# Patient Record
Sex: Male | Born: 1940 | Race: White | Hispanic: No | Marital: Married | Smoking: Former smoker
Health system: Southern US, Community
[De-identification: ages and names within clinical notes are randomized; demographics above are authoritative.]

## PROBLEM LIST (undated history)

## (undated) DIAGNOSIS — I2510 Atherosclerotic heart disease of native coronary artery without angina pectoris: Secondary | ICD-10-CM

## (undated) DIAGNOSIS — I714 Abdominal aortic aneurysm, without rupture, unspecified: Secondary | ICD-10-CM

## (undated) DIAGNOSIS — I10 Essential (primary) hypertension: Secondary | ICD-10-CM

## (undated) DIAGNOSIS — E119 Type 2 diabetes mellitus without complications: Secondary | ICD-10-CM

## (undated) DIAGNOSIS — K567 Ileus, unspecified: Secondary | ICD-10-CM

## (undated) DIAGNOSIS — I4892 Unspecified atrial flutter: Secondary | ICD-10-CM

## (undated) DIAGNOSIS — N200 Calculus of kidney: Secondary | ICD-10-CM

## (undated) DIAGNOSIS — E785 Hyperlipidemia, unspecified: Secondary | ICD-10-CM

## (undated) DIAGNOSIS — I739 Peripheral vascular disease, unspecified: Secondary | ICD-10-CM

## (undated) DIAGNOSIS — G709 Myoneural disorder, unspecified: Secondary | ICD-10-CM

## (undated) DIAGNOSIS — I480 Paroxysmal atrial fibrillation: Secondary | ICD-10-CM

## (undated) DIAGNOSIS — M1990 Unspecified osteoarthritis, unspecified site: Secondary | ICD-10-CM

## (undated) DIAGNOSIS — G4733 Obstructive sleep apnea (adult) (pediatric): Secondary | ICD-10-CM

## (undated) DIAGNOSIS — I5042 Chronic combined systolic (congestive) and diastolic (congestive) heart failure: Secondary | ICD-10-CM

## (undated) DIAGNOSIS — I779 Disorder of arteries and arterioles, unspecified: Secondary | ICD-10-CM

## (undated) DIAGNOSIS — Z9989 Dependence on other enabling machines and devices: Secondary | ICD-10-CM

## (undated) HISTORY — DX: Essential (primary) hypertension: I10

## (undated) HISTORY — DX: Paroxysmal atrial fibrillation: I48.0

## (undated) HISTORY — DX: Myoneural disorder, unspecified: G70.9

## (undated) HISTORY — DX: Ileus, unspecified: K56.7

## (undated) HISTORY — DX: Calculus of kidney: N20.0

## (undated) HISTORY — DX: Atherosclerotic heart disease of native coronary artery without angina pectoris: I25.10

## (undated) HISTORY — DX: Hyperlipidemia, unspecified: E78.5

## (undated) HISTORY — DX: Abdominal aortic aneurysm, without rupture, unspecified: I71.40

## (undated) HISTORY — PX: SHOULDER SURGERY: SHX246

## (undated) HISTORY — DX: Unspecified atrial flutter: I48.92

## (undated) HISTORY — DX: Abdominal aortic aneurysm, without rupture: I71.4

## (undated) HISTORY — DX: Chronic combined systolic (congestive) and diastolic (congestive) heart failure: I50.42

## (undated) HISTORY — PX: TONSILLECTOMY: SUR1361

---

## 1998-02-19 ENCOUNTER — Ambulatory Visit (HOSPITAL_COMMUNITY): Admission: RE | Admit: 1998-02-19 | Discharge: 1998-02-19 | Payer: Self-pay | Admitting: Gastroenterology

## 1999-06-22 ENCOUNTER — Emergency Department (HOSPITAL_COMMUNITY): Admission: EM | Admit: 1999-06-22 | Discharge: 1999-06-22 | Payer: Self-pay | Admitting: Emergency Medicine

## 2000-04-14 ENCOUNTER — Ambulatory Visit (HOSPITAL_COMMUNITY): Admission: EM | Admit: 2000-04-14 | Discharge: 2000-04-14 | Payer: Self-pay | Admitting: *Deleted

## 2000-10-01 ENCOUNTER — Emergency Department (HOSPITAL_COMMUNITY): Admission: EM | Admit: 2000-10-01 | Discharge: 2000-10-01 | Payer: Self-pay | Admitting: Emergency Medicine

## 2000-10-01 ENCOUNTER — Encounter: Payer: Self-pay | Admitting: Emergency Medicine

## 2000-10-02 ENCOUNTER — Inpatient Hospital Stay (HOSPITAL_COMMUNITY): Admission: EM | Admit: 2000-10-02 | Discharge: 2000-10-03 | Payer: Self-pay | Admitting: Emergency Medicine

## 2000-10-02 ENCOUNTER — Encounter: Payer: Self-pay | Admitting: Urology

## 2000-10-06 ENCOUNTER — Emergency Department (HOSPITAL_COMMUNITY): Admission: EM | Admit: 2000-10-06 | Discharge: 2000-10-06 | Payer: Self-pay | Admitting: Emergency Medicine

## 2000-10-14 ENCOUNTER — Ambulatory Visit (HOSPITAL_COMMUNITY): Admission: RE | Admit: 2000-10-14 | Discharge: 2000-10-14 | Payer: Self-pay | Admitting: Urology

## 2000-10-14 ENCOUNTER — Encounter: Payer: Self-pay | Admitting: Urology

## 2000-10-17 ENCOUNTER — Ambulatory Visit (HOSPITAL_COMMUNITY): Admission: RE | Admit: 2000-10-17 | Discharge: 2000-10-17 | Payer: Self-pay | Admitting: Urology

## 2000-10-17 ENCOUNTER — Encounter: Payer: Self-pay | Admitting: Urology

## 2001-05-10 ENCOUNTER — Ambulatory Visit (HOSPITAL_COMMUNITY): Admission: RE | Admit: 2001-05-10 | Discharge: 2001-05-10 | Payer: Self-pay | Admitting: Gastroenterology

## 2001-05-10 ENCOUNTER — Encounter (INDEPENDENT_AMBULATORY_CARE_PROVIDER_SITE_OTHER): Payer: Self-pay

## 2002-06-08 ENCOUNTER — Encounter: Payer: Self-pay | Admitting: Urology

## 2002-06-08 ENCOUNTER — Encounter: Admission: RE | Admit: 2002-06-08 | Discharge: 2002-06-08 | Payer: Self-pay | Admitting: Urology

## 2002-06-13 ENCOUNTER — Encounter: Admission: RE | Admit: 2002-06-13 | Discharge: 2002-06-13 | Payer: Self-pay | Admitting: Urology

## 2002-06-13 ENCOUNTER — Encounter: Payer: Self-pay | Admitting: Urology

## 2002-06-19 ENCOUNTER — Emergency Department (HOSPITAL_COMMUNITY): Admission: EM | Admit: 2002-06-19 | Discharge: 2002-06-20 | Payer: Self-pay | Admitting: Emergency Medicine

## 2002-06-22 ENCOUNTER — Ambulatory Visit (HOSPITAL_BASED_OUTPATIENT_CLINIC_OR_DEPARTMENT_OTHER): Admission: RE | Admit: 2002-06-22 | Discharge: 2002-06-22 | Payer: Self-pay | Admitting: Urology

## 2003-08-16 ENCOUNTER — Emergency Department (HOSPITAL_COMMUNITY): Admission: EM | Admit: 2003-08-16 | Discharge: 2003-08-16 | Payer: Self-pay | Admitting: Emergency Medicine

## 2004-10-11 HISTORY — PX: CARPAL TUNNEL RELEASE: SHX101

## 2006-10-31 ENCOUNTER — Ambulatory Visit: Payer: Self-pay | Admitting: Internal Medicine

## 2006-10-31 ENCOUNTER — Inpatient Hospital Stay (HOSPITAL_COMMUNITY): Admission: EM | Admit: 2006-10-31 | Discharge: 2006-11-04 | Payer: Self-pay | Admitting: Emergency Medicine

## 2006-11-01 ENCOUNTER — Encounter (INDEPENDENT_AMBULATORY_CARE_PROVIDER_SITE_OTHER): Payer: Self-pay | Admitting: Interventional Cardiology

## 2006-11-03 HISTORY — PX: ATRIAL FLUTTER ABLATION: SHX5733

## 2006-11-23 ENCOUNTER — Encounter: Admission: RE | Admit: 2006-11-23 | Discharge: 2006-11-23 | Payer: Self-pay | Admitting: Interventional Cardiology

## 2006-11-24 ENCOUNTER — Encounter: Admission: RE | Admit: 2006-11-24 | Discharge: 2006-11-24 | Payer: Self-pay | Admitting: Family Medicine

## 2006-12-07 ENCOUNTER — Ambulatory Visit: Payer: Self-pay | Admitting: Internal Medicine

## 2007-05-30 ENCOUNTER — Inpatient Hospital Stay (HOSPITAL_COMMUNITY): Admission: RE | Admit: 2007-05-30 | Discharge: 2007-05-31 | Payer: Self-pay | Admitting: Interventional Cardiology

## 2007-05-30 HISTORY — PX: CORONARY ANGIOPLASTY WITH STENT PLACEMENT: SHX49

## 2007-10-06 ENCOUNTER — Ambulatory Visit (HOSPITAL_COMMUNITY): Admission: RE | Admit: 2007-10-06 | Discharge: 2007-10-07 | Payer: Self-pay | Admitting: Interventional Cardiology

## 2007-10-06 HISTORY — PX: CORONARY ANGIOPLASTY WITH STENT PLACEMENT: SHX49

## 2007-10-12 HISTORY — PX: JOINT REPLACEMENT: SHX530

## 2007-11-12 HISTORY — PX: HIP FRACTURE SURGERY: SHX118

## 2008-02-27 ENCOUNTER — Encounter: Admission: RE | Admit: 2008-02-27 | Discharge: 2008-02-27 | Payer: Self-pay | Admitting: Sports Medicine

## 2008-09-27 ENCOUNTER — Encounter: Admission: RE | Admit: 2008-09-27 | Discharge: 2008-09-27 | Payer: Self-pay | Admitting: Sports Medicine

## 2008-11-13 ENCOUNTER — Inpatient Hospital Stay (HOSPITAL_COMMUNITY): Admission: RE | Admit: 2008-11-13 | Discharge: 2008-11-15 | Payer: Self-pay | Admitting: Orthopedic Surgery

## 2009-07-30 ENCOUNTER — Encounter: Admission: RE | Admit: 2009-07-30 | Discharge: 2009-07-30 | Payer: Self-pay | Admitting: Orthopedic Surgery

## 2009-08-28 ENCOUNTER — Encounter: Admission: RE | Admit: 2009-08-28 | Discharge: 2009-08-28 | Payer: Self-pay | Admitting: Orthopedic Surgery

## 2009-09-14 ENCOUNTER — Inpatient Hospital Stay (HOSPITAL_COMMUNITY): Admission: EM | Admit: 2009-09-14 | Discharge: 2009-09-18 | Payer: Self-pay | Admitting: Emergency Medicine

## 2009-10-07 ENCOUNTER — Encounter: Admission: RE | Admit: 2009-10-07 | Discharge: 2009-10-07 | Payer: Self-pay | Admitting: Orthopedic Surgery

## 2010-04-12 ENCOUNTER — Inpatient Hospital Stay (HOSPITAL_COMMUNITY): Admission: EM | Admit: 2010-04-12 | Discharge: 2010-04-16 | Payer: Self-pay | Admitting: Emergency Medicine

## 2010-04-12 ENCOUNTER — Encounter (INDEPENDENT_AMBULATORY_CARE_PROVIDER_SITE_OTHER): Payer: Self-pay

## 2010-04-23 ENCOUNTER — Encounter: Admission: RE | Admit: 2010-04-23 | Discharge: 2010-04-23 | Payer: Self-pay | Admitting: Orthopedic Surgery

## 2010-05-07 ENCOUNTER — Ambulatory Visit (HOSPITAL_COMMUNITY): Admission: RE | Admit: 2010-05-07 | Discharge: 2010-05-07 | Payer: Self-pay | Admitting: Orthopedic Surgery

## 2010-05-11 ENCOUNTER — Inpatient Hospital Stay (HOSPITAL_COMMUNITY)
Admission: EM | Admit: 2010-05-11 | Discharge: 2010-05-19 | Payer: Self-pay | Source: Home / Self Care | Admitting: Emergency Medicine

## 2010-05-15 ENCOUNTER — Encounter (INDEPENDENT_AMBULATORY_CARE_PROVIDER_SITE_OTHER): Payer: Self-pay | Admitting: Internal Medicine

## 2010-10-07 ENCOUNTER — Inpatient Hospital Stay (HOSPITAL_COMMUNITY)
Admission: AD | Admit: 2010-10-07 | Discharge: 2010-10-10 | Payer: Self-pay | Source: Home / Self Care | Attending: Internal Medicine | Admitting: Internal Medicine

## 2010-11-04 ENCOUNTER — Other Ambulatory Visit (HOSPITAL_COMMUNITY): Payer: Self-pay | Admitting: Orthopedic Surgery

## 2010-11-04 DIAGNOSIS — M25569 Pain in unspecified knee: Secondary | ICD-10-CM

## 2010-11-04 DIAGNOSIS — M25559 Pain in unspecified hip: Secondary | ICD-10-CM

## 2010-11-16 ENCOUNTER — Ambulatory Visit (HOSPITAL_COMMUNITY)
Admission: RE | Admit: 2010-11-16 | Discharge: 2010-11-16 | Disposition: A | Discharge status: Home / Self Care | Payer: Medicare Other | Source: Ambulatory Visit | Attending: Orthopedic Surgery | Admitting: Orthopedic Surgery

## 2010-11-16 ENCOUNTER — Other Ambulatory Visit (HOSPITAL_COMMUNITY): Payer: Self-pay

## 2010-11-16 ENCOUNTER — Encounter (HOSPITAL_COMMUNITY): Payer: Self-pay

## 2010-11-16 DIAGNOSIS — M25559 Pain in unspecified hip: Secondary | ICD-10-CM

## 2010-11-16 MED ORDER — TECHNETIUM TC 99M MEDRONATE IV KIT
25.0000 | PACK | Freq: Once | INTRAVENOUS | Status: AC | PRN
  Administered 2010-11-16: 25 via INTRAVENOUS

## 2010-12-07 ENCOUNTER — Other Ambulatory Visit: Payer: Self-pay | Admitting: Orthopedic Surgery

## 2010-12-07 DIAGNOSIS — N2889 Other specified disorders of kidney and ureter: Secondary | ICD-10-CM

## 2010-12-08 ENCOUNTER — Ambulatory Visit
Admission: RE | Admit: 2010-12-08 | Discharge: 2010-12-08 | Disposition: A | Discharge status: Home / Self Care | Payer: BC Managed Care – PPO | Source: Ambulatory Visit | Attending: Orthopedic Surgery | Admitting: Orthopedic Surgery

## 2010-12-08 DIAGNOSIS — N2889 Other specified disorders of kidney and ureter: Secondary | ICD-10-CM

## 2010-12-21 LAB — COMPREHENSIVE METABOLIC PANEL
ALT: 57 U/L — ABNORMAL HIGH (ref 0–53)
AST: 26 U/L (ref 0–37)
Albumin: 4.2 g/dL (ref 3.5–5.2)
Albumin: 4.6 g/dL (ref 3.5–5.2)
Alkaline Phosphatase: 47 U/L (ref 39–117)
Alkaline Phosphatase: 56 U/L (ref 39–117)
BUN: 35 mg/dL — ABNORMAL HIGH (ref 6–23)
CO2: 19 mEq/L (ref 19–32)
Chloride: 105 mEq/L (ref 96–112)
GFR calc Af Amer: 60 mL/min — ABNORMAL LOW (ref >60)
GFR calc non Af Amer: 47 mL/min — ABNORMAL LOW (ref >60)
Potassium: 3.5 mEq/L (ref 3.5–5.1)
Potassium: 4 mEq/L (ref 3.5–5.1)
Sodium: 137 mEq/L (ref 135–145)
Total Bilirubin: 1.5 mg/dL — ABNORMAL HIGH (ref 0.3–1.2)
Total Protein: 8.3 g/dL (ref 6.0–8.3)

## 2010-12-21 LAB — AFB CULTURE WITH SMEAR (NOT AT ARMC): Acid Fast Smear: NONE SEEN

## 2010-12-21 LAB — CBC
Hemoglobin: 15.3 g/dL (ref 13.0–17.0)
MCH: 32.4 pg (ref 26.0–34.0)
MCV: 92.4 fL (ref 78.0–100.0)
Platelets: 162 10*3/uL (ref 150–400)
Platelets: 171 10*3/uL (ref 150–400)
RBC: 4.72 MIL/uL (ref 4.22–5.81)
RDW: 12.2 % (ref 11.5–15.5)
WBC: 8.8 10*3/uL (ref 4.0–10.5)
WBC: 9.4 10*3/uL (ref 4.0–10.5)

## 2010-12-21 LAB — CORTISOL: Cortisol, Plasma: 12 ug/dL

## 2010-12-21 LAB — GLUCOSE, CAPILLARY
Glucose-Capillary: 117 mg/dL — ABNORMAL HIGH (ref 70–99)
Glucose-Capillary: 121 mg/dL — ABNORMAL HIGH (ref 70–99)
Glucose-Capillary: 144 mg/dL — ABNORMAL HIGH (ref 70–99)
Glucose-Capillary: 155 mg/dL — ABNORMAL HIGH (ref 70–99)
Glucose-Capillary: 161 mg/dL — ABNORMAL HIGH (ref 70–99)
Glucose-Capillary: 175 mg/dL — ABNORMAL HIGH (ref 70–99)
Glucose-Capillary: 182 mg/dL — ABNORMAL HIGH (ref 70–99)
Glucose-Capillary: 215 mg/dL — ABNORMAL HIGH (ref 70–99)
Glucose-Capillary: 259 mg/dL — ABNORMAL HIGH (ref 70–99)

## 2010-12-21 LAB — URINALYSIS, MICROSCOPIC ONLY
Ketones, ur: 15 mg/dL — AB
Leukocytes, UA: NEGATIVE
Nitrite: POSITIVE — AB
Specific Gravity, Urine: 1.033 — ABNORMAL HIGH (ref 1.005–1.030)
Urobilinogen, UA: 0.2 mg/dL (ref 0.0–1.0)
pH: 5 (ref 5.0–8.0)

## 2010-12-21 LAB — URINE CULTURE: Colony Count: 10000

## 2010-12-21 LAB — STOOL CULTURE

## 2010-12-21 LAB — BASIC METABOLIC PANEL
BUN: 11 mg/dL (ref 6–23)
CO2: 23 mEq/L (ref 19–32)
CO2: 26 mEq/L (ref 19–32)
Chloride: 111 mEq/L (ref 96–112)
Chloride: 111 mEq/L (ref 96–112)
GFR calc Af Amer: >60 mL/min (ref >60)
Glucose, Bld: 129 mg/dL — ABNORMAL HIGH (ref 70–99)
Potassium: 4.1 mEq/L (ref 3.5–5.1)
Sodium: 138 mEq/L (ref 135–145)

## 2010-12-21 LAB — CHROMOGRANIN A: Chromogranin A: 10 ng/mL (ref 1.9–15.0)

## 2010-12-21 LAB — VASOACTIVE INTESTINAL PEPTIDE (VIP)

## 2010-12-25 LAB — COMPREHENSIVE METABOLIC PANEL
ALT: 31 U/L (ref 0–53)
AST: 19 U/L (ref 0–37)
AST: 23 U/L (ref 0–37)
Albumin: 3.9 g/dL (ref 3.5–5.2)
Albumin: 4.5 g/dL (ref 3.5–5.2)
Alkaline Phosphatase: 71 U/L (ref 39–117)
Calcium: 8.8 mg/dL (ref 8.4–10.5)
Calcium: 9.5 mg/dL (ref 8.4–10.5)
Creatinine, Ser: 3.41 mg/dL — ABNORMAL HIGH (ref 0.4–1.5)
GFR calc Af Amer: 15 mL/min — ABNORMAL LOW (ref >60)
GFR calc Af Amer: 22 mL/min — ABNORMAL LOW (ref >60)
Glucose, Bld: 427 mg/dL — ABNORMAL HIGH (ref 70–99)
Potassium: 4.4 mEq/L (ref 3.5–5.1)
Sodium: 131 mEq/L — ABNORMAL LOW (ref 135–145)
Total Protein: 7.2 g/dL (ref 6.0–8.3)
Total Protein: 8.5 g/dL — ABNORMAL HIGH (ref 6.0–8.3)

## 2010-12-25 LAB — GLUCOSE, CAPILLARY
Glucose-Capillary: 105 mg/dL — ABNORMAL HIGH (ref 70–99)
Glucose-Capillary: 129 mg/dL — ABNORMAL HIGH (ref 70–99)
Glucose-Capillary: 140 mg/dL — ABNORMAL HIGH (ref 70–99)
Glucose-Capillary: 145 mg/dL — ABNORMAL HIGH (ref 70–99)
Glucose-Capillary: 152 mg/dL — ABNORMAL HIGH (ref 70–99)
Glucose-Capillary: 154 mg/dL — ABNORMAL HIGH (ref 70–99)
Glucose-Capillary: 160 mg/dL — ABNORMAL HIGH (ref 70–99)
Glucose-Capillary: 165 mg/dL — ABNORMAL HIGH (ref 70–99)
Glucose-Capillary: 165 mg/dL — ABNORMAL HIGH (ref 70–99)
Glucose-Capillary: 182 mg/dL — ABNORMAL HIGH (ref 70–99)
Glucose-Capillary: 182 mg/dL — ABNORMAL HIGH (ref 70–99)
Glucose-Capillary: 183 mg/dL — ABNORMAL HIGH (ref 70–99)
Glucose-Capillary: 183 mg/dL — ABNORMAL HIGH (ref 70–99)
Glucose-Capillary: 184 mg/dL — ABNORMAL HIGH (ref 70–99)
Glucose-Capillary: 199 mg/dL — ABNORMAL HIGH (ref 70–99)
Glucose-Capillary: 209 mg/dL — ABNORMAL HIGH (ref 70–99)
Glucose-Capillary: 213 mg/dL — ABNORMAL HIGH (ref 70–99)
Glucose-Capillary: 226 mg/dL — ABNORMAL HIGH (ref 70–99)
Glucose-Capillary: 287 mg/dL — ABNORMAL HIGH (ref 70–99)
Glucose-Capillary: 312 mg/dL — ABNORMAL HIGH (ref 70–99)
Glucose-Capillary: 331 mg/dL — ABNORMAL HIGH (ref 70–99)
Glucose-Capillary: 446 mg/dL — ABNORMAL HIGH (ref 70–99)

## 2010-12-25 LAB — BASIC METABOLIC PANEL WITH GFR
BUN: 2 mg/dL — ABNORMAL LOW (ref 6–23)
BUN: 5 mg/dL — ABNORMAL LOW (ref 6–23)
BUN: 9 mg/dL (ref 6–23)
BUN: 9 mg/dL (ref 6–23)
CO2: 26 meq/L (ref 19–32)
CO2: 26 meq/L (ref 19–32)
CO2: 27 meq/L (ref 19–32)
CO2: 29 meq/L (ref 19–32)
Calcium: 8.4 mg/dL (ref 8.4–10.5)
Calcium: 8.4 mg/dL (ref 8.4–10.5)
Calcium: 9 mg/dL (ref 8.4–10.5)
Calcium: 9.3 mg/dL (ref 8.4–10.5)
Chloride: 101 meq/L (ref 96–112)
Chloride: 103 meq/L (ref 96–112)
Chloride: 105 meq/L (ref 96–112)
Chloride: 112 meq/L (ref 96–112)
Creatinine, Ser: 0.77 mg/dL (ref 0.4–1.5)
Creatinine, Ser: 0.83 mg/dL (ref 0.4–1.5)
Creatinine, Ser: 0.86 mg/dL (ref 0.4–1.5)
Creatinine, Ser: 0.98 mg/dL (ref 0.4–1.5)
GFR calc non Af Amer: >60 mL/min
GFR calc non Af Amer: >60 mL/min
GFR calc non Af Amer: >60 mL/min
GFR calc non Af Amer: >60 mL/min
Glucose, Bld: 100 mg/dL — ABNORMAL HIGH (ref 70–99)
Glucose, Bld: 141 mg/dL — ABNORMAL HIGH (ref 70–99)
Glucose, Bld: 158 mg/dL — ABNORMAL HIGH (ref 70–99)
Glucose, Bld: 173 mg/dL — ABNORMAL HIGH (ref 70–99)
Potassium: 2.6 meq/L — CL (ref 3.5–5.1)
Potassium: 3.3 meq/L — ABNORMAL LOW (ref 3.5–5.1)
Potassium: 3.9 meq/L (ref 3.5–5.1)
Potassium: 4.1 meq/L (ref 3.5–5.1)
Sodium: 138 meq/L (ref 135–145)
Sodium: 141 meq/L (ref 135–145)
Sodium: 142 meq/L (ref 135–145)
Sodium: 143 meq/L (ref 135–145)

## 2010-12-25 LAB — NA AND K (SODIUM & POTASSIUM), RAND UR: Sodium, Ur: <10 mEq/L

## 2010-12-25 LAB — DIFFERENTIAL
Basophils Absolute: 0 10*3/uL (ref 0.0–0.1)
Basophils Relative: 0 % (ref 0–1)
Eosinophils Relative: 3 % (ref 0–5)
Lymphocytes Relative: 16 % (ref 12–46)
Lymphocytes Relative: 25 % (ref 12–46)
Lymphs Abs: 1.7 10*3/uL (ref 0.7–4.0)
Monocytes Absolute: 1.2 10*3/uL — ABNORMAL HIGH (ref 0.1–1.0)
Monocytes Relative: 16 % — ABNORMAL HIGH (ref 3–12)
Neutro Abs: 3.9 10*3/uL (ref 1.7–7.7)
Neutro Abs: 7.6 10*3/uL (ref 1.7–7.7)
Neutrophils Relative %: 55 % (ref 43–77)

## 2010-12-25 LAB — URINE CULTURE
Colony Count: 2000
Culture  Setup Time: 201108012033

## 2010-12-25 LAB — CBC
HCT: 33.3 % — ABNORMAL LOW (ref 39.0–52.0)
HCT: 33.6 % — ABNORMAL LOW (ref 39.0–52.0)
HCT: 35.5 % — ABNORMAL LOW (ref 39.0–52.0)
HCT: 38 % — ABNORMAL LOW (ref 39.0–52.0)
HCT: 40.5 % (ref 39.0–52.0)
HCT: 43.4 % (ref 39.0–52.0)
Hemoglobin: 11.6 g/dL — ABNORMAL LOW (ref 13.0–17.0)
Hemoglobin: 12.1 g/dL — ABNORMAL LOW (ref 13.0–17.0)
Hemoglobin: 12.6 g/dL — ABNORMAL LOW (ref 13.0–17.0)
Hemoglobin: 13.1 g/dL (ref 13.0–17.0)
Hemoglobin: 14.7 g/dL (ref 13.0–17.0)
Hemoglobin: 15.5 g/dL (ref 13.0–17.0)
MCH: 31.4 pg (ref 26.0–34.0)
MCH: 31.7 pg (ref 26.0–34.0)
MCH: 32.1 pg (ref 26.0–34.0)
MCH: 32.3 pg (ref 26.0–34.0)
MCH: 32.4 pg (ref 26.0–34.0)
MCH: 32.6 pg (ref 26.0–34.0)
MCH: 32.8 pg (ref 26.0–34.0)
MCHC: 34.5 g/dL (ref 30.0–36.0)
MCHC: 34.8 g/dL (ref 30.0–36.0)
MCHC: 35.5 g/dL (ref 30.0–36.0)
MCHC: 35.7 g/dL (ref 30.0–36.0)
MCHC: 36 g/dL (ref 30.0–36.0)
MCHC: 36.3 g/dL — ABNORMAL HIGH (ref 30.0–36.0)
MCHC: 36.4 g/dL — ABNORMAL HIGH (ref 30.0–36.0)
MCV: 89.1 fL (ref 78.0–100.0)
MCV: 89.4 fL (ref 78.0–100.0)
MCV: 89.8 fL (ref 78.0–100.0)
MCV: 90 fL (ref 78.0–100.0)
MCV: 90.8 fL (ref 78.0–100.0)
MCV: 93.6 fL (ref 78.0–100.0)
Platelets: 110 10*3/uL — ABNORMAL LOW (ref 150–400)
Platelets: 120 10*3/uL — ABNORMAL LOW (ref 150–400)
Platelets: 125 10*3/uL — ABNORMAL LOW (ref 150–400)
Platelets: 136 10*3/uL — ABNORMAL LOW (ref 150–400)
Platelets: 150 10*3/uL (ref 150–400)
Platelets: 155 10*3/uL (ref 150–400)
Platelets: 171 10*3/uL (ref 150–400)
RBC: 3.7 MIL/uL — ABNORMAL LOW (ref 4.22–5.81)
RBC: 3.77 MIL/uL — ABNORMAL LOW (ref 4.22–5.81)
RBC: 3.97 MIL/uL — ABNORMAL LOW (ref 4.22–5.81)
RBC: 4.06 MIL/uL — ABNORMAL LOW (ref 4.22–5.81)
RBC: 4.51 MIL/uL (ref 4.22–5.81)
RBC: 4.78 MIL/uL (ref 4.22–5.81)
RBC: 5.3 MIL/uL (ref 4.22–5.81)
RDW: 12.6 % (ref 11.5–15.5)
RDW: 12.8 % (ref 11.5–15.5)
RDW: 12.8 % (ref 11.5–15.5)
RDW: 12.8 % (ref 11.5–15.5)
RDW: 12.9 % (ref 11.5–15.5)
RDW: 13.7 % (ref 11.5–15.5)
WBC: 5.1 10*3/uL (ref 4.0–10.5)
WBC: 5.4 10*3/uL (ref 4.0–10.5)
WBC: 5.5 10*3/uL (ref 4.0–10.5)
WBC: 6.4 10*3/uL (ref 4.0–10.5)
WBC: 7.1 10*3/uL (ref 4.0–10.5)
WBC: 9.7 10*3/uL (ref 4.0–10.5)

## 2010-12-25 LAB — STOOL CULTURE

## 2010-12-25 LAB — BASIC METABOLIC PANEL
BUN: 13 mg/dL (ref 6–23)
BUN: 2 mg/dL — ABNORMAL LOW (ref 6–23)
CO2: 28 mEq/L (ref 19–32)
Calcium: 8.9 mg/dL (ref 8.4–10.5)
Chloride: 103 mEq/L (ref 96–112)
Chloride: 109 mEq/L (ref 96–112)
GFR calc Af Amer: >60 mL/min (ref >60)
GFR calc Af Amer: >60 mL/min (ref >60)
GFR calc non Af Amer: >60 mL/min (ref >60)
Glucose, Bld: 125 mg/dL — ABNORMAL HIGH (ref 70–99)
Potassium: 2.8 mEq/L — ABNORMAL LOW (ref 3.5–5.1)
Potassium: 2.9 mEq/L — ABNORMAL LOW (ref 3.5–5.1)
Sodium: 138 mEq/L (ref 135–145)
Sodium: 139 mEq/L (ref 135–145)

## 2010-12-25 LAB — CLOSTRIDIUM DIFFICILE EIA
C difficile Toxins A+B, EIA: NEGATIVE
C difficile Toxins A+B, EIA: NEGATIVE
C difficile Toxins A+B, EIA: NEGATIVE

## 2010-12-25 LAB — POCT I-STAT, CHEM 8
BUN: 48 mg/dL — ABNORMAL HIGH (ref 6–23)
Calcium, Ion: 1.11 mmol/L — ABNORMAL LOW (ref 1.12–1.32)
Chloride: 102 mEq/L (ref 96–112)
Glucose, Bld: 419 mg/dL — ABNORMAL HIGH (ref 70–99)
Potassium: 4.3 mEq/L (ref 3.5–5.1)

## 2010-12-25 LAB — COMPREHENSIVE METABOLIC PANEL WITH GFR
ALT: 26 U/L (ref 0–53)
AST: 18 U/L (ref 0–37)
Albumin: 3.6 g/dL (ref 3.5–5.2)
Alkaline Phosphatase: 71 U/L (ref 39–117)
BUN: 37 mg/dL — ABNORMAL HIGH (ref 6–23)
CO2: 19 meq/L (ref 19–32)
Calcium: 8.3 mg/dL — ABNORMAL LOW (ref 8.4–10.5)
Chloride: 105 meq/L (ref 96–112)
Creatinine, Ser: 1.84 mg/dL — ABNORMAL HIGH (ref 0.4–1.5)
GFR calc non Af Amer: 37 mL/min — ABNORMAL LOW
Glucose, Bld: 201 mg/dL — ABNORMAL HIGH (ref 70–99)
Potassium: 3.1 meq/L — ABNORMAL LOW (ref 3.5–5.1)
Sodium: 134 meq/L — ABNORMAL LOW (ref 135–145)
Total Bilirubin: 1 mg/dL (ref 0.3–1.2)
Total Protein: 6.9 g/dL (ref 6.0–8.3)

## 2010-12-25 LAB — MAGNESIUM
Magnesium: 1.4 mg/dL — ABNORMAL LOW (ref 1.5–2.5)
Magnesium: 1.7 mg/dL (ref 1.5–2.5)
Magnesium: 1.8 mg/dL (ref 1.5–2.5)

## 2010-12-25 LAB — FECAL FAT, QUALITATIVE
Free fatty acids: NORMAL
Neutral Fat: NORMAL

## 2010-12-25 LAB — URINALYSIS, ROUTINE W REFLEX MICROSCOPIC
Glucose, UA: >1000 mg/dL — AB
Protein, ur: 100 mg/dL — AB

## 2010-12-25 LAB — URINE MICROSCOPIC-ADD ON

## 2010-12-25 LAB — HEMOCCULT GUIAC POC 1CARD (OFFICE): Fecal Occult Bld: NEGATIVE

## 2010-12-25 LAB — LIPASE, BLOOD: Lipase: 26 U/L (ref 11–59)

## 2010-12-27 LAB — LIPASE, BLOOD: Lipase: 21 U/L (ref 11–59)

## 2010-12-27 LAB — CBC
HCT: 38.5 % — ABNORMAL LOW (ref 39.0–52.0)
HCT: 47 % (ref 39.0–52.0)
Hemoglobin: 16.3 g/dL (ref 13.0–17.0)
MCH: 33.5 pg (ref 26.0–34.0)
MCHC: 34.8 g/dL (ref 30.0–36.0)
MCHC: 35.2 g/dL (ref 30.0–36.0)
MCV: 95.7 fL (ref 78.0–100.0)
MCV: 95.9 fL (ref 78.0–100.0)
Platelets: 106 10*3/uL — ABNORMAL LOW (ref 150–400)
RBC: 3.91 MIL/uL — ABNORMAL LOW (ref 4.22–5.81)
RDW: 12.9 % (ref 11.5–15.5)
RDW: 13 % (ref 11.5–15.5)
WBC: 8 10*3/uL (ref 4.0–10.5)

## 2010-12-27 LAB — COMPREHENSIVE METABOLIC PANEL
ALT: 36 U/L (ref 0–53)
ALT: 50 U/L (ref 0–53)
AST: 25 U/L (ref 0–37)
Albumin: 3.2 g/dL — ABNORMAL LOW (ref 3.5–5.2)
Alkaline Phosphatase: 64 U/L (ref 39–117)
BUN: 34 mg/dL — ABNORMAL HIGH (ref 6–23)
CO2: 22 mEq/L (ref 19–32)
Calcium: 9.5 mg/dL (ref 8.4–10.5)
Chloride: 109 mEq/L (ref 96–112)
Creatinine, Ser: 1.1 mg/dL (ref 0.4–1.5)
GFR calc Af Amer: >60 mL/min (ref >60)
GFR calc non Af Amer: 40 mL/min — ABNORMAL LOW (ref >60)
Glucose, Bld: 301 mg/dL — ABNORMAL HIGH (ref 70–99)
Potassium: 4 mEq/L (ref 3.5–5.1)
Potassium: 4.1 mEq/L (ref 3.5–5.1)
Sodium: 136 mEq/L (ref 135–145)
Sodium: 139 mEq/L (ref 135–145)
Total Bilirubin: 0.7 mg/dL (ref 0.3–1.2)
Total Protein: 7.8 g/dL (ref 6.0–8.3)

## 2010-12-27 LAB — GLUCOSE, CAPILLARY
Glucose-Capillary: 155 mg/dL — ABNORMAL HIGH (ref 70–99)
Glucose-Capillary: 178 mg/dL — ABNORMAL HIGH (ref 70–99)
Glucose-Capillary: 188 mg/dL — ABNORMAL HIGH (ref 70–99)
Glucose-Capillary: 191 mg/dL — ABNORMAL HIGH (ref 70–99)
Glucose-Capillary: 192 mg/dL — ABNORMAL HIGH (ref 70–99)
Glucose-Capillary: 206 mg/dL — ABNORMAL HIGH (ref 70–99)
Glucose-Capillary: 227 mg/dL — ABNORMAL HIGH (ref 70–99)
Glucose-Capillary: 238 mg/dL — ABNORMAL HIGH (ref 70–99)
Glucose-Capillary: 241 mg/dL — ABNORMAL HIGH (ref 70–99)
Glucose-Capillary: 256 mg/dL — ABNORMAL HIGH (ref 70–99)
Glucose-Capillary: 266 mg/dL — ABNORMAL HIGH (ref 70–99)

## 2010-12-27 LAB — CLOSTRIDIUM DIFFICILE EIA: C difficile Toxins A+B, EIA: NEGATIVE

## 2010-12-27 LAB — SODIUM, URINE, RANDOM: Sodium, Ur: 31 mEq/L

## 2010-12-27 LAB — HEMOGLOBIN A1C
Hgb A1c MFr Bld: 8.5 % — ABNORMAL HIGH (ref <5.7)
Mean Plasma Glucose: 197 mg/dL — ABNORMAL HIGH (ref <117)

## 2010-12-27 LAB — DIFFERENTIAL
Basophils Relative: 0 % (ref 0–1)
Eosinophils Absolute: 0.1 10*3/uL (ref 0.0–0.7)
Monocytes Relative: 6 % (ref 3–12)
Neutro Abs: 9.7 10*3/uL — ABNORMAL HIGH (ref 1.7–7.7)
Neutrophils Relative %: 82 % — ABNORMAL HIGH (ref 43–77)

## 2010-12-27 LAB — CULTURE, BLOOD (ROUTINE X 2): Culture: NO GROWTH

## 2010-12-27 LAB — STOOL CULTURE

## 2010-12-27 LAB — LACTIC ACID, PLASMA: Lactic Acid, Venous: 2.4 mmol/L — ABNORMAL HIGH (ref 0.5–2.2)

## 2010-12-27 LAB — URINALYSIS, ROUTINE W REFLEX MICROSCOPIC
Bilirubin Urine: NEGATIVE
Glucose, UA: 100 mg/dL — AB
Hgb urine dipstick: NEGATIVE
Ketones, ur: NEGATIVE mg/dL
Protein, ur: 30 mg/dL — AB
pH: 5 (ref 5.0–8.0)

## 2010-12-27 LAB — BASIC METABOLIC PANEL
BUN: 7 mg/dL (ref 6–23)
CO2: 28 mEq/L (ref 19–32)
Chloride: 105 mEq/L (ref 96–112)
Glucose, Bld: 259 mg/dL — ABNORMAL HIGH (ref 70–99)
Potassium: 3.6 mEq/L (ref 3.5–5.1)

## 2010-12-27 LAB — URINE MICROSCOPIC-ADD ON

## 2011-01-12 LAB — BASIC METABOLIC PANEL
BUN: 8 mg/dL (ref 6–23)
CO2: 16 mEq/L — ABNORMAL LOW (ref 19–32)
CO2: 26 mEq/L (ref 19–32)
Calcium: 8.3 mg/dL — ABNORMAL LOW (ref 8.4–10.5)
Calcium: 8.5 mg/dL (ref 8.4–10.5)
Chloride: 109 mEq/L (ref 96–112)
Chloride: 111 mEq/L (ref 96–112)
Chloride: 114 mEq/L — ABNORMAL HIGH (ref 96–112)
Creatinine, Ser: 0.94 mg/dL (ref 0.4–1.5)
Creatinine, Ser: 1.15 mg/dL (ref 0.4–1.5)
GFR calc Af Amer: >60 mL/min (ref >60)
GFR calc Af Amer: >60 mL/min (ref >60)
GFR calc non Af Amer: >60 mL/min (ref >60)
Glucose, Bld: 106 mg/dL — ABNORMAL HIGH (ref 70–99)
Potassium: 3.2 mEq/L — ABNORMAL LOW (ref 3.5–5.1)
Potassium: 3.2 mEq/L — ABNORMAL LOW (ref 3.5–5.1)
Sodium: 137 mEq/L (ref 135–145)
Sodium: 143 mEq/L (ref 135–145)

## 2011-01-12 LAB — GLUCOSE, CAPILLARY
Glucose-Capillary: 108 mg/dL — ABNORMAL HIGH (ref 70–99)
Glucose-Capillary: 115 mg/dL — ABNORMAL HIGH (ref 70–99)
Glucose-Capillary: 124 mg/dL — ABNORMAL HIGH (ref 70–99)
Glucose-Capillary: 124 mg/dL — ABNORMAL HIGH (ref 70–99)
Glucose-Capillary: 136 mg/dL — ABNORMAL HIGH (ref 70–99)
Glucose-Capillary: 146 mg/dL — ABNORMAL HIGH (ref 70–99)
Glucose-Capillary: 161 mg/dL — ABNORMAL HIGH (ref 70–99)
Glucose-Capillary: 180 mg/dL — ABNORMAL HIGH (ref 70–99)

## 2011-01-12 LAB — CLOSTRIDIUM DIFFICILE EIA: C difficile Toxins A+B, EIA: NEGATIVE

## 2011-01-12 LAB — CBC
HCT: 32.6 % — ABNORMAL LOW (ref 39.0–52.0)
HCT: 45.8 % (ref 39.0–52.0)
Hemoglobin: 11.3 g/dL — ABNORMAL LOW (ref 13.0–17.0)
Hemoglobin: 12.8 g/dL — ABNORMAL LOW (ref 13.0–17.0)
Hemoglobin: 12.9 g/dL — ABNORMAL LOW (ref 13.0–17.0)
Hemoglobin: 15.5 g/dL (ref 13.0–17.0)
MCHC: 33.8 g/dL (ref 30.0–36.0)
MCHC: 34.5 g/dL (ref 30.0–36.0)
MCHC: 34.8 g/dL (ref 30.0–36.0)
MCV: 94.3 fL (ref 78.0–100.0)
MCV: 95.1 fL (ref 78.0–100.0)
RBC: 3.42 MIL/uL — ABNORMAL LOW (ref 4.22–5.81)
RBC: 3.94 MIL/uL — ABNORMAL LOW (ref 4.22–5.81)
RDW: 12.5 % (ref 11.5–15.5)
RDW: 12.6 % (ref 11.5–15.5)
WBC: 4.8 10*3/uL (ref 4.0–10.5)

## 2011-01-12 LAB — FECAL LACTOFERRIN, QUANT: Fecal Lactoferrin: POSITIVE

## 2011-01-12 LAB — COMPREHENSIVE METABOLIC PANEL
Alkaline Phosphatase: 69 U/L (ref 39–117)
BUN: 72 mg/dL — ABNORMAL HIGH (ref 6–23)
GFR calc non Af Amer: 28 mL/min — ABNORMAL LOW (ref >60)
Glucose, Bld: 281 mg/dL — ABNORMAL HIGH (ref 70–99)
Potassium: 3.6 mEq/L (ref 3.5–5.1)
Total Protein: 8.8 g/dL — ABNORMAL HIGH (ref 6.0–8.3)

## 2011-01-12 LAB — URINE CULTURE: Colony Count: 6000

## 2011-01-12 LAB — KETONES, QUALITATIVE: Acetone, Bld: NEGATIVE

## 2011-01-12 LAB — DIFFERENTIAL
Basophils Absolute: 0 10*3/uL (ref 0.0–0.1)
Basophils Relative: 0 % (ref 0–1)
Monocytes Relative: 8 % (ref 3–12)
Neutro Abs: 7.6 10*3/uL (ref 1.7–7.7)
Neutrophils Relative %: 81 % — ABNORMAL HIGH (ref 43–77)

## 2011-01-12 LAB — OVA AND PARASITE EXAMINATION

## 2011-01-12 LAB — STOOL CULTURE

## 2011-01-22 ENCOUNTER — Encounter: Payer: Medicare Other | Attending: Physical Medicine & Rehabilitation | Admitting: Physical Medicine & Rehabilitation

## 2011-01-22 DIAGNOSIS — Z96649 Presence of unspecified artificial hip joint: Secondary | ICD-10-CM | POA: Insufficient documentation

## 2011-01-22 DIAGNOSIS — Y831 Surgical operation with implant of artificial internal device as the cause of abnormal reaction of the patient, or of later complication, without mention of misadventure at the time of the procedure: Secondary | ICD-10-CM | POA: Insufficient documentation

## 2011-01-22 DIAGNOSIS — M25559 Pain in unspecified hip: Secondary | ICD-10-CM | POA: Insufficient documentation

## 2011-01-22 DIAGNOSIS — G5710 Meralgia paresthetica, unspecified lower limb: Secondary | ICD-10-CM | POA: Insufficient documentation

## 2011-01-22 DIAGNOSIS — Z79899 Other long term (current) drug therapy: Secondary | ICD-10-CM | POA: Insufficient documentation

## 2011-01-22 DIAGNOSIS — M79609 Pain in unspecified limb: Secondary | ICD-10-CM | POA: Insufficient documentation

## 2011-01-22 DIAGNOSIS — E119 Type 2 diabetes mellitus without complications: Secondary | ICD-10-CM | POA: Insufficient documentation

## 2011-01-22 DIAGNOSIS — M2170 Unequal limb length (acquired), unspecified site: Secondary | ICD-10-CM | POA: Insufficient documentation

## 2011-01-22 DIAGNOSIS — Z794 Long term (current) use of insulin: Secondary | ICD-10-CM | POA: Insufficient documentation

## 2011-01-22 DIAGNOSIS — T8489XA Other specified complication of internal orthopedic prosthetic devices, implants and grafts, initial encounter: Secondary | ICD-10-CM | POA: Insufficient documentation

## 2011-01-22 DIAGNOSIS — T84039A Mechanical loosening of unspecified internal prosthetic joint, initial encounter: Secondary | ICD-10-CM

## 2011-01-22 DIAGNOSIS — I10 Essential (primary) hypertension: Secondary | ICD-10-CM | POA: Insufficient documentation

## 2011-01-25 LAB — URINALYSIS, ROUTINE W REFLEX MICROSCOPIC
Bilirubin Urine: NEGATIVE
Hgb urine dipstick: NEGATIVE
Ketones, ur: NEGATIVE mg/dL
Nitrite: NEGATIVE
Urobilinogen, UA: 0.2 mg/dL (ref 0.0–1.0)
pH: 5 (ref 5.0–8.0)

## 2011-01-25 LAB — CBC
HCT: 41.7 % (ref 39.0–52.0)
Hemoglobin: 14 g/dL (ref 13.0–17.0)
MCV: 96 fL (ref 78.0–100.0)
RBC: 4.34 MIL/uL (ref 4.22–5.81)
WBC: 5.6 10*3/uL (ref 4.0–10.5)

## 2011-01-25 LAB — COMPREHENSIVE METABOLIC PANEL
Alkaline Phosphatase: 44 U/L (ref 39–117)
BUN: 20 mg/dL (ref 6–23)
CO2: 28 mEq/L (ref 19–32)
Chloride: 105 mEq/L (ref 96–112)
Creatinine, Ser: 0.88 mg/dL (ref 0.4–1.5)
GFR calc non Af Amer: >60 mL/min (ref >60)
Total Bilirubin: 0.7 mg/dL (ref 0.3–1.2)

## 2011-01-25 LAB — TYPE AND SCREEN

## 2011-01-25 LAB — APTT: aPTT: 30 seconds (ref 24–37)

## 2011-01-25 LAB — PROTIME-INR: INR: 1 (ref 0.00–1.49)

## 2011-01-26 LAB — PROTIME-INR
INR: 1.1 (ref 0.00–1.49)
INR: 1.5 (ref 0.00–1.49)
Prothrombin Time: 14.9 seconds (ref 11.6–15.2)
Prothrombin Time: 18.8 seconds — ABNORMAL HIGH (ref 11.6–15.2)

## 2011-01-26 LAB — GLUCOSE, CAPILLARY
Glucose-Capillary: 102 mg/dL — ABNORMAL HIGH (ref 70–99)
Glucose-Capillary: 139 mg/dL — ABNORMAL HIGH (ref 70–99)
Glucose-Capillary: 157 mg/dL — ABNORMAL HIGH (ref 70–99)

## 2011-01-26 LAB — BASIC METABOLIC PANEL
BUN: 22 mg/dL (ref 6–23)
BUN: 24 mg/dL — ABNORMAL HIGH (ref 6–23)
CO2: 24 mEq/L (ref 19–32)
CO2: 25 mEq/L (ref 19–32)
CO2: 27 mEq/L (ref 19–32)
Calcium: 8 mg/dL — ABNORMAL LOW (ref 8.4–10.5)
Chloride: 103 mEq/L (ref 96–112)
Chloride: 99 mEq/L (ref 96–112)
Creatinine, Ser: 0.91 mg/dL (ref 0.4–1.5)
Creatinine, Ser: 0.92 mg/dL (ref 0.4–1.5)
GFR calc Af Amer: >60 mL/min (ref >60)
GFR calc Af Amer: >60 mL/min (ref >60)
Glucose, Bld: 127 mg/dL — ABNORMAL HIGH (ref 70–99)
Glucose, Bld: 134 mg/dL — ABNORMAL HIGH (ref 70–99)
Potassium: 3.9 mEq/L (ref 3.5–5.1)
Sodium: 132 mEq/L — ABNORMAL LOW (ref 135–145)
Sodium: 135 mEq/L (ref 135–145)

## 2011-01-26 LAB — COMPREHENSIVE METABOLIC PANEL
ALT: 35 U/L (ref 0–53)
Calcium: 9.5 mg/dL (ref 8.4–10.5)
Creatinine, Ser: 0.99 mg/dL (ref 0.4–1.5)
GFR calc Af Amer: >60 mL/min (ref >60)
Glucose, Bld: 107 mg/dL — ABNORMAL HIGH (ref 70–99)
Sodium: 141 mEq/L (ref 135–145)
Total Protein: 7.7 g/dL (ref 6.0–8.3)

## 2011-01-26 LAB — CBC
Hemoglobin: 14.2 g/dL (ref 13.0–17.0)
Hemoglobin: 9.2 g/dL — ABNORMAL LOW (ref 13.0–17.0)
MCHC: 33.9 g/dL (ref 30.0–36.0)
MCHC: 35.9 g/dL (ref 30.0–36.0)
MCHC: 36 g/dL (ref 30.0–36.0)
MCV: 93.6 fL (ref 78.0–100.0)
MCV: 93.8 fL (ref 78.0–100.0)
Platelets: 112 10*3/uL — ABNORMAL LOW (ref 150–400)
RBC: 2.74 MIL/uL — ABNORMAL LOW (ref 4.22–5.81)
RBC: 2.98 MIL/uL — ABNORMAL LOW (ref 4.22–5.81)
RDW: 12.8 % (ref 11.5–15.5)
RDW: 13.2 % (ref 11.5–15.5)
WBC: 6.1 10*3/uL (ref 4.0–10.5)

## 2011-02-19 ENCOUNTER — Encounter: Payer: Medicare Other | Attending: Physical Medicine & Rehabilitation | Admitting: Physical Medicine & Rehabilitation

## 2011-02-19 DIAGNOSIS — G5710 Meralgia paresthetica, unspecified lower limb: Secondary | ICD-10-CM

## 2011-02-19 DIAGNOSIS — M1710 Unilateral primary osteoarthritis, unspecified knee: Secondary | ICD-10-CM

## 2011-02-19 DIAGNOSIS — M2170 Unequal limb length (acquired), unspecified site: Secondary | ICD-10-CM

## 2011-02-19 DIAGNOSIS — M25559 Pain in unspecified hip: Secondary | ICD-10-CM | POA: Insufficient documentation

## 2011-02-19 DIAGNOSIS — M79609 Pain in unspecified limb: Secondary | ICD-10-CM | POA: Insufficient documentation

## 2011-02-19 DIAGNOSIS — G8929 Other chronic pain: Secondary | ICD-10-CM | POA: Insufficient documentation

## 2011-02-20 NOTE — Assessment & Plan Note (Signed)
Thomas [name] is back regarding his chronic right leg pain and hip pain.  He started the fentanyl patch at last visit and he did notice some major benefit.  He had no problems other than some irritation initially.  With this, he moved the patch to his shoulders, he had no problems.  He is interested in pursuing orthotic for his right shoe.  He started his bone stimulator last month as well.  His pain is about 6/10, which is in line with last month.  Sleep is fair.  He does report use of a cane still for alleviating weight on the right leg.  REVIEW OF SYSTEMS:  Notable for numbness, trouble walking.  Full 12- point review is in the written health and history section of the chart.  SOCIAL HISTORY:  Unchanged.  He is married, seems to be doing well with his wife.  PHYSICAL EXAMINATION:  VITAL SIGNS:  Blood pressure is 148/58, pulse 72, respiratory rate 18.  He is satting 99% on room air. GENERAL:  The patient is pleasant, alert and oriented x3. MUSCULOSKELETAL:  He walks with antalgia on the right side.  He has noticeable leg difference once again approximately of 1/2 inch.  He is generally stable with his cane.  His strength is generally 5/5 except for some pain inhibition at the right hip. HEART:  Regular. CHEST:  Clear. ABDOMEN:  Soft, nontender.  ASSESSMENT: 1. 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 2. Meralgia paresthetica, left lower extremity. 3. 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  PLAN: 1. We will send the patient to orthotist for left shoe buildup. 2. Continue Neurontin 300 mg t.i.d. 3. We will increase fentanyl patch 25 mcg q.72 hours and change the     breakthrough medication to oxycodone 5 mg 1 q.8 hours p.r.n. #45. 4. I will see the patient back in about a month.     Ranelle Oyster, M.D. Electronically Signed    ZTS/MedQ D:  02/19/2011 10:16:48  T:  02/19/2011 22:37:09  Job #:  [id number]  cc:   Madlyn Frankel Charlann Boxer, M.D. Fax:  [phone]  L. Lupe Carney, M.D. Fax: [phone]

## 2011-02-23 NOTE — Cardiovascular Report (Signed)
Thomas [name] NO.:  [id number]   MEDICAL RECORD NO.:  [id number]          PATIENT TYPE:  OIB   LOCATION:  2807                         FACILITY:  [facility]   PHYSICIAN:  Corky Crafts, MDDATE OF BIRTH:  [birth year]   DATE OF PROCEDURE:  05/30/2007  DATE OF DISCHARGE:                            CARDIAC CATHETERIZATION   REFERRING PHYSICIAN:  L. Lupe Carney, M.D.   PROCEDURES PERFORMED:  Ascending aortogram and coronary angiogram.   OPERATOR:  Corky Crafts, M.D.   INDICATIONS:  Unstable angina.   PROCEDURE:  The risks, benefits of cardiac catheterization were  explained to the patient and informed consent was obtained. The patient  was brought to the cath lab. He was prepped and draped in usual sterile  fashion.  His right groin was infiltrated with 1% lidocaine.  A 6-French  arterial sheath was placed into his right femoral artery using modified  Seldinger technique.  An AR-2 guiding catheter with side holes was  placed and a diagnostic catheterization had been done previously in  [place] showing a 90% ostial PDA lesion, 80% distal right coronary  artery lesion, and 80% mid right coronary artery lesion. These findings  were confirmed with a guide shot. Angiomax was used for anticoagulation.  Prowater wire was placed across the lesion in the mid-right coronary  artery and distal right coronary artery and into the posterolateral  branch.  The BMW wire was then placed across the mid-right, distal right  lesions and then across the ostial PDA lesion. The ostial PDA lesion was  angioplastied with a 2.0 x 9 mm balloon inflated to 8 atmospheres for 14  seconds.  This balloon was then used to angioplasty the distal right  lesion. A 2.0 x 12 mini Vision stent was then placed in the ostium of  the PDA and inflated to 8 atmospheres for 25 seconds. The stent was then  post dilated with a 2.25 x 10 mm DuraStar balloon inflated to 18  atmospheres for 32  seconds. The distal right lesion which had been  angioplastied with the 2.0 x 9 balloon subsequently had 2.5 x 12 mm mini  Vision stent placed across and that was deployed at 14 atmospheres for  34 seconds.  The Prowater wire was then removed from the PL branch and  the posterolateral branch was rewired through the new distal right  coronary artery stent. The distal right coronary artery stent was then  post dilated with 2.75 x 8 mm PowerSail balloon inflated to 16  atmospheres for 32 seconds. There is an excellent angiographic result of  the bifurcation area.  The mid-right coronary artery was then direct  stented with a 3.0 x 15-mm Vision stent deployed at 16 atmospheres for  46 seconds.  The stent was then post dilated with 3.5 x 8-mm Quantum  balloon inflated at 16 atmospheres for 27 seconds and 16 atmospheres for  21 seconds. There is an excellent angiographic result with TIMI III flow  and no residual stenosis in any of three stents. At this point the  patient developed severe chest pain.  The blood  pressure also increased.  He described the pain as left shoulder radiating to his left jaw. At  this point in the ascending aortogram was performed.  A pigtail catheter  was advanced to the ascending aorta under fluoroscopic guidance.  Power  injection of contrast was performed in the LAO projection to visualize  the aortic arch.  There was no evidence of any aortic dissection.  The  patient's pain continued. The diagnostic JR-4 catheter was used to  engage the ostium of the right coronary artery and a digital angiography  was performed in multiple projections.  This revealed widely patent RCA  stent.   IMPRESSION:  1. Successful stent placement to the PDA with 2.0 x 12 mini Vision,      the distal right coronary artery with 2.5 x 12 mini Vision, and the      mid-right coronary artery with a 3.0 x 15 Vision stent. Bare metal      stents were used because the patient has had problems  with      significant hematuria in the recent past. He has required frequent      procedures to treat kidney stones.  2. No aortic dissection.  3. Starclose was deployed in his right groin for hemostasis because of      patient discomfort with manual compression that he has had in the      past.   RECOMMENDATIONS:  The patient will continue with aspirin and Plavix  along with other secondary preventions including beta blockade and lipid  lowering therapy.  We will continue IV nitroglycerin for now and we will  observe him in the PACU because of his chest pain. If there are no  complications he will be discharged tomorrow morning.      Corky Crafts, MD  Electronically Signed     JSV/MEDQ  D:  05/30/2007  T:  05/31/2007  Job:  [id number]

## 2011-02-23 NOTE — Op Note (Signed)
NAME:  LORAIN, KEAST NO.:  [id number]   MEDICAL RECORD NO.:  [id number]          PATIENT TYPE:  INP   LOCATION:  5022                         FACILITY:  [facility]   PHYSICIAN:  Loreta Ave, M.D. DATE OF BIRTH:  Jun 27, 1941   DATE OF PROCEDURE:  11/13/2008  DATE OF DISCHARGE:                               OPERATIVE REPORT   PREOPERATIVE DIAGNOSIS:  Femoral neck fracture, right hip screw fixation  x3, and nonunion.   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   PROCEDURE:  Conversion of right hip to a total hip replacement utilizing  a Stryker rejuvenate prosthesis.  Removal of the previously placed  screws x3.  A Press-Fit #8 femoral component.  A 30-mm neck with 8  degrees of anteversion.  A 40-mm Biolox head.  Acetabular fixation with  a 54-mm cup screw fixation x2 and a 40-mm internal diameter X3  polyethylene liner.   SURGEON:  Loreta Ave, MD   ASSISTANT:  Genene Churn. Barry Dienes, [place], present throughout the entire case  necessary for timely completion of procedure.   ANESTHESIA:  General.   BLOOD LOSS:  150 mL.   BLOOD GIVEN:  None.   SPECIMENS:  None.   CULTURES:  None.   COMPLICATIONS:  None.   DRESSING:  Soft compression with abduction pillow.   PROCEDURE:  The patient was brought to the operating room, placed on  operating table in supine position.  After adequate anesthesia had been  obtained, turned to a lateral position right side up.  Appropriate  padding and support.  Prepped and draped in usual sterile fashion.  Incision along the shaft of the femur to the trochanter extending  posterosuperior.  Skin and subcutaneous tissue divided.  Iliotibial band  exposed, incised, and a Charnley retractor put in place.  Fair amount of  scarring under the iliotibial band cleared up.  The lateral border of  the femur was exposed.   I split the vastus lateralis fascia site to  expose the lateral shaft.  Two of the screws were relatively easy to  find and the screwdriver inserted and these were both backed out without  too much difficulty.  The third screw was not evident at all and bone  completely overgrown this.  Utilizing the placement of the other screws,  I could identify the site.  Osteotome was used to clear up bone.  The  head of the screw was then found.  I backed this up part way, but not  completely.  I then took down the external rotator capsule off the back  of the intertrochanteric groove, tagged with FiberWire.  Hip dislocated  posteriorly.  Once that was achieved, the remaining screw was removed.  Micromotion at the fracture site.  The femoral neck was cut just below  the fracture site in line with definitive component, one fingerbreadth  above the lesser trochanter.  Acetabulum exposed.  More than moderate  degenerative changes were already evident.  There were changes on the  head as well as in the acetabulum.  Felt total hip replacement was the  best approach to take.  Appropriate retractors put in place.  The  acetabulum reamed up to bleeding bone, sufficient medial and inferior  placement.  Sized for a 54-mm cup.  The cup was hammered in place with  45 degrees abduction, 20 degrees of anteversion.  Good capturing and  fixation.  This was augmented with two screws through the cup pre-  drilled and countersunk into the cup.  The 40-mm internal diameter X3  polyethylene liner inserted in the cup.  Attention turned to the femur.  Sequential reaming with the distal and proximal reamers and broaches  filling the canal nicely with #8 component.  After appropriate trials,  the femur was fitted with a #8 component and hammered down in place.  I  utilized a 30-mm neck with 8 degrees of anteversion and a 40-mm Biolox  head.  Once this was constructed and assembled, the hip was reduced.  Excellent stability  in flexion/extension.  Equal leg lengths.  Very good  motion.  I had to release to allow the capsule because of the scarring,  but at completion, I was pleased with alignment, stability, and motion  as well as leg lengths.  Wound irrigated.  External rotator and capsular  repair of the back of the intertrochanteric groove through drill holes  and a FiberWire tied over bony bridge.  Charnley retractor removed.  Neurovascular structures had been identified, protected throughout.  Iliotibial band closed with #1 Vicryl, skin and subcutaneous tissue with  Vicryl and staples.  Margins were injected with Marcaine.  Sterile  compressive dressing applied.  Turned to supine position.  Abduction  pillow placed.  Anesthesia reversed.  Brought to the recovery room.  Tolerated surgery well.  No complications.      Loreta Ave, M.D.  Electronically Signed     DFM/MEDQ  D:  11/13/2008  T:  11/14/2008  Job:  [id number]

## 2011-02-23 NOTE — Cardiovascular Report (Signed)
NAME:  Thomas [name], Thomas [name] NO.:  [id number]   MEDICAL RECORD NO.:  [id number]          PATIENT TYPE:  OIB   LOCATION:  3715                         FACILITY:  [facility]   PHYSICIAN:  Corky Crafts, MDDATE OF BIRTH:  [birth year]   DATE OF PROCEDURE:  10/06/2007  DATE OF DISCHARGE:                            CARDIAC CATHETERIZATION   REFERRING:  Dr. Lupe Carney   PROCEDURE PERFORMED:  Left heart catheterization, left ventriculogram,  coronary angiogram, PCI of the distal right coronary artery and  posterolateral branch.   INDICATIONS:  1. Stable angina.  2. Diabetes.  3. Known coronary artery disease.   FINDINGS:  The left main had a 25% distal stenosis.  The left circumflex  had mild atherosclerosis and was a medium-sized vessel.  There is a  small OM-1 which was patent but diffusely diseased.  The OM-2 appeared  occluded.  The distal vessel was filled by left-to-left collaterals.  There were left-to-right collaterals coming from the distal circumflex  to the distal RCA system.  The left anterior descending showed mild to  moderate atherosclerosis throughout.  There is a 25% lesion in the mid  vessel after the first and second diagonal.  The first diagonal was a  medium-sized vessel with a 50% proximal lesion.  The second diagonal was  a small vessel with a 50% ostial lesion.  The right coronary artery was  a large, dominant vessel.  The RCA stent in the mid right was patent and  there was mild in-stent stenosis noted.  There was significant in-stent  restenosis noted in the distal RCA stent and a subtotal occlusion of the  posterolateral branch.  The PDA stent was patent with approximately a  25% in-stent restenosis of the proximal PDA stent.  The left  ventriculogram showed normal LV function with an ejection fraction of  55%.   HEMODYNAMICS:  Left ventricular pressure 97/6 with an LVEDP of 11 mmHg.  Aortic pressure of 97/55 with a mean aortic pressure of  72 mmHg.   PCI NARRATIVE:  An AR-2 guide with side holes was used.  A Prowater wire  was placed into the posterolateral branch.  A 2.5 x 15-mm Maverick  balloon was then placed across the lesion both in the distal right and  the ostium of the posterolateral branch.  It was deployed at 8  atmospheres for 22 seconds.  A BMW wire was then placed in the PDA.  A  2.5 x 15-mm Promus stent was placed across the lesion in the  posterolateral branch and back into the restenosis of the distal RCA.  It was deployed at 10 atmospheres for 39 seconds.  The mid portion of  the stent was then post dilated with a 2.5 x 12-mm Quantum balloon  inflated to 16 atmospheres for 24 seconds.  There is an excellent  angiographic result.  TIMI 3 flow was maintained throughout.  There was  no residual stenosis in the distal RCA and PL branch.  There is a  residual 50% stenosis in the proximal PDA.   IMPRESSION:  1. Distal right coronary artery/ostial posterolateral restenosis  of 70-      99% across the lesion.  2. Moderate atherosclerosis in the left system.  3. Normal left ventricular function.  4. Normal hemodynamics.  5. Patent mid right coronary artery stent with mild in-stent      restenosis.  6. Successful stent placement into the distal right and extending into      the proximal posterolateral branch with a 2.5 x 15-mm drug-eluting      Promus stent.   RECOMMENDATIONS:  Continue aspirin and Plavix for at least a year.  Continue aggressive secondary prevention.  Watch the patient overnight.  Assuming there are no complications, he can go home tomorrow.      Corky Crafts, MD  Electronically Signed     JSV/MEDQ  D:  10/06/2007  T:  10/07/2007  Job:  [id number]

## 2011-02-26 NOTE — H&P (Signed)
[name], [name] NO.:  [id number]   MEDICAL RECORD NO.:  [id number]          PATIENT TYPE:  INP   LOCATION:  1829                         FACILITY:  [facility]   PHYSICIAN:  Corky Crafts, MDDATE OF BIRTH:  Jun 24, 1941   DATE OF ADMISSION:  10/31/2006  DATE OF DISCHARGE:                              HISTORY & PHYSICAL   PRIMARY CARE PHYSICIAN:  L. Lupe Carney, M.D.   CARDIOLOGIST:  Corky Crafts, MD.   CHIEF COMPLAINT:  Palpitations and dyspnea.   HISTORY OF PRESENT ILLNESS:  Mr.  Thomas [name] is a 70 year old Caucasian  male with without a known history of coronary artery disease.  He has a  history of hypertension and borderline dyslipidemia.  The patient  complained of a sudden onset of palpitations on Friday of last week  accompanied by shortness of breath, fatigue, diaphoresis, sleep  disturbances, and nausea.  He denies dizziness (lightheadedness),  orthopnea, lower extremity edema, cough, short-term weight gain, or  chest pain, however, admits to PND.  He presented to his primary care  physician's office today for further evaluation of his symptoms, and a  12-lead EKG revealed tachycardia with a heart rate   DICTATION [address] [place], [place]      Corky Crafts, MD     RDM/MEDQ  D:  10/31/2006  T:  10/31/2006  Job:  [id number]   cc:   L. Lupe Carney, M.D.

## 2011-02-26 NOTE — Op Note (Signed)
NAMELEUL, NARRAMORE NO.:  [id number]   MEDICAL RECORD NO.:  [id number]          PATIENT TYPE:  INP   LOCATION:  2927                         FACILITY:  [facility]   PHYSICIAN:  Doylene Canning. Ladona Ridgel, MD    DATE OF BIRTH:  07/15/1941   DATE OF PROCEDURE:  11/02/2006  DATE OF DISCHARGE:                               OPERATIVE REPORT   INDICATION FOR CONSULTATION:  Evaluation of atrial flutter.   HISTORY OF PRESENT ILLNESS:  The patient is a 70 year old man with a  history of no significant prior cardiac history.  He has mild  hypertension which is well controlled.  The patient was in his usual  state of health until Friday when he noted tachy palpitations which  developed suddenly and lasted for several days before he sought medical  attention.  He states that he felt like his heart was going at about 100  miles per hour.  He noted some mild dyspnea and fatigue. No clear cut  chest pain.  He saw Dr. Lupe Carney in the office on Monday morning  and an EKG demonstrated atrial flutter with rapid ventricular response  and he was transferred to the hospital.  The patient was treated with  intravenous Cardizem and spontaneously converted to sinus rhythm.  He  has been on heparin.  The patient's dyspnea has improved.  His chest x-  ray initially demonstrated bilateral effusions and evidence of  congestive heart failure.  However, there was evidence of some  adenopathy and a CT scan demonstrated what was described as fairly  extensive mediastinal adenopathy.  The patient has done well and his  dyspnea has resolved once he was back in sinus rhythm.   His additional past medical history is really unremarkable.   FAMILY HISTORY:  Notable for mother dying of emphysema and his father of  aneurysm.   SOCIAL HISTORY:  The patient lives in [place] with his girlfriend.  He has three adult children.  He has a 40-pack-year smoking history.  He  works as an Architectural technologist.   REVIEW OF SYSTEMS:  Notable for night sweats which he has had for  several months and notes that he soaks his pillow.  He has no hearing or  vision problems.  He denies skin rashes or lesions.  He has mild chest  pressure with his heart racing but not otherwise.  He has palpitations  as noted.  He denies cough or claudication.  He has rare wheezes. He  does note nocturia x2.  He denies weakness, numbness, depression,  nausea, vomiting, diarrhea, constipation, polyuria, polydipsia or cold  intolerance.  He denies any arthritic complaints.  He denies any  neurologic problems.   PHYSICAL EXAMINATION:  GENERAL:  He is a pleasant well appearing middle-aged man no acute  distress.  VITAL SIGNS:  Blood pressure 142/81, pulse is 93 and regular,  respirations were 20, temperature is 98.7.  HEENT:  Normocephalic and atraumatic.  Pupils are round.  His oropharynx  is moist. Sclerae anicteric.  NECK:  No demonstrated 6 cm of jugular venous distension.  There is no  thyromegaly.  The trachea is midline.  Carotids are 2+ and symmetric.  CARDIOVASCULAR:  Exam reveals a regular rate and rhythm with normal S1  and S2. There are no obvious murmurs, rubs or gallops. The PMI was not  enlarged.  LUNGS:  Notable for some scattered rales and wheezes in the bases but no  increased work of breathing.  ABDOMEN:  Soft, nontender, nondistended.  No organomegaly.  There is no  rebound or guarding.  There are no pulsatile masses.  EXTREMITIES:  Demonstrate no cyanosis, clubbing or edema.  Pulses were  2+ and symmetric.  NEUROLOGIC:  Alert and oriented x3.  Cranial nerves intact.  Strength  5/5 and symmetric.   The EKG demonstrates reversed typical atrial flutter with negative  flutter waves in lead V1 and positive flutter waves in the inferior  leads.  Labs demonstrated normal hemoglobin and no significant elevation  of cardiac enzymes, D-dimer, and his BNP was only minimally elevated.    IMPRESSION:  1. Symptomatic atrial flutter, now resolved.  2. Lymphadenopathy on chest x-ray and CT scan.  3. Hypertension.  4. Borderline dyslipidemia.   DISCUSSION:  The patient has returned to sinus rhythm.  He is out of  congestive heart failure.  I have discussed the treatment options to him  with regards to his atrial flutter.  One would be medical therapy alone  in conjunction with Coumadin which would be indefinite.  The second  would be catheter ablation.  In addition to this, he will require workup  of his adenopathy which was described as either reactive or consistent  with a neoplastic process.  This will be deferred to the pulmonary  critical care specialist.      Doylene Canning. Ladona Ridgel, MD  Electronically Signed     GWT/MEDQ  D:  11/02/2006  T:  11/02/2006  Job:  [id number]   cc:   L. Lupe Carney, M.D.

## 2011-02-26 NOTE — H&P (Signed)
NAME:  Thomas [name], Thomas [name] NO.:  [id number]   MEDICAL RECORD NO.:  [id number]          PATIENT TYPE:  INP   LOCATION:  1829                         FACILITY:  [facility]   PHYSICIAN:  Corky Crafts, MDDATE OF BIRTH:  [birth year]   DATE OF ADMISSION:  10/31/2006  DATE OF DISCHARGE:                              HISTORY & PHYSICAL   PRIMARY CARE PHYSICIAN:  Lupe Carney, M.D.   CARDIOLOGIST:  Corky Crafts, MD   CHIEF COMPLAINT:  Palpitations and dyspnea.   HPI:  [name] is a 70 year old Caucasian male without a known  history of coronary artery disease.  He has a history of hypertension  and borderline dyslipidemia, the latter of which is not being treated  with medications.  The patient complains of a sudden onset of  palpitations and shortness of breath from Friday of last week that was  accompanied by fatigue, diaphoresis, sleep disturbances, and nausea.  He  denies dizziness (lightheadedness, orthopnea, lower extremity edema,  chest pain, cough, and short-term weight gain, however admits to PND).  He presented to his primary care physician's office on today for further  evaluation of his symptoms.  A 12-lead EKG revealed sinus tachycardia  with a ventricular rate of 145 beats per minute.  He was transferred to  the [facility] emergency department via [facility] vehicle.  Upon  arrival, a 12-lead EKG revealed atrial tachycardia at 144 beats per  minute without evidence of ischemia.  A repeat EKG revealed sinus  tachycardia at 143 beats per minute.  Adenosine 12 mg was given, and the  patient's underlying rhythm on telemetry was atrial flutter.  He was  started on a IV Cardizem 20-mg bolus plus 5 mg per hour drip and  remained in the 140s on telemetry.  His Cardizem drip was titrated up to  10 mg per hour.  He complained of chest tightness and a headache that  were relieved with IV morphine 2 mg x2.  He is now admits to being chest  pain free.  Point  of care enzymes were negative x1.  Serial cardiac  enzymes were negative x1 with a peak troponin of 0.03.  Chest x-ray  revealed no interstitial opacities that is suspicious for acute edema.  His heart size was at the upper limits of normal.  D-dimer was elevated  at 0.89, and the patient was sent for STAT chest CT angiography with  contrast to rule out PE.   PAST MEDICAL HISTORY:  1. Hypertension.  2. Borderline dyslipidemia.  3. Insomnia.  4. Tobacco abuse.  5. Status post incision and drainage of a peri-rectal abscess.  6. Bilateral ureteral calculi status post distal urethroscopy.   ALLERGIES:  NO KNOWN DRUG ALLERGIES.   HOME MEDICATIONS:  1. Cardia 240 mg daily.  2. Tylenol PM at bedtime as needed.  3. Multivitamin 1 tablet daily.   FAMILY HISTORY:  Insignificant for early coronary artery disease.  Father deceased, brain aneurysm.  Mother deceased, emphysema.   SOCIAL HISTORY:  Separated with three adult children.  His youngest son  has diabetes mellitus.  He  is self-employed as an Higher education careers adviser.  He admits to tobacco use with a 35-40 pack year history.  He denies alcohol, or illicit drug use.   REVIEW OF SYSTEMS:  All other systems reviewed are negative, other than  what is stated in the HPI.   PHYSICAL EXAM:  GENERAL:  A 70 year old male, appearing short of breath,  pleasant and cooperative, capsule NAD.  VITALS:  Temperature 98.1, blood pressure 156/97, pulse 143 beats,  respirations 22, O2 saturation 98% over 3 liters.  HEENT: Unremarkable.  NECK:  Supple without JVD or carotid bruits, bilaterally.  PULMONARY:  Breath sounds revealed bilateral crackles at the bases with  expiratory wheezing.  CV:  Tachycardic.  Normal S1 and S2 without murmurs, gallops, clicks or  rubs.  ABDOMEN:  Soft, nontender, nondistended with active bowel sounds.  EXTREMITIES:  No peripheral edema.  Capsule DP and capsule PT pulses  2+/2, bilaterally.  NEURO:  No focal  motor or sensory deficits.  PSYCH:  Normal mood and affect.  SKIN:  Warm and dry without rashes or lesions.  BACK:  No kyphosis or scoliosis.   LABORATORY DATA:  White blood count and 10.1, hemoglobin 15.3,  hematocrit 44.5, platelets 163,000, sodium 137, potassium 3.7, chloride  105, CO2 23, BUN 13, creatinine 0.87, glucose 157, BNP 388, D-dimer  0.89, magnesium 2.1.  TSH is pending.   Chest x-ray and 12-lead EKGs as stated in the HPI.   ASSESSMENT:  1. First number palpitations secondary to atrial flutter, new onset.  2. Dyspnea, questionable etiology, atrial flutter versus CHF, versus      rule out PE.  3. Hypertension.  4. Mild volume overload with an elevated BNP.  5. Headache.   PLAN:  1. Admit to ICU under the service of Dr. Everette Rank with a diagnosis      of atrial flutter.  2. Titrate IV Cardizem drip 5-10 mg per hour, keeping systolic blood      pressure greater than 100 and heart rate less than 100.  3. A 2-D echocardiogram.  Hold for heart rate greater than 100.  4. Daily BMET and CBC.  5. IV Lasix 40 mg x1 now.  6. Stat chest CT angiography with contrast to rule out PE.  7. Fasting lipid panel and BNP in a.m.  8. Start IV Lopressor 5 mg x1 now.  9. Subcu Lovenox q.12 h per pharmacy protocol.  First dose to be given      in the ED.  10.PT, INR, and PTT.  11.Serial cardiac enzymes including troponin-I q.8 h x2, to rule out      MI.  12.Smoking cessation consult secondary to tobacco abuse.  13.The patient was seen, interviewed, and examined by Dr. Everette Rank      who participated in the medical decision making and plan of care.  14.Strict Is and Os and daily weights using the same scale.   1. Bedrest with a bedside commode.      [address] [place], [place]      Corky Crafts, MD  Electronically Signed    RDM/MEDQ  D:  10/31/2006  T:  10/31/2006  Job:  [id number]   cc:   L. Lupe Carney, M.D. Corky Crafts, MD

## 2011-02-26 NOTE — Discharge Summary (Signed)
NAMEDIONE, MCCOMBIE NO.:  [id number]   MEDICAL RECORD NO.:  [id number]          PATIENT TYPE:  INP   LOCATION:  2019                         FACILITY:  [facility]   PHYSICIAN:  Corky Crafts, MDDATE OF BIRTH:  1941-05-04   DATE OF ADMISSION:  10/31/2006  DATE OF DISCHARGE:  11/04/2006                               DISCHARGE SUMMARY   ADMISSION DIAGNOSES:  1. Palpitations secondary to new onset atrial flutter.  2. Dyspnea.  3. Chest tightness.   DISCHARGE DIAGNOSES:  1. Palpitations secondary to atypical atrial flutter, status post      atrial ablation, now maintaining normal sinus rhythm.  2. Dyspnea of questionable etiology, chronic obstructive pulmonary      disease, versus mild volume overload, versus chest lymphadenopathy  3. Chest tightness, resolved.  4. Myocardial infarction ruled out with negative cardiac enzymes.  5. Hypertension, controlled.  6. Hypokalemia, replenished.  7. Headache, resolved.  8. Mild volume overload, status post diuresis with intravenous Lasix,      compensated.  9. Elevated D-dimer, status post chest CT angio with contrast medium,      negative for pulmonary embolus.  10.Bilateral pleural effusions with a left greater than the right,      with associated atelectasis via chest CT angiography.  11.Extensive mediastinal adenopathy via chest CT angiography with      contrast medium.  This could be reactive in nature, however,      neoplasm could not be excluded.  Recommended followup CT chest      angio after treatment of acute symptoms.  12.Chronic obstructive pulmonary disease, currently discharged on      prednisone taper.  13.Mild respiratory alkalosis via ABG, compensated.  14.Tobacco abuse with recommended cessation.  15.Mild leukocytosis, questionable etiology.  16.Dyslipidemia.   PROCEDURES:  1. EP study and radio frequency catheter ablation of atrial flutter,      November 03, 2006, with resulting termination of  atrial flutter and      restoration of sinus rhythm.  2. A 2-D echocardiogram, November 01, 2006.  3. CT chest angiography, October 31, 2006, negative for pulmonary      embolism.   HOSPITAL COURSE:  Mr. Lanigan is a 70 year old Caucasian male without a  known history of coronary artery disease.  He was admitted to the [facility] on October 31, 2006, with palpitations secondary to new  onset atrial flutter.  He also complained of severe dyspnea with a  questionable etiology.  The patient was started on capsule IV Cardizem  20 mg bolus and maintained on a 5 mg/hr drip with a heart rate remaining  in the 140s on telemetry.  He later converted to a normal sinus rhythm  with confirmation on EKG.  He was continued on IV Cardizem and was later  switched to oral Diltiazem.  An EP consult was called and revealed  atypical atrial flutter.  The patient was scheduled for an atrial  ablation with successful termination of atrial flutter and maintained a  normal sinus rhythm and was at 78 beats per minute upon discharge.  Also  upon admission, he complained of chest tightness that was relieved with  IV morphine 2 mg x2.  He was chest pain free following that for the  remainder of the admission.  Point-of-care enzymes were negative x1.  Serial cardiac enzymes revealed a peak troponin of 0.06, believed to be  secondary to atrial flutter.  Chest x-ray revealed new interstitial  opacities, suspicious for acute edema.  Heart size was at the upper  limits of normal.  D-dimer was elevated at 0.89.  A STAT chest CT  angiography with contrast medium was ordered and was negative for  pulmonary embolism.  However, it revealed bilateral pleural effusions  with the left greater than the right, with associated atelectasis.  There was some degree of intralobular septal thickening suggestive of  edema.  Also identified on chest CT was extensive mediastinal  adenopathy.  While this could be reactive in  nature, neoplasm could not  be excluded.  A followup CT chest angio is recommended that treatment of  acute symptoms.  During the admission, the patient complained of severe  shortness of breath with coarse breath sounds, revealing rhonchi as well  as wheezing.  A STAT ABG was obtained and revealed mild respiratory  alkalosis with a decreased pAO2.  This was compensated prior to  discharge.  A repeat PA and lateral chest x-ray was obtained and  revealed small bilateral pleural effusions with worsening pulmonary  edema.  The patient was on IV Lasix 40 mg twice daily.  This was  continued during the admission and he was later switched to oral Lasix.  The patient has a 35 to 40-year smoking history and his shortness of  breath is believed to be likely due to some degree of COPD.  He was  started on prednisone 50 mg daily and was discharged on a prednisone  taper.  He was also given breathing treatments with Xopenex inhaler  every 6 hours with improvement of his breathing.  The patient has a  history of hypertension and revealed an elevated blood pressure  intermittently during the admission.  He was started on IV nitroglycerin  which was titrated from 10 mcg/min to 20 mcg/min with better blood  pressure control and improvement of his shortness of breath.  The  following day, the patient was weaned from nitroglycerin with blood  pressure checks that were in acceptable ranges.  A 2-D echocardiogram  was obtained during this admission and revealed a LV systolic function  that was at the lower limits of normal with an EF of 50-55%.  There was  no diagnostic regional wall motion abnormalities.  However, it could not  be excluded on the basis of this study.  LV wall thickness was mildly  increased.  There was mild mitral valvular regurgitation.  The left  atrium was mildly dilated and an estimated peak pulmonary arterial systolic pressure was mildly increased.  On November 04, 2006, the  patient  was discharged to home in stable condition with a large  improvement in his breathing in comparison to the admission.  He  admitted to mild shortness of breath.  A breathing treatment was given  prior to admission with resolution of his shortness of breath.  The  patient was started on Coumadin during this admission.  The patient was  discharged to home on a prednisone taper as well as a prescription for  albuterol inhalers.  He was also discharged on Zithromax and scheduled  for a 2-week followup with Dr. Lupe Carney,  after which time a  followup CT will be scheduled.   LABORATORY DATA:  Sodium 138, potassium 3.8, chloride 103, CO2 26,  glucose 113, BUN 11, creatinine 0.66, calcium uncorrected 9.1, magnesium  2.1.  BNP 318, 277, and 379, respectively.  PT 14.6, INR 1.1.  White  blood count 11.4, hemoglobin of 14, hematocrit 40.7, platelets 172,000.  TSH 1.197.  Point-of-care cardiac markers myoglobin 60.6, CK-MB 2.2,  troponin I less than 0.05.  Serial cardiac enzymes:  CK total 82, 93,  and 98, respectively.  CK-MB 2.5, 2.5, and 2.8, respectively.  Troponin  I 0.03, 0.04, and 0.06, respectively.  Fasting lipid panel:  Total  cholesterol 193, triglycerides 169, HDL 28, LDL 131.   X-RAYS:  As stated in the hospital course .     EKGs:  A 12-lead EKG prior to discharge, November 02, 2006,  revealed a  normal sinus rhythm with a ventricular rate of 82 beats per minute, LVH  with QRS widening, repolarization abnormality.  This was consistent with  the EKG upon admission, after conversion to normal sinus rhythm.   CONDITION ON DISCHARGE:  Mr. Howatt was discharged to home in stable  condition without complaints of shortness of breath, angina,  palpitations, dizziness, or fatigue.  The patient was seen, interviewed,  and examined by Dr. Everette Rank prior to discharge and Dr. Eldridge Dace is  in agreement with discharging the patient.   DISCHARGE MEDICATIONS:  1. Prednisone 50 mg daily  for 2 days, then 40 mg daily for 2 days,      then 20 mg daily for 2 days, then discontinue prednisone.  This      represented a new prescription, a prescription was given with      refills.  2. Albuterol MDI q.6 h.  This is a new prescription, a prescription      was given with refills.  3. Zithromax as directed.  This represented a new prescription, a      prescription was given with refills.  4. Lasix 20 mg daily.  This represented a new prescription, a      prescription was given with refills  5. Chantix 1 mg orally twice daily for 11 weeks.  Prescription was      given with refills.  6. Cartia XT 240 mg daily.  7. Coumadin 5 mg daily.  This represented a new prescription, a      prescription was given with refills.  8. Darvocet-N 100, one to two tablets every 4-6 hours as needed for      headache.  This represented a new prescription, a prescription was      given with refills.  DISCHARGE INSTRUCTIONS:  1. Continue a low-sodium, low-fat diet.  2. Avoid straining.  3. Cease activity that causes chest pain, shortness of breath, or      dizziness.   FOLLOWUP ARRANGEMENTS:  1. An appointment has been scheduled at the [facility] on January28th at 4 p.m.  The patient is scheduled to      call [phone], if he is unable to make the scheduled appointment.  2. A followup appointment has been scheduled with Dr. Lupe Carney at      [place] at [facility] on February11,2008 at 10:30 a.m.      a.     Pending the patient health status following the antibiotic       course, a followup CT scan with angio will be scheduled by Dr.  Lupe Carney.  3. A followup appointment has been scheduled with Dr. Lewayne Bunting at      [facility] on February27,2008 at 9:15 a.m.  The      patient is scheduled to call [phone], if he is unable to make      scheduled appointment.   Addendum:  I spoke with Dr. Clovis Riley regarding the patient.  The CT scan will be  done  after his steroid taper and course of antibiotics.      [address] [place], [place]      Corky Crafts, MD  Electronically Signed    RDM/MEDQ  D:  11/04/2006  T:  11/04/2006  Job:  [id number]   cc:   L. Lupe Carney, M.D.  Doylene Canning. Ladona Ridgel, MD

## 2011-02-26 NOTE — Assessment & Plan Note (Signed)
[facility]                         ELECTROPHYSIOLOGY OFFICE NOTE   DAMARIS, GEERS                      MRN:          [id number]  DATE:12/07/2006                            DOB:          May 28, 1941    Mr. Cerezo returns today for followup.  He is a very pleasant 70-year-  old male with a history of atrial flutter and shortness of breath who  was admitted to the hospital several weeks ago with rapid atrial  flutter.  He underwent electrophysiologic study and catheter ablation of  his atrial flutter and did well.  He was discharged to home.  He returns  today for followup.  The patient has been stable.  He notes that a  repeat CT scan demonstrated resolution of his lymphadenopathy in his  chest (reactive).  His only complaint is that of occasional headache.   EXAMINATION:  GENERAL:  He is a pleasant, well-appearing man in no acute  distress.  VITAL SIGNS:  The blood pressure today was 170/90, the pulse 79 and  regular, respirations were 18, the weight was 201 pounds.  NECK:  Revealed no jugular venous distention.  LUNGS:  Were clear bilaterally to auscultation.  There were no wheezes,  rales, or rhonchi.  CARDIOVASCULAR:  Revealed a regular rate and rhythm with normal S1, S2.  EXTREMITIES:  Demonstrated no cyanosis, clubbing or edema.   The EKG demonstrates sinus rhythm with LVH.   IMPRESSION:  1. Atrial flutter status post catheter ablation.  2. Hypertension, well controlled.   DISCUSSION:  Overall, Mr. Strycharz is stable.  I have asked that he  discontinue his Coumadin therapy and take his Lasix only on a p.r.n.  basis.  We will see him back only on a p.r.n. basis as well.  He will  follow up with Dr. Lupe Carney and Dr. Everette Rank.     Doylene Canning. Ladona Ridgel, MD  Electronically Signed    GWT/MedQ  DD: 12/07/2006  DT: 12/07/2006  Job #: [id number]   cc:   L. Lupe Carney, M.D.  Corky Crafts, MD

## 2011-02-26 NOTE — Op Note (Signed)
TNAMELAURANCE, HEIDE                        ACCOUNT NO.:  [id number]   MEDICAL RECORD NO.:  [id number]                   PATIENT TYPE:  EMS   LOCATION:  ED                                   FACILITY:  [facility]   PHYSICIAN:  Sigmund I. Patsi Sears, M.D.         DATE OF BIRTH:  1940/11/01   DATE OF PROCEDURE:  DATE OF DISCHARGE:  06/20/2002                                 OPERATIVE REPORT   ATTENDING SURGEON:  Sigmund I. Patsi Sears, M.D.   PREOPERATIVE DIAGNOSES:  Right ureteral calculus.   POSTOPERATIVE DIAGNOSES:  Right ureteral calculi.   PROCEDURE:  1. Cystoscopy.  2. Right retrograde pyelography.  3. Right ureteroscopy with laser lithotripsy and stone extraction.  4. Right ureteral stent placement.   SURGEON:  Sigmund I. Patsi Sears, M.D.   ASSISTANT:  Crecencio Mc, MD   ANESTHESIA:  General.   COMPLICATIONS:  None.   INDICATIONS FOR PROCEDURE:  Mr. Soderlund is a 70 year old white male who  recently presented with right flank pain. He was subsequently found to have  a right ureteral calculus. After a trial of conservative management, the  patient elected to proceed with ureteroscopic stone removal. The potential  risks and benefits of this procedure were explained to the patient and he  consented.   DESCRIPTION OF PROCEDURE:  The patient was taken to the operating room and a  general anesthetic was administered. The patient was administered  preoperative antibiotics, placed in the dorsal lithotomy position, and  prepped and draped in the usual sterile fashion. Next cystoscopy was  performed with the 12 degree lens. This revealed a normal anterior and  posterior urethra. Examination of the bladder revealed the ureteral orifices  to be in the normal anatomic position bilaterally with efflux of clear  urine. There was no evidence of any tumors or other mucosal pathology, or  any stones. Attention was then turned to the right ureteral orifice and an  open ended catheter was  used to perform retrograde pyelography. This did  demonstrate a filling defect in the area at approximately the level of the  iliac vessels. There were no other obvious filling defects present. Due to  the small caliber of the ureters, a 0.038 guidewire was placed up in the  renal pelvis under direct vision. This passed without difficulty. The  ureteral orifice was then balloon dilated. The cystoscope was then removed  and the short 7.5 French semirigid ureteroscope was placed near the bladder.  The right ureteral orifice was cannulated and a stone was seen right at the  ureterovesical junction. A nitinol basket was used to grab the stone and it  was removed without difficulty. The ureter was then reexamined and there was  noted to be a large impacted stone in the area the correlated with the  previously described filling defect. An attempt to manipulate this stone  revealed it to be imbedded in the medial ureteral wall. Therefore the  holmium laser  was used with the 365 micron fiber to perform laser  lithotripsy of the stone. It was fragmented into small pieces except for one  piece which was able to be removed with the nitinol basket. The ureteroscope  was then removed and the wire was back loaded over the cystoscope. A 6 x 26  double J ureteral stent was then placed over the wire, the wire removed and  a good curl noted in the renal pelvis as well as in the bladder. The  patient's bladder was emptied and the procedure was ended. There were no  complications and the patient appeared to tolerate the procedure well. He  was able to be awakened and transferred to the recovery unit in satisfactory  condition. Please noted that Dr. Jethro Bolus was the operating  surgeon and was present and participated in this entire procedure.     Crecencio Mc, MD                            Sigmund I. Patsi Sears, M.D.    LB/MEDQ  D:  06/22/2002  T:  06/22/2002  Job:  [id number]   cc:   Lynelle Smoke I.  Patsi Sears, M.D.

## 2011-02-26 NOTE — Op Note (Signed)
[facility]  Patient:    Thomas [name], Thomas [name]                      MRN: [id number] Proc. Date: 10/17/00 Adm. Date:  [id number] Attending:  Laqueta Jean                           Operative Report  PREOPERATIVE DIAGNOSIS:  Left low ureteral calculus.  POSTOPERATIVE DIAGNOSIS:  Passed left low ureteral calculus.  OPERATION: 1. Cystourethroscopy. 2. Left retrograde pyelogram, 3. Left ureteroscopy. 4. Insertion of local anesthetic in the left ureter.  SURGEON:  Sigmund I. Patsi Sears, M.D.  ANESTHESIA:  General (LMA)  PREPARATION:  After appropriate pre-anesthesia, the patient was brought to the operating room and placed on the operating table in the dorsal supine position where general LMA anesthesia was introduced.  He as then replaced in the dorsal lithotomy position where the pubis was prepped with Betadine solution and draped in the usual fashion.  REVIEW OF HISTORY:  This 70 year old male was status post basket extraction of left lower ureteral calculus two weeks ago with recurring, severe left ureteral spasm and pain.  Repeat IVP on Friday, January 4,  shows 2 x 5 mm left lower ureteral calculus with mild hydronephrosis.  The patient is now here for basket extraction.  His pain was relieved over weekend, but he has not noted passage of stone.  DESCRIPTION OF PROCEDURE:  Cystourethroscopy was accomplished which revealed a normal-appearing bladder, and left retrograde pyelogram revealed a normal-appearing ureter with dilated left ureteral orifice.  The retrograde pyelogram was performed which showed normal-appearing ureter.  Ureteroscopy was performed which showed irregular area of the left ureterovesical junction, but no stone was identified.  The entire ureter was "scoped," and also basket was placed up into the kidney, but no stone could be retrieved or identified. The remaining ureter appeared completely normal.  It was felt that the  patient had passed the stone or that it had broken up and passed.  He as awakened after Xylocaine was placed in the ureter and B & O suppository was placed.  He was given IV Toradol. DD:  10/17/00 TD:  10/17/00 Job: [id number]

## 2011-02-26 NOTE — Op Note (Signed)
[place]. [facility]  Patient:    Thomas [name], Thomas [name]                      MRN: [id number] Proc. Date: 10/02/00 Adm. Date:  [id number] Attending:  Lindaann Slough                           Operative Report  PREOPERATIVE DIAGNOSIS:  Left ureteral stone.  POSTOPERATIVE DIAGNOSIS:  Left ureteral stone.  OPERATION PERFORMED:  Cystoscopy, ureteroscopy with stone extraction, left retrograde pyelogram and insertion of double-J catheter.  SURGEON:  Lindaann Slough, M.D.  ANESTHESIA:  General.  INDICATIONS FOR PROCEDURE:  The patient is a 70 year old male who was seen in the emergency department on October 01, 2000 with sudden onset of severe left flank pain.  A CT scan showed a 4 mm partially obstructing stone at the midureter.  The patient was sent home with oral analgesics. He returned to the emergency department the same day with the same symptoms.  He was then admitted for observation.  The patient continued to complain of pain and a KUB showed the stone at the level of the ____________ .  The patient is scheduled now for cystoscopy and stone manipulation.  DESCRIPTION OF PROCEDURE:  Under general anesthesia, the patient was prepped and draped and placed in the dorsal lithotomy position.  A #25 cystoscope could then be passed through the meatus.  A meatotomy was then done.  The cystoscope was then passed in the bladder.  The ureter is normal.  The bladder is normal.  There is no stone or tumor in the bladder.  The ureteral orifices are in normal position and shape.  A cone-tipped catheter was then passed through the cystoscope into the left ureteral orifice.  Contrast was then injected through the cone-tipped catheter.  The distal ureter was normal. There is a filling defect at level of the ____________.  The cone-tipped catheter was then removed.  A guide wire was then passed through the cystoscope into the ureter.  The intramural ureter was then dilated  with a 4 cm ureteral balloon catheter.  The ureteral balloon catheter was removed. The cystoscope was removed.  A #6 rigid ureteroscope was then passed in the bladder into the ureter and advanced.  A stone was visualized at the level of the midureter.  The stone was then extracted with a Segura basket.  The ureteroscope was then reinserted in the bladder and into the ureter and contrast was then injected towards the ureteroscope.  There was no evidence of extravasation of contrast from the ureter and the proximal ureter and collecting system were dilated.  The ureteroscope was then removed.  The guide wire was then backloaded into the cystoscope and a 6 French 26 double-J catheter was passed over the guide wire.  The proximal end of the catheter was in the collecting system.  The distal end was in the bladder.  The bladder was then emptied.  The cystoscope was guide wire were removed.  The patient tolerated the procedure well and left the operating room and left the operating room in satisfactory condition to the post anesthesia care unit. D:  10/02/00 TD:  10/03/00 Job: [id number]

## 2011-02-26 NOTE — Op Note (Signed)
[place]. [facility]  Patient:    Thomas [name], Thomas [name]                     MRN: [id number] Proc. Date: 04/14/00 Attending:  Angelia Mould. Derrell Lolling, M.D. CC:         Desma Maxim, M.D.                           Operative Report  PREOPERATIVE DIAGNOSIS: Perirectal abscess.  POSTOPERATIVE DIAGNOSIS: Perirectal abscess.  OPERATION/PROCEDURE:  Rigid proctoscopy, incision and drainage of perirectal abscess (right posterior).  ANESTHESIA:  SURGEON: Angelia Mould. Derrell Lolling, M.D.  INDICATIONS:  This is a 70 year old white male who presents with a three-day history of progressive pain and swelling in his right buttock. He has had two prior episodes in the past four months which spontaneously drained and then resolved. He has had mildly severe pain today.  On examination, he has a 6-7 mm abscess and cellulitis in the right gluteal area in the right posterior position. This abscess is approximately 3 cm away from the anal verge, but it is off the midline and it appears atypical for a pilonidal abscess. There was no midline dimpling or fistula to suggest pilonidal abscess. There was no obvious anal canal disease. The patient was brought to the operating room emergently for drainage of his abscess.  OPERATIVE TECHNIQUE:  Following induction of general endotracheal anesthesia, the patient was placed in the dorsal lithotomy position. The perianal area and gluteal area were prepped and draped in the usual sterile fashion. Digital rectal examination revealed a normal sphincter tone, no palpable mass, no blood, normal appearing stool. The abscess in the right posterior position did not appear to involve the levator muscles at all, nor did it appear to involve the sphincter mechanism. Anoscopy was performed and the patient was found to have some small internal hemorrhoids and small external hemorrhoids. There was no fissure. No fistula and no purulence or proctitis. Rigid  proctoscopy was carried out and I was able to get well into the sigmoid colon about 20 cm or so and there was no gross abnormality. The mucosa looked good. Preparation was poor. I could have missed a small polyp, but otherwise no gross abnormalities.  In the right posterior position, overlying the abscess, I performed a radially oriented elliptical excision of a button of skin and drained the abscess. I debrided some chronically fibrotic material and digitally explored this area and felt that I had drained everything. Hemostasis was good, achieved with electrocautery. The wound was irrigated with saline. I packed the abscess with iodoform gauze, observed the wound for several minutes to make sure there was no bleeding and then placed an external gauze bandage. The patient tolerated the procedure well and was taken to the recovery room in stable condition. Estimated blood loss was about 25 cc. No complications. Sponge, needle, and instrument counts were correct. DD:  04/14/00 TD:  04/14/00 Job: [id number]

## 2011-02-26 NOTE — Op Note (Signed)
NAMEWILBERT, HAYASHI NO.:  [id number]   MEDICAL RECORD NO.:  [id number]          PATIENT TYPE:  INP   LOCATION:  2019                         FACILITY:  [facility]   PHYSICIAN:  Doylene Canning. Ladona Ridgel, MD    DATE OF BIRTH:  [birth year]   DATE OF PROCEDURE:  11/03/2006  DATE OF DISCHARGE:                               OPERATIVE REPORT   PROCEDURE PERFORMED:  Electrophysiologic study and radio frequency  catheter ablation of atrial flutter.   INTRODUCTION:  The patient is a 70 year old man who presented to the  hospital with chest pain and shortness of breath and was found to be in  reversed typical atrial flutter.  His flutter subsequent spontaneously  terminated.  He is now referred for catheter ablation.   PROCEDURE:  After informed consent was obtained, the patient is taken to  the diagnostic EP lab in fasting state.  After usual preparation and  draping, intravenous fentanyl and Midazolam was given for sedation.  A 6-  [place] hexapolar catheter was inserted percutaneously in the right  jugular vein advanced to the coronary sinus.  A 7-French 20 pole halo  catheter was inserted percutaneously in the right femoral vein and  advanced to the right atrium.  A 5-French quadripolar catheter was  inserted percutaneously in the right femoral vein and advanced to the  His bundle region.  After measurement of basic intervals, programmed  atrial stimulation was carried out from the coronary sinus and high  right atrium, base drive cycle length of 191 milliseconds.  The S1-S2  interval was stepwise decreased from 440 milliseconds down to 260  milliseconds where the AV node ERP was observed.  During probing  stimulation there were no AH jumps and echo beats noted.  Next rapid  atrial pacing was carried out the coronary sinus at paced cycle length  of 500 milliseconds and stepwise decreased down to 350 milliseconds  where AV Wenckebach was observed.  During rapid atrial pacing, the  PR  interval was less than the RR interval and there was no inducible SVT.  Additional rapid atrial pacing was then carried out from the coronary  sinus down to 200 milliseconds.  This resulted in the initiation of  atrial flutter.  This was typical atrial flutter with a cycle length of  240 milliseconds.  There was counter clockwise activation around  tricuspid valve annulus and the activation in the left atrium was  earliest from proximal to distal in the coronary sinus.  With these  findings, a diagnosis of typical atrial flutter was made and the 7-  [place] quadripolar ablation catheter was inserted percutaneously into  the right femoral vein and advanced to the right atrium.  The mapping  was again carried out and this demonstrated a fairly large atrial  flutter isthmus.  A total of 12 RF energy applications were subsequently  delivered to the atrial flutter isthmus resulting in termination of  atrial flutter and creation of bidirectional block in the atrial flutter  isthmus.  The patient was observed for 30 minutes.  During this time,  rapid ventricular pacing was carried  out from the RV apex demonstrating  VA Wenckebach cycle length of 340 milliseconds.  During rapid  ventricular pacing, the atrial activation sequence was midline and  decremental.  Next, programmed ventricular stimulation was carried out  from the RV apex at base drive cycle length of 045 milliseconds.  The S1-  S2 interval stepwise decreased down to 240 milliseconds where the  retrograde AV node ERP was observed.  During programmed ventricular  stimulation the atrial activation was midline and decremental.  The  patient was subsequently observed for 30 minutes and had no recurrent  atrial flutter isthmus conduction.  The catheters were removed.  Hemostasis was assured and the patient was returned to his room in  satisfactory condition.   COMPLICATIONS:  There were no immediate procedure complications.   RESULTS:   A.  Baseline ECG.  Baseline ECG demonstrates sinus rhythm with  an interventricular conduction delay with a QRS duration of 130  milliseconds, however.  B.  Baseline intervals.  The sinus node cycle length was 724  milliseconds.  The AH interval was 93 milliseconds.  The HV interval was  34 milliseconds.  The QRS duration 145 milliseconds.  C.  Rapid ventricular pacing.  Rapid ventricular pacing was carried out  from the RV apex at a pacing cycle length of 600 milliseconds and  stepwise decreased down to 340 milliseconds where VA Wenckebach was  observed.  During rapid ventricular pacing the atrial activation was  midline and decremental.  D.  Programmed ventricular stimulation.  Programmed ventricular  stimulation was carried out from the RV apex at base drive cycle length  of 409 milliseconds.  The S1-S2 interval was stepwise decreased down to  240 milliseconds where ventricular refractoriness was observed.  During  programmed ventricular stimulation the atrial activation was midline and  decremental.  E.  Rapid atrial pacing.  Rapid atrial pacing was carried out from the  coronary sinus and high right atrium at pacing cycle length of 600  milliseconds and stepwise decreased down to 350 milliseconds where AV  Wenckebach was observed.  During rapid atrial pacing the PR interval was  less than the RR interval and there is no inducible SVT initially;  however, at additional decrements down to 210 milliseconds, there was  inducible atrial flutter.  F.  Programmed atrial stimulation.  Programmed atrial stimulation was  carried out in the coronary sinus and high right atrium at base drive  cycle length of 811 milliseconds.  The S1-S2 interval was stepwise  decreased down to 260 milliseconds where AV node ERP was observed.  During programmed atrial stimulation there no AH jumps, no echo beats  noted.  There is no inducible SVT.  G.  Arrhythmias observed. 1. Atrial flutter.  Initiation was  with rapid atrial pacing, duration      was sustained, cycle length was 246 milliseconds, method of      termination was catheter ablation.      a.     Mapping.  Mapping of atrial flutter isthmus demonstrated a       very unusually large atrial flutter isthmus.      b.     Radio frequency energy application.  There was total of 12       RF energy applications that were delivered to the atrial flutter       isthmus resulting in termination of atrial flutter, restoration of       sinus rhythm, creation of bidirectional block and atrial flutter  isthmus.   CONCLUSION:  This study demonstrates successful was electrophysiologic  study with RF catheter ablation of typical atrial flutter with a total  of 12 RF energy applications delivered the usual atrial flutter isthmus  resulting in termination of flutter, restoration of sinus rhythm,  creation of bidirectional block in atrial flutter isthmus.      Doylene Canning. Ladona Ridgel, MD  Electronically Signed     GWT/MEDQ  D:  11/03/2006  T:  11/03/2006  Job:  [id number]   cc:   Corky Crafts, MD  L. Lupe Carney, M.D.

## 2011-02-26 NOTE — Procedures (Signed)
[place]. [facility]  Patient:    EVERTON, BERTHA                      MRN: [id number] Proc. Date: 05/10/01 Adm. Date:  [id number] Attending:  Nelda Marseille CC:         Abran Cantor. Clovis Riley, MD   Procedure Report  PROCEDURE PERFORMED:  Colonoscopy with biopsy.  ENDOSCOPIST:  Petra Kuba, M.D.  INDICATIONS FOR PROCEDURE:  Patient with history of colon polyps, due for repeat screening.  Consent was signed after risks, benefits, methods, and options were thoroughly discussed on multiple occasions in the past.  MEDICATIONS USED:  Demerol 70 mg, Versed 7 mg.  DESCRIPTION OF PROCEDURE:  Rectal inspection was pertinent for external hemorrhoids small.  Digital exam was negative.  Video colonoscope was inserted and easily advanced around the colon to the cecum.  This did require some abdominal pressure but not position changes.  The cecum was identified by the appendiceal orifice and the ICV.  No obvious abnormality was seen on insertion.  The scope was then slowly withdrawn.  The prep was adequate. There was some liquid stool that required washing and suctioning.  The cecum, ascending and transverse were normal.  The scope was withdrawn around the left side of the colon.  An occasional hyperplastic appearing polyp was seen.  Two in the descending, two in the sigmoid were both hot biopsied.  Three in the rectum were cold biopsied.  There was a rare early left-sided diverticulum. Once back in the rectum the scope was then retroflexed pertinent for some internal hemorrhoids.  The scope was straightened and readvanced a short ways up the sigmoid.  Air was suctioned, scope removed.  The patient tolerated the procedure well.  There was no obvious immediate complication.  ENDOSCOPIC DIAGNOSIS: 1. Internal and external hemorrhoids. 2. Questionable hyperplastic appearing left-sided tiny to small polyps with    four hot biopsies and three cold biopsies. 3.  Left-sided early diverticula. 4. Otherwise within normal limits to the cecum.  PLAN:  Yearly rectals and guaiacs per Dr. Clovis Riley.  Happy to see back p.r.n. One week customary postpolypectomy instructions.  Await pathology but possibly recheck in five years. DD:  05/10/01 TD:  05/10/01 Job: [id number]

## 2011-03-19 ENCOUNTER — Encounter: Payer: Medicare Other | Attending: Physical Medicine & Rehabilitation | Admitting: Physical Medicine & Rehabilitation

## 2011-03-19 DIAGNOSIS — G5710 Meralgia paresthetica, unspecified lower limb: Secondary | ICD-10-CM | POA: Insufficient documentation

## 2011-03-19 DIAGNOSIS — M2170 Unequal limb length (acquired), unspecified site: Secondary | ICD-10-CM

## 2011-03-19 DIAGNOSIS — M1710 Unilateral primary osteoarthritis, unspecified knee: Secondary | ICD-10-CM

## 2011-03-19 DIAGNOSIS — M25559 Pain in unspecified hip: Secondary | ICD-10-CM | POA: Insufficient documentation

## 2011-03-19 DIAGNOSIS — G8929 Other chronic pain: Secondary | ICD-10-CM | POA: Insufficient documentation

## 2011-03-19 DIAGNOSIS — M79609 Pain in unspecified limb: Secondary | ICD-10-CM | POA: Insufficient documentation

## 2011-03-20 NOTE — Assessment & Plan Note (Signed)
Thomas [name] is back regarding his chronic right leg pain and hip pain.  He put his fentanyl patch on the side of his thigh and notes that it is sticking better, but he really does note that the medication is making a difference.  He did receive his orthotic insert which amounts to about 1/2 inch addition to the left side.  He is only gotten these about 4 days ago.  Pain remains is 6/10.  He feels the Neurontin is helping his meralgia paresthetica pain.  Oxycodone seems to help his right leg pain more than anything else.  He usually uses this at night.  REVIEW OF SYSTEMS:  Notable for the above.  Full 12-point review is in the written health and history section of the chart.  SOCIAL HISTORY:  Unchanged.  He is now working a bit as he does usually for month or 2 out a year.  He is living with his wife.  PHYSICAL EXAMINATION:  VITAL SIGNS:  Blood pressure is 129/74, pulse is 70, respiratory rate 18, he is satting 95% on room air. GENERAL:  The patient is pleasant.  Gait is much improved in balance. Still some antalgia on the right side. HEART:  Regular. CHEST:  Clear. ABDOMEN:  Soft, nontender.  ASSESSMENT: 1. 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 2. Meralgia paresthetica, left leg. 3. 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  PLAN: 1. Continue with orthotic inserts.  He should become accustomed these     and work on better weight shifting overall. 2. Continue Neurontin 300 mg t.i.d. 3. We will stop fentanyl, begin Opana ER 5 mg q.12 hours with     oxycodone IR 5 mg 1 q.6 hours p.r.n. #60 of each were written     today. 4. I will see him back here in about 1 month.     Ranelle Oyster, M.D. Electronically Signed    ZTS/MedQ D:  03/19/2011 13:59:31  T:  03/20/2011 00:03:16  Job #:  [id number]  cc:   L. Lupe Carney, M.D. Fax: [phone]  Madlyn Frankel. Charlann Boxer, M.D. Fax: [phone]

## 2011-04-12 ENCOUNTER — Encounter: Payer: Medicare Other | Attending: Neurosurgery | Admitting: Neurosurgery

## 2011-04-12 DIAGNOSIS — Y831 Surgical operation with implant of artificial internal device as the cause of abnormal reaction of the patient, or of later complication, without mention of misadventure at the time of the procedure: Secondary | ICD-10-CM | POA: Insufficient documentation

## 2011-04-12 DIAGNOSIS — Z96649 Presence of unspecified artificial hip joint: Secondary | ICD-10-CM | POA: Insufficient documentation

## 2011-04-12 DIAGNOSIS — T84099A Other mechanical complication of unspecified internal joint prosthesis, initial encounter: Secondary | ICD-10-CM | POA: Insufficient documentation

## 2011-04-12 DIAGNOSIS — M79609 Pain in unspecified limb: Secondary | ICD-10-CM | POA: Insufficient documentation

## 2011-04-12 DIAGNOSIS — R209 Unspecified disturbances of skin sensation: Secondary | ICD-10-CM | POA: Insufficient documentation

## 2011-04-12 DIAGNOSIS — M25559 Pain in unspecified hip: Secondary | ICD-10-CM | POA: Insufficient documentation

## 2011-04-12 DIAGNOSIS — R269 Unspecified abnormalities of gait and mobility: Secondary | ICD-10-CM | POA: Insufficient documentation

## 2011-04-12 DIAGNOSIS — M2170 Unequal limb length (acquired), unspecified site: Secondary | ICD-10-CM | POA: Insufficient documentation

## 2011-04-13 ENCOUNTER — Encounter: Payer: Medicare Other | Admitting: Neurosurgery

## 2011-04-13 DIAGNOSIS — M1610 Unilateral primary osteoarthritis, unspecified hip: Secondary | ICD-10-CM

## 2011-04-13 DIAGNOSIS — G8928 Other chronic postprocedural pain: Secondary | ICD-10-CM

## 2011-04-13 NOTE — Assessment & Plan Note (Signed)
This is the patient of Dr. Riley Kill seen for chronic right leg and hip pain.  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 Durene Romans due to the fact that he feels like he cannot get it through the surgery again and he is here to followup for pain management.  He rates his pain about 6 or 7. Activity level is 3-5.  His sleep is fair.  His pain is worse in the morning and evening.  Walking and activity tends to aggravate rest, therapy and medication tend to help.  REVIEW OF SYSTEMS:  Notable for those difficulties described above as well as some night sweats, high blood sugar fluctuations and paresthesias.  He is working 40 hours a week for the summer.  PAST MEDICAL HISTORY:  Unchanged.  SOCIAL HISTORY:  He is married, lives with wife.  FAMILY HISTORY:  Unchanged.  PHYSICAL EXAMINATION:  VITAL SIGNS:  His blood pressure is 145/74, pulse 75, respirations 18, O2 sats 97 on room air. EXTREMITIES:  He gives away in the lower extremities to resistance testing.  He is 5/5 with his motor strength.  Sensation is intact. NEUROLOGIC:  Constitutionally, he is within normal limits.  He is alert and oriented x3.  He does walk with a significant limp.  ASSESSMENT: 1. Right hip pain, status post failed arthroplasty. 2. Myalgia paresthetica, left leg. 3. Leg discrepancy on the right leg of 1.5-cm.  PLAN: 1. I refilled his oxycodone 5 mg one p.o. 6 h. p.r.n. 120 with no     refills. 2. Opana ER 5 mg 1 p.o. q.12 h. #60 with no refill. 3. We will follow him up in the clinic in 1 month.  His questions were     encouraged and answered.     Kristin Lamagna L. [name] Electronically Signed    RLW/MedQ D:  04/12/2011 15:58:19  T:  04/13/2011 04:21:52  Job #:  [id number]

## 2011-05-10 ENCOUNTER — Encounter: Payer: Medicare Other | Attending: Neurosurgery | Admitting: Neurosurgery

## 2011-05-10 DIAGNOSIS — M2170 Unequal limb length (acquired), unspecified site: Secondary | ICD-10-CM | POA: Insufficient documentation

## 2011-05-10 DIAGNOSIS — Z96649 Presence of unspecified artificial hip joint: Secondary | ICD-10-CM | POA: Insufficient documentation

## 2011-05-10 DIAGNOSIS — M25559 Pain in unspecified hip: Secondary | ICD-10-CM

## 2011-05-10 DIAGNOSIS — M79609 Pain in unspecified limb: Secondary | ICD-10-CM | POA: Insufficient documentation

## 2011-05-11 NOTE — Assessment & Plan Note (Signed)
Thomas [name] is a patient of Dr. Riley Kill, seen for chronic right leg pain.  He has got a leg discrepancy there.  After a failed hip arthroplasty he has had a second time with Dr. Durene Romans, he decided not to proceed with any further intervention.  He rates his pain about 6.  It is a stabbing, aching type pain.  Activity level is 2-6, pain is worse in the morning, during the day; walking aggravates, medication tends to help.  He walks without assistance.  He does have a limp.  He climb steps.  He can drive.  He normally walks about 5 minutes at a time.  He is employed. He works about 48 hours a week.  REVIEW OF SYSTEMS:  Notable for difficulties as described above as well as some night sweats, blood sugar fluctuations, and trouble with his ambulation.  PAST MEDICAL HISTORY:  Unchanged.  SOCIAL HISTORY:  He is married.  He lives with his wife.  FAMILY HISTORY:  Unchanged.  PHYSICAL EXAMINATION:  VITAL SIGNS:  His blood pressure is 167/67, pulse 75, respirations 18, O2 sats 97 on room air. MUSCULOSKELETAL:  His quad strength is good in both lower extremities. Sensation is intact. GENERAL:  He is constitutionally within normal limits.  He is alert and oriented x3. EXTREMITIES:  He does walk with a limp, unassisted.  ASSESSMENT: 1. Right hip pain status post failed arthroplasty. 2. Myalgia paresthetica, left leg. 3. Right lower extremity leg discrepancy.  PLAN: 1. Refill oxycodone 5 mg 1 p.o. q.6 hours p.r.n., 60 with no refill. 2. Opana ER 5 mg 1 p.o. q.12 hours, 60 with no refill. 3. He will follow up with Dr. Riley Kill in 1 month.  His questions were     encouraged.     Graylin Sperling L. [name] Electronically Signed    RLW/MedQ D:  05/10/2011 14:46:09  T:  05/11/2011 01:07:56  Job #:  [id number]

## 2011-06-08 ENCOUNTER — Encounter: Payer: Medicare Other | Attending: Physical Medicine & Rehabilitation | Admitting: Physical Medicine & Rehabilitation

## 2011-06-08 DIAGNOSIS — M2170 Unequal limb length (acquired), unspecified site: Secondary | ICD-10-CM | POA: Insufficient documentation

## 2011-06-08 DIAGNOSIS — G5710 Meralgia paresthetica, unspecified lower limb: Secondary | ICD-10-CM | POA: Insufficient documentation

## 2011-06-08 DIAGNOSIS — M1710 Unilateral primary osteoarthritis, unspecified knee: Secondary | ICD-10-CM

## 2011-06-08 DIAGNOSIS — M25559 Pain in unspecified hip: Secondary | ICD-10-CM | POA: Insufficient documentation

## 2011-06-08 DIAGNOSIS — M79609 Pain in unspecified limb: Secondary | ICD-10-CM | POA: Insufficient documentation

## 2011-06-08 NOTE — Assessment & Plan Note (Signed)
HISTORY:  Thomas [name] is back regarding his right leg pain.  He is doing much better with the left leg symptoms of meralgia paresthetica.  The left shoe buildup has helped.  He still having pain when he walks for prolonged periods of time in the right leg and thigh.  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  Some extra time working has affected his pain.  He tolerated the Opana and oxycodone, although they are not lasting long enough.  The patient rates his pain 6/10, described as sharp.  Pain interferes with general activity, relations with others, enjoyment of life on a moderate level.  He has problems sitting for any period of time due to hip pain as well.  REVIEW OF SYSTEMS:  Notable for the above.  Full 12-point review is in the written health and history section of chart.  SOCIAL HISTORY:  Unchanged.  PHYSICAL EXAMINATION:  VITAL SIGNS:  Blood pressure 158/63, pulse 73, respiratory rate 16, and satting 99% on room air. GENERAL:  The patient is pleasant, alert, oriented x3.  Sensation is intact except left anterior and lateral thigh.  He is walking more normally with the shoe lift. HEART:  Regular. CHEST:  Clear. ABDOMEN:  Soft and nontender. NEUROLOGIC:  Cognitively, he is intact.  He had pain in the right hip with palpation and he does have antalgia on the right side, still was uncomfortable while sitting with me today.  ASSESSMENT: 1. Right hip pain status post failed hip arthroplasty with tip of stem     pain syndrome. 2. Meralgia paresthetica, left leg. 3. Right leg length discrepancy.  PLAN: 1. We will increase Opana ER to 10 mg q.12 h.  We will stay with     oxycodone 5 mg q.6 h. p.r.n. #60 of each were written. 2. We will stay with Neurontin for his meralgia paresthetica. 3. Continue titrating meds as needed to achieve pain control.  I will     see him back here in about a month.     Ranelle Oyster, M.D. Electronically Signed    ZTS/MedQ D:   06/08/2011 12:06:06  T:  06/08/2011 13:57:22  Job #:  [id number]  cc:   L. Lupe Carney, M.D. Fax: [phone]

## 2011-07-08 ENCOUNTER — Ambulatory Visit (HOSPITAL_COMMUNITY)
Admission: RE | Admit: 2011-07-08 | Discharge: 2011-07-08 | Disposition: A | Discharge status: Home / Self Care | Payer: Medicare Other | Source: Ambulatory Visit | Attending: Interventional Cardiology | Admitting: Interventional Cardiology

## 2011-07-08 DIAGNOSIS — I708 Atherosclerosis of other arteries: Secondary | ICD-10-CM | POA: Insufficient documentation

## 2011-07-08 DIAGNOSIS — I739 Peripheral vascular disease, unspecified: Secondary | ICD-10-CM

## 2011-07-08 DIAGNOSIS — I70219 Atherosclerosis of native arteries of extremities with intermittent claudication, unspecified extremity: Secondary | ICD-10-CM | POA: Insufficient documentation

## 2011-07-08 HISTORY — DX: Peripheral vascular disease, unspecified: I73.9

## 2011-07-08 LAB — GLUCOSE, CAPILLARY: Glucose-Capillary: 181 mg/dL — ABNORMAL HIGH (ref 70–99)

## 2011-07-09 ENCOUNTER — Emergency Department (HOSPITAL_COMMUNITY): Payer: Medicare Other

## 2011-07-09 ENCOUNTER — Emergency Department (HOSPITAL_COMMUNITY)
Admission: EM | Admit: 2011-07-09 | Discharge: 2011-07-09 | Disposition: A | Discharge status: Home / Self Care | Payer: Medicare Other | Attending: Emergency Medicine | Admitting: Emergency Medicine

## 2011-07-09 ENCOUNTER — Ambulatory Visit: Payer: Medicare Other | Admitting: Physical Medicine & Rehabilitation

## 2011-07-09 DIAGNOSIS — I10 Essential (primary) hypertension: Secondary | ICD-10-CM | POA: Insufficient documentation

## 2011-07-09 DIAGNOSIS — E119 Type 2 diabetes mellitus without complications: Secondary | ICD-10-CM | POA: Insufficient documentation

## 2011-07-09 DIAGNOSIS — Z794 Long term (current) use of insulin: Secondary | ICD-10-CM | POA: Insufficient documentation

## 2011-07-09 DIAGNOSIS — I2510 Atherosclerotic heart disease of native coronary artery without angina pectoris: Secondary | ICD-10-CM | POA: Insufficient documentation

## 2011-07-09 DIAGNOSIS — M2420 Disorder of ligament, unspecified site: Secondary | ICD-10-CM | POA: Insufficient documentation

## 2011-07-09 DIAGNOSIS — R209 Unspecified disturbances of skin sensation: Secondary | ICD-10-CM | POA: Insufficient documentation

## 2011-07-09 DIAGNOSIS — E785 Hyperlipidemia, unspecified: Secondary | ICD-10-CM | POA: Insufficient documentation

## 2011-07-09 DIAGNOSIS — M629 Disorder of muscle, unspecified: Secondary | ICD-10-CM | POA: Insufficient documentation

## 2011-07-09 DIAGNOSIS — R42 Dizziness and giddiness: Secondary | ICD-10-CM | POA: Insufficient documentation

## 2011-07-09 LAB — COMPREHENSIVE METABOLIC PANEL
AST: 34 U/L (ref 0–37)
Alkaline Phosphatase: 59 U/L (ref 39–117)
CO2: 25 mEq/L (ref 19–32)
Chloride: 101 mEq/L (ref 96–112)
Creatinine, Ser: 0.93 mg/dL (ref 0.50–1.35)
GFR calc non Af Amer: >60 mL/min (ref >60)
Total Bilirubin: 0.4 mg/dL (ref 0.3–1.2)

## 2011-07-09 LAB — DIFFERENTIAL
Eosinophils Absolute: 0.1 10*3/uL (ref 0.0–0.7)
Lymphocytes Relative: 31 % (ref 12–46)
Lymphs Abs: 1.5 10*3/uL (ref 0.7–4.0)
Neutrophils Relative %: 59 % (ref 43–77)

## 2011-07-09 LAB — CBC
HCT: 37 % — ABNORMAL LOW (ref 39.0–52.0)
MCV: 89.4 fL (ref 78.0–100.0)
Platelets: 126 10*3/uL — ABNORMAL LOW (ref 150–400)
RBC: 4.14 MIL/uL — ABNORMAL LOW (ref 4.22–5.81)
WBC: 5 10*3/uL (ref 4.0–10.5)

## 2011-07-13 ENCOUNTER — Encounter: Payer: Medicare Other | Attending: Neurosurgery | Admitting: Neurosurgery

## 2011-07-13 DIAGNOSIS — M2170 Unequal limb length (acquired), unspecified site: Secondary | ICD-10-CM

## 2011-07-13 DIAGNOSIS — Z96649 Presence of unspecified artificial hip joint: Secondary | ICD-10-CM | POA: Insufficient documentation

## 2011-07-13 DIAGNOSIS — M24559 Contracture, unspecified hip: Secondary | ICD-10-CM

## 2011-07-13 DIAGNOSIS — Y831 Surgical operation with implant of artificial internal device as the cause of abnormal reaction of the patient, or of later complication, without mention of misadventure at the time of the procedure: Secondary | ICD-10-CM | POA: Insufficient documentation

## 2011-07-13 DIAGNOSIS — G5710 Meralgia paresthetica, unspecified lower limb: Secondary | ICD-10-CM | POA: Insufficient documentation

## 2011-07-13 DIAGNOSIS — M549 Dorsalgia, unspecified: Secondary | ICD-10-CM | POA: Insufficient documentation

## 2011-07-13 DIAGNOSIS — T84099A Other mechanical complication of unspecified internal joint prosthesis, initial encounter: Secondary | ICD-10-CM | POA: Insufficient documentation

## 2011-07-13 DIAGNOSIS — M79609 Pain in unspecified limb: Secondary | ICD-10-CM | POA: Insufficient documentation

## 2011-07-13 DIAGNOSIS — I82890 Acute embolism and thrombosis of other specified veins: Secondary | ICD-10-CM | POA: Insufficient documentation

## 2011-07-13 NOTE — Assessment & Plan Note (Signed)
ACCOUNT:  [id number].  This is a patient of Dr. Riley Kill seen for back and leg pain.  He states he did have lower extremity vascular studies done last week and states he does have a complete blockage and the doctor does not want to do surgery, he did not tell me who the doctor was.  He does have some collateral circulation that helps with his claudication symptoms, but he states that he does have to follow up with him to discuss surgery.  His pain level right now is 5-6.  It is intermittent and stabbing-type pain. General activity level is 5-7.  Pain is worse in the morning.  Mobility walks without assistance.  He climb steps and drive.  Pain medicine revived some relief.  He is unemployed.  REVIEW OF SYSTEMS:  Notable for difficulties described above as well as some night sweats, hives and low blood sugars, otherwise no changes.  PAST MEDICAL HISTORY, SOCIAL HISTORY AND FAMILY HISTORY:  Unchanged.  PHYSICAL EXAMINATION:  Blood pressure is 128/69, pulse 77, respirations 16, O2 sats 96% on room air.  His motor strength and sensation are intact.  Constitution is within normal limits.  He is alert and oriented x3.  His gait is slightly altered due to pain.  ASSESSMENT: 1. 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 2. Meralgia paresthetica, left leg. 3. Right leg discrepancy. 4. Vascular claudication with pending correction with surgery.  PLAN: 1. Refill Opana ER 10 mg one p.o. q.12 h., #60 with no refill. 2. Oxycodone 5 mg one p.o. q.6 h. p.r.n., #60 with no refill.     Continue his other medicines as prescribed. 3. His questions were encouraged and answered.  We will follow up with     him in 1 month.     Lonya Johannesen L. [name] Electronically Signed    RLW/MedQ D:  07/13/2011 11:36:16  T:  07/13/2011 23:38:08  Job #:  [id number]

## 2011-07-16 LAB — BASIC METABOLIC PANEL
BUN: 16
Calcium: 8.3 — ABNORMAL LOW
Creatinine, Ser: 0.83
GFR calc non Af Amer: >60
Glucose, Bld: 155 — ABNORMAL HIGH
Sodium: 137

## 2011-07-16 LAB — CBC
Hemoglobin: 13.5
Platelets: 127 — ABNORMAL LOW
RDW: 11.8

## 2011-07-23 LAB — BASIC METABOLIC PANEL
Calcium: 8.6
GFR calc Af Amer: >60
GFR calc non Af Amer: >60
Potassium: 3.7
Sodium: 137

## 2011-07-23 LAB — CBC
HCT: 39.5
Hemoglobin: 14
RBC: 4.26
WBC: 8.9

## 2011-07-23 LAB — CK TOTAL AND CKMB (NOT AT ARMC)
CK, MB: 11.2 — ABNORMAL HIGH
Relative Index: 7.8 — ABNORMAL HIGH

## 2011-07-23 LAB — TROPONIN I: Troponin I: 0.55

## 2011-08-09 NOTE — Procedures (Signed)
NAME:  Thomas [name], SINE NO.:  [id number]  MEDICAL RECORD NO.:  [id number]  LOCATION:  [facility]                         FACILITY:  [facility]  PHYSICIAN:  Corky Crafts, MDDATE OF BIRTH:  10-May-1941  DATE OF PROCEDURE:  07/08/2011 DATE OF DISCHARGE:  07/08/2011                   PERIPHERAL VASCULAR INVASIVE PROCEDURE   PRIMARY CARE PHYSICIAN:  L. Lupe Carney, MD.  PROCEDURE PERFORMED:  Abdominal aortogram, pelvic angiogram, right lower extremity runoff, selective left lower extremity runoff.  OPERATOR:  Corky Crafts, MD.  INDICATIONS:  Left leg claudication.  PROCEDURE NARRATIVE:  The patient was brought to the [facility] Lab and prepped and draped in the usual sterile fashion after informed consent was obtained.  Initially, his left groin was infiltrated with 1% lidocaine. The needle punctured the common femoral artery and there was good bleedback, however, a wire could not be advanced up into the iliac system, therefore, we stopped and moved our attention to the right groin, a 5-French sheath was placed into the right common femoral artery using modified Seldinger technique without any trouble.  A pigtail catheter was advanced to the aorta at the level of renal arteries and power injection of contrast was performed in the AP projection.  The catheter was pulled back to the level of the aortoiliac bifurcation and power injection of contrast was performed there.  Subsequently, a crossover catheter was used to obtain access to the left iliac system, a 4-French end-hole catheter was placed into the left common iliac.  A selective left lower extremity angiogram was performed.  Oblique views of the left iliac system were also performed.  Subsequently, his selective right lower extremity runoff was performed through the sheath. The sheath was removed using manual compression for hemostasis.  FINDINGS:  The bilateral single renal arteries were widely patent. 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  There is mild diffuse atherosclerosis in the aorta.  Right leg, the right common iliac artery was patent.  The right internal iliac was patent.  The right external iliac artery had moderate diffuse atherosclerosis, but no significant lesions.  The right common femoral and right superficial femoral artery had mild atherosclerosis.  The right popliteal artery was widely patent.  The right anterior tibial artery was occluded.  The right posterior tibial artery was widely patent.  The right peroneal artery was small but patent.  Left leg:  The left common iliac artery was widely patent.  The left external iliac artery was occluded in the proximal portion of the vessel.  The left internal iliac artery was patent and there were large collateral vessels which reconstituted at the level of the left common femoral artery.  The left superficial femoral artery was patent.  There was mild atherosclerosis.  The left popliteal had a moderate focal calcific lesion.  Left anterior tibial was occluded.  Left peroneal artery was patent.  The left posterior tibial artery was patent.  IMPRESSION: 1. 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 2. Moderate left popliteal disease which is quite calcified. 3. Mild  to moderate right external iliac disease. 4. Two-vessel runoff bilaterally.  RECOMMENDATIONS:  I do not think he is a good candidate for primary stenting since his disease involves the left common femoral artery.  We will consider medical therapy versus surgical intervention.  I will discuss the case with Dr. Myra Gianotti.  We will try to see if there would be a hybrid procedure that would be possible, perhaps stenting the external iliac followed by an endarterectomy of the common femoral and very distal  external iliac artery.  Continue aggressive medical therapy.     Corky Crafts, MD  cc: Dr. Myra Gianotti     JSV/MEDQ  D:  07/15/2011  T:  07/15/2011  Job:  [id number]  Electronically Signed by Lance Muss MD on 08/09/2011 01:17:26 PM

## 2011-08-10 ENCOUNTER — Encounter: Payer: Medicare Other | Attending: Neurosurgery | Admitting: Neurosurgery

## 2011-08-10 DIAGNOSIS — G5710 Meralgia paresthetica, unspecified lower limb: Secondary | ICD-10-CM | POA: Insufficient documentation

## 2011-08-10 DIAGNOSIS — M2170 Unequal limb length (acquired), unspecified site: Secondary | ICD-10-CM | POA: Insufficient documentation

## 2011-08-10 DIAGNOSIS — T84099A Other mechanical complication of unspecified internal joint prosthesis, initial encounter: Secondary | ICD-10-CM | POA: Insufficient documentation

## 2011-08-10 DIAGNOSIS — M549 Dorsalgia, unspecified: Secondary | ICD-10-CM | POA: Insufficient documentation

## 2011-08-10 DIAGNOSIS — M25559 Pain in unspecified hip: Secondary | ICD-10-CM

## 2011-08-10 DIAGNOSIS — M79609 Pain in unspecified limb: Secondary | ICD-10-CM | POA: Insufficient documentation

## 2011-08-10 DIAGNOSIS — Y831 Surgical operation with implant of artificial internal device as the cause of abnormal reaction of the patient, or of later complication, without mention of misadventure at the time of the procedure: Secondary | ICD-10-CM | POA: Insufficient documentation

## 2011-08-11 NOTE — Assessment & Plan Note (Signed)
This is a patient of Dr. Riley Kill seen for back and leg pain.  He reports no change in his pain today.  He did take a fall while he was moving from one house to another couple of weeks ago and thinks he may have hurt his ankle or foot but overall doing okay.  He rates his pain at a 4- 6.  He has an aching type pain.  General activity level is 3-6.  Pain is worse with walking.  Medication tends to help.  He walks without assistance.  He can walk 15 minutes at a time.  Functionally he is retired.  REVIEW OF SYSTEMS:  Notable for difficulties described above as well as some high blood sugar, otherwise within normal limits.  No apparent aberrant behaviors.  UDS to be collected at next appointment.  PAST MEDICAL HISTORY/SOCIAL HISTORY/FAMILY HISTORY:  Unchanged.  PHYSICAL EXAMINATION:  His blood pressure 138/78, pulse 82, respirations 18, O2 sats 97 on room air.  His motor strength, sensation intact. Constitutionally he is within normal limits.  He is alert and oriented x3.  His gait is slightly altered due to his foot and ankle injury.  ASSESSMENT: 1. 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 2. Meralgia paresthetica, left leg 3. Right leg discrepancy.  PLAN: 1. Refill Opana ER 10 mg 1 p.o. q.12 hours, #60 with no refill. 2. Oxycodone IR 5 mg, 1 p.o. q.6 hours p.r.n., #120 with no refill.  His questions were encouraged and answered.  He will see Dr. Riley Kill as scheduled next month.     Thomas [name] L. [name] Electronically Signed    RLW/MedQ D:  08/10/2011 12:20:31  T:  08/10/2011 19:34:22  Job #:  [id number]

## 2011-08-16 ENCOUNTER — Encounter: Payer: Self-pay | Admitting: Surgery

## 2011-08-20 ENCOUNTER — Encounter: Payer: Self-pay | Admitting: Surgery

## 2011-08-23 ENCOUNTER — Encounter: Payer: Self-pay | Admitting: Surgery

## 2011-08-23 ENCOUNTER — Ambulatory Visit (INDEPENDENT_AMBULATORY_CARE_PROVIDER_SITE_OTHER): Payer: Medicare Other | Admitting: Surgery

## 2011-08-23 VITALS — BP 117/61 | HR 71 | Resp 16 | Ht 71.0 in | Wt 199.0 lb

## 2011-08-23 DIAGNOSIS — I7092 Chronic total occlusion of artery of the extremities: Secondary | ICD-10-CM

## 2011-08-23 DIAGNOSIS — G5790 Unspecified mononeuropathy of unspecified lower limb: Secondary | ICD-10-CM

## 2011-08-23 DIAGNOSIS — I745 Embolism and thrombosis of iliac artery: Secondary | ICD-10-CM

## 2011-08-23 DIAGNOSIS — I70219 Atherosclerosis of native arteries of extremities with intermittent claudication, unspecified extremity: Secondary | ICD-10-CM

## 2011-08-23 MED ORDER — CILOSTAZOL 100 MG PO TABS: 100.0000 mg | ORAL_TABLET | Freq: Two times a day (BID) | ORAL | Status: DC

## 2011-08-23 NOTE — Progress Notes (Signed)
[facility] [place]   Patient name: Thomas [name] MRN: [id number] DOB: 12/30/1940 Sex: male   Referred by: Dr. Eldridge Dace  Reason for referral:  Chief Complaint  Patient presents with  . New Evaluation    Left external iliac occlusion,  REF->> Dr. Eldridge Dace    HISTORY OF PRESENT ILLNESS: This is a very pleasant gentleman that I am seeing for evaluation of left leg claudication. The patient states that he has had symptoms over the past 7-8 years, and they have been getting progressively worse. He describes feeling as if his calf is getting ready to explode after walking approximately 5 minutes which is the equivalent of one half of a block. The rest alleviate his symptoms and walking particularly up steps or on an incline exacerbates them.He denies having ulcers or rest pain.  The patient is a diabetic his most recent hemoglobin A1c was close to 8. He states that he has increased his insulin and his blood sugars have been improving. He has a history of hypertension which is well-controlled medically as is his hypercholesterolemia. He is a former smoker but has not smoked in quite some time.   Past Medical History  Diagnosis Date  . Diabetes mellitus   . CAD (coronary artery disease)   . Hypertension   . Leg pain   . Carotid artery occlusion   . Hyperlipidemia   . AAA (abdominal aortic aneurysm)   . 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 Shoulder surgery     right  . Carpal tunnel release 2006    right   . Hip fracture surgery 11/2007    right  . Joint replacement 2009    right total hip replacement  . Coronary angioplasty with stent placement     History   Social History  . Marital Status: Married    Spouse Name: N/A    Number of Children: N/A  . Years of Education: N/A   Occupational History  . Not on file.   Social History Main Topics  . Smoking status: Former Smoker   Types: Cigarettes    Quit date: 10/11/2005  . Smokeless tobacco: Not on file  . Alcohol Use: Yes     occasionally  . Drug Use: No  . Sexually Active:    Other Topics Concern  . Not on file   Social History Narrative  . No narrative on file    Family History  Problem Relation Age of Onset  . Emphysema Mother   . Cancer Father     Brain  . Diabetes Son     Allergies as of 08/23/2011 - Review Complete 08/23/2011  Allergen Reaction Noted  . Metformin and related Diarrhea 08/16/2011  . Nsaids  08/16/2011    Current Outpatient Prescriptions on File Prior to Visit  Medication Sig Dispense Refill  . aspirin EC 81 MG tablet Take 81 mg by mouth daily.        . fenofibrate micronized (LOFIBRA) 134 MG capsule Take 134 mg by mouth daily before breakfast.        . fish oil-omega-3 fatty acids 1000 MG capsule Take 2 g by mouth daily.        Marland Kitchen gabapentin (NEURONTIN) 300 MG capsule Take 300 mg by mouth 3 (three) times daily.        Marland Kitchen glipiZIDE (GLUCOTROL) 10 MG tablet Take 10 mg by mouth daily.        Marland Kitchen  insulin glargine (LANTUS) 100 UNIT/ML injection Inject 60 Units into the skin at bedtime.        . isosorbide dinitrate (ISORDIL) 30 MG tablet Take 30 mg by mouth daily.        Marland Kitchen lisinopril (PRINIVIL,ZESTRIL) 40 MG tablet Take 40 mg by mouth daily.        . metFORMIN (GLUCOPHAGE) 500 MG tablet Take 1,000 mg by mouth daily.        . metoprolol tartrate (LOPRESSOR) 25 MG tablet Take 25 mg by mouth daily.        . metroNIDAZOLE (FLAGYL) 250 MG tablet Take 250 mg by mouth 4 (four) times daily as needed.        . nitroGLYCERIN (NITROSTAT) 0.4 MG SL tablet Place 0.4 mg under the tongue every 5 (five) minutes as needed.        . OxyCODONE HCl, Abuse Deter, 5 MG TABS Take 1 tablet by mouth as needed.       Marland Kitchen oxymorphone (OPANA ER) 5 MG 12 hr tablet Take 5 mg by mouth as needed.       . rosuvastatin (CRESTOR) 10 MG tablet Take 10 mg by mouth daily.        . sitaGLIPtin (JANUVIA) 100 MG tablet Take  100 mg by mouth daily.        Marland Kitchen zolpidem (AMBIEN) 10 MG tablet Take 10 mg by mouth at bedtime as needed.           REVIEW OF SYSTEMS: Cardiovascular: No chest pain, chest pressure, palpitations, orthopnea, or dyspnea on exertion. No history of DVT or phlebitis. Positive for left leg claudication Pulmonary: No productive cough, asthma or wheezing. Neurologic: No weakness, paresthesias, aphasia, or amaurosis. No dizziness. Hematologic: No bleeding problems or clotting disorders. Musculoskeletal: No joint pain or joint swelling. Gastrointestinal: No blood in stool or hematemesis Genitourinary: No dysuria or hematuria. Psychiatric:: No history of major depression. Integumentary: No rashes or ulcers. Constitutional: No fever or chills.  PHYSICAL EXAMINATION: General: The patient appears their stated age.  Vital signs are BP 117/61  Pulse 71  Resp 16  Ht 5\' 11"  (1.803 m)  Wt 199 lb (90.266 kg)  BMI 27.75 kg/m2  SpO2 92% Pulmonary: Respirations are non-labored Abdomen: Soft and non-tender with normal pitch bowel sounds. Musculoskeletal: There are no major deformities.   Neurologic: No focal weakness or paresthesias are detected, Skin: There are no ulcer or rashes noted. Psychiatric: The patient has normal affect. Cardiovascular: There is a regular rate and rhythm without significant murmur appreciated. Bilateral carotid bruits palpable right pedal pulse nonpalpable left  Diagnostic Studies: Outside carotid ultrasound shows approximately 50% bilateral carotid stenosis  Outside Studies/Documentation Historical records were reviewed.  They showed an angiogram which I have reviewed revealing an occluded left external iliac artery  Medication Changes: Cilostazol was added  Assessment:  Left leg claudication Plan: I discussed all surgical nonsurgical and hyperglossal is with the patient. We discussed aortobifemoral bypass graft, right to left femoral-femoral bypass graft left common  iliac to left common femoral bypass graft and a hybrid femoral endarterectomy with subintimal recanalization of the external iliac artery and stent placement. We also discussed the role of medical therapy. After our discussion we have agreed to begin with medical management. I placed him on 100 mg twice daily of cilostazol. The patient will followup with me in 3 months to assess how he is doing. We will make future recommendations based on how he responds to medical treatment  Eldridge Abrahams, M.D. [facility] [place] Office: [phone] Pager:  [phone]

## 2011-08-24 ENCOUNTER — Encounter: Payer: Self-pay | Admitting: Interventional Cardiology

## 2011-09-07 ENCOUNTER — Encounter: Payer: Medicare Other | Attending: Physical Medicine & Rehabilitation | Admitting: Physical Medicine & Rehabilitation

## 2011-09-07 DIAGNOSIS — M2170 Unequal limb length (acquired), unspecified site: Secondary | ICD-10-CM

## 2011-09-07 DIAGNOSIS — M961 Postlaminectomy syndrome, not elsewhere classified: Secondary | ICD-10-CM

## 2011-09-07 DIAGNOSIS — Z96649 Presence of unspecified artificial hip joint: Secondary | ICD-10-CM | POA: Insufficient documentation

## 2011-09-07 DIAGNOSIS — M1710 Unilateral primary osteoarthritis, unspecified knee: Secondary | ICD-10-CM

## 2011-09-07 DIAGNOSIS — M549 Dorsalgia, unspecified: Secondary | ICD-10-CM | POA: Insufficient documentation

## 2011-09-07 DIAGNOSIS — G5710 Meralgia paresthetica, unspecified lower limb: Secondary | ICD-10-CM

## 2011-09-07 DIAGNOSIS — M25559 Pain in unspecified hip: Secondary | ICD-10-CM | POA: Insufficient documentation

## 2011-09-07 DIAGNOSIS — M79609 Pain in unspecified limb: Secondary | ICD-10-CM | POA: Insufficient documentation

## 2011-09-07 NOTE — Assessment & Plan Note (Signed)
He is back regarding his back and leg pain.  He has been doing fairly well for the most part.  The Opana as effective.  He does note the oxycodone is as affective at this point anymore.  Pain is around 6/10. He recently moved to [place] and "tested his exercise tolerance" and states that it was quite stressful, but he is back to baseline.  He tries to walk daily, but can only get up to about 15 minutes at a time. He is in [place] now.  [place] on going to and taking advantage of their pool.  REVIEW OF SYSTEMS:  Notable for the above.  He does report some vascular deficiency in the left leg, which he may need intervention for at some point.  SOCIAL HISTORY:  Unchanged.  He is married.  PHYSICAL EXAMINATION:  VITAL SIGNS:  Blood pressure is 145/77, pulse 93, respirations 16, satting 96% on room air. NEUROLOGIC:  The patient is pleasant, alert.  He has some pain and antalgia still with right-sided weightbearing.  He is especially unchanged, appears fairly tolerant of basic gait.  Left leg still has decreased sensation over the anterolateral aspect of the thigh.  Left foot is neurovascularly intact. HEART:  Regular. CHEST:  Clear. ABDOMEN:  Soft, nontender.  ASSESSMENT: 1. 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 2. Meralgia paresthetica, left leg. 3. Right leg length discrepancy.  PLAN: 1. Continue Opana ER 10 mg q.12 h., #60. 2. Oxycodone was changed to Opana IR 5 mg 1 q.8 h. p.r.n., #90. 3. We will stay with his Neurontin. 4. We discussed multiple exercise modalities, and I think the pool     will be a great place for him for nonimpact activities to     strengthen his core and lower extremities, and also to promote good     vascular health for his left leg. 5. He will see my nurse back in a month, and I will see him back in     about 5.     Ranelle Oyster, M.D. Electronically Signed    ZTS/MedQ D:  09/07/2011  11:55:44  T:  09/07/2011 20:16:34  Job #:  [id number]

## 2011-10-13 ENCOUNTER — Encounter: Payer: Medicare Other | Attending: Neurosurgery | Admitting: Neurosurgery

## 2011-10-13 DIAGNOSIS — M25559 Pain in unspecified hip: Secondary | ICD-10-CM | POA: Insufficient documentation

## 2011-10-13 DIAGNOSIS — G5710 Meralgia paresthetica, unspecified lower limb: Secondary | ICD-10-CM | POA: Insufficient documentation

## 2011-10-13 DIAGNOSIS — Z96649 Presence of unspecified artificial hip joint: Secondary | ICD-10-CM | POA: Insufficient documentation

## 2011-10-13 DIAGNOSIS — M545 Low back pain, unspecified: Secondary | ICD-10-CM | POA: Insufficient documentation

## 2011-10-13 DIAGNOSIS — M2170 Unequal limb length (acquired), unspecified site: Secondary | ICD-10-CM | POA: Insufficient documentation

## 2011-10-13 DIAGNOSIS — IMO0001 Reserved for inherently not codable concepts without codable children: Secondary | ICD-10-CM

## 2011-10-14 NOTE — Assessment & Plan Note (Signed)
This is a patient of Dr. Riley Kill, seen for low back pain, right hip pain, and leg discrepancy.  He rates his average pain is 6 to an 8, sharp, stabbing type pain.  General activity level is 6-8.  Pain same 24 hours a day.  Walking, bending, as sitting aggravates.  Rest, heat, and medication helps.  Mobility, walks without assistance.  He climb steps and drives and walk about 10 minutes at a time.  Functionally, he is retired.  REVIEW OF SYSTEMS:  Notable for difficulties described above as well as some nausea, abdominal pain, trouble walking, tremors, no suicidal thoughts or aberrant behaviors.  Pill counts and UDS consistent.  He states that he feels like that the Opana has to release, but Dr. Riley Kill switched him to.  It is not working, he is requested to go back to his oxycodone.  PAST MEDICAL HISTORY, SOCIAL HISTORY, FAMILY HISTORY:  Unchanged.  PHYSICAL EXAMINATION:  Blood pressure 137/59, pulse 66, respirations 16, O2 sats 98 on room air.  His motor strength and sensation intact. Constitutionally, he is within normal limits.  He is alert and oriented x3.  He has a slight limp.  He has point tender over the right greater trochanter area, although he has had an arthroplasty there and he has an injections in the past with Dr. Charlann Boxer, [facility].  ASSESSMENT: 1. Failed hip arthroplasty, right side with stent pain syndrome. 2. Meralgia paresthetica, left leg. 3. Right leg length discrepancy.  PLAN: 1. Refill Opana ER 10 mg 1 p.o. q.12 hours, 60 with no refill. 2. We are going to switch back to oxycodone IR 5 mg 1 p.o. q.8 hours     p.r.n., 90 with no refill.  PROCEDURE NOTE:  After an informed consent, risks and benefits were explained, and proper consent was obtained.  We injected his right greater trochanter area with 3 mL of lidocaine, 1 mL of Depo-Medrol, 40 mg.  There was no blood drawback.  He tolerated it well.  He knows to ice that area tonight.  He will follow  up with Dr. Charlann Boxer as needed and follow up here in 1 month.     Henri Guedes L. [name] Electronically Signed    RLW/MedQ D:  10/13/2011 13:21:37  T:  10/14/2011 02:12:45  Job #:  [id number]

## 2011-11-03 ENCOUNTER — Encounter: Payer: Medicare Other | Attending: Physical Medicine & Rehabilitation | Admitting: Physical Medicine & Rehabilitation

## 2011-11-03 DIAGNOSIS — G5710 Meralgia paresthetica, unspecified lower limb: Secondary | ICD-10-CM | POA: Insufficient documentation

## 2011-11-03 DIAGNOSIS — M79609 Pain in unspecified limb: Secondary | ICD-10-CM | POA: Insufficient documentation

## 2011-11-03 DIAGNOSIS — Y831 Surgical operation with implant of artificial internal device as the cause of abnormal reaction of the patient, or of later complication, without mention of misadventure at the time of the procedure: Secondary | ICD-10-CM | POA: Insufficient documentation

## 2011-11-03 DIAGNOSIS — M76899 Other specified enthesopathies of unspecified lower limb, excluding foot: Secondary | ICD-10-CM

## 2011-11-03 DIAGNOSIS — T84099A Other mechanical complication of unspecified internal joint prosthesis, initial encounter: Secondary | ICD-10-CM | POA: Insufficient documentation

## 2011-11-03 DIAGNOSIS — Z96649 Presence of unspecified artificial hip joint: Secondary | ICD-10-CM | POA: Insufficient documentation

## 2011-11-03 DIAGNOSIS — M2170 Unequal limb length (acquired), unspecified site: Secondary | ICD-10-CM

## 2011-11-03 DIAGNOSIS — IMO0002 Reserved for concepts with insufficient information to code with codable children: Secondary | ICD-10-CM

## 2011-11-03 DIAGNOSIS — M1710 Unilateral primary osteoarthritis, unspecified knee: Secondary | ICD-10-CM

## 2011-11-03 NOTE — Assessment & Plan Note (Signed)
Thomas [name] is back regarding his multiple pain complaints.  He states over the last few days, he has had increased onset of right leg pain.  He saw my nurse practitioner in early January, who injected around the greater trochanter.  He stated that this helped for a few days, but pain has been very much worse over the last few days.  He saw Durene Romans yesterday who did x-rays of the back and saw degenerative disk disease over the lower lumbar segments.  MRI was performed yesterday evening. He describes pain is aching, dull, sharp raising from low back, particularly the right hip to the foot.  He has hard time bearing weight.  He has a hard time sitting on the right leg comfortably.  He states that this pain is different from his other pain, he was complaining about previously.  He has not tried anything new for a pain relief at this point.  The oxycodone is not helping.  His Opana helps some of his general pain, but is not touching this pain currently.  He states that his insurance will no longer cover Opana with the next prescription.  REVIEW OF SYSTEMS:  Notable for the above.  Full 12-point review is in the written health and history section of the chart.  SOCIAL HISTORY:  The patient is married.  Wife is present with him today and appears supportive.  PHYSICAL EXAMINATION:  VITAL SIGNS:  Blood pressure is 171/67, pulse 107, respiratory rate is 16 and he is satting 96% on room air. GENERAL:  The patient is pleasant, but appears to be in distress.  H EXTREMITIES:  He has a hard time sitting on the right hip in seated position.  He had stand up and he has significant antalgia on the right side.  I had him take a few steps forward and he frankly lost his balance.  He had to gather his walker to allow him to support himself. With standing, he had pain over the lower lumbar spine segments into the right PSIS area and buttock.  Right greater trochanter was significantly tender.  I really  could not perform any provocative maneuvers on the leg as he did not tolerate them due to pain.  He had pain with any type of active and resisted movement of the right hip and knee.  Strength seemed to be grossly preserved around it as was sensory function.  Reflexes are 1+.  HEART:  Tachycardic. CHEST:  Clear. ABDOMEN:  Soft, nontender.  ASSESSMENT: 1. 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 2. Meralgia paresthetica, left leg. 3. New right leg pain which is suspicious for radiculopathy.  May have     contribution from trochanteric bursitis. 4. Right leg length discrepancy.  PLAN: 1. I increased the patient's Neurontin to 600 mg ultimately t.i.d. 2. I increased oxycodone IR 10 mg 1 q.6 h. p.r.n. #120. 3. We will stay with the Opana for the moment pending pain control of     this new problem.  May need to look at something like morphine     controlled release for baseline pain control. 4. I offered right hip injection but the patient decided to hold off     at the moment.  We will use ice to the hip. 5. MRI results pending. 6. The patient needs to decide, where he wants his care for this     problem.  I told him that this is a nonsurgical situation in     regards to his  back and we should be providing his care for this     problem.  He tended to agree. 7. I will have him scheduled for a month to see me back, but certainly     we will work him in sooner for interventions as appropriate.     Ranelle Oyster, M.D. Electronically Signed    ZTS/MedQ D:  11/03/2011 10:05:44  T:  11/03/2011 17:56:05  Job #:  [id number]  cc:   Madlyn Frankel Charlann Boxer, M.D. Fax: [phone]

## 2011-11-15 ENCOUNTER — Other Ambulatory Visit: Payer: Self-pay | Admitting: Orthopedic Surgery

## 2011-11-15 ENCOUNTER — Ambulatory Visit: Payer: Medicare Other | Admitting: Physical Medicine & Rehabilitation

## 2011-11-15 DIAGNOSIS — M549 Dorsalgia, unspecified: Secondary | ICD-10-CM

## 2011-11-16 ENCOUNTER — Other Ambulatory Visit: Payer: Self-pay | Admitting: Orthopedic Surgery

## 2011-11-16 ENCOUNTER — Ambulatory Visit
Admission: RE | Admit: 2011-11-16 | Discharge: 2011-11-16 | Disposition: A | Discharge status: Home / Self Care | Payer: Medicare Other | Source: Ambulatory Visit | Attending: Orthopedic Surgery | Admitting: Orthopedic Surgery

## 2011-11-16 DIAGNOSIS — M549 Dorsalgia, unspecified: Secondary | ICD-10-CM

## 2011-11-16 DIAGNOSIS — M5126 Other intervertebral disc displacement, lumbar region: Secondary | ICD-10-CM

## 2011-11-16 MED ORDER — METHYLPREDNISOLONE ACETATE 40 MG/ML INJ SUSP (RADIOLOG
120.0000 mg | Freq: Once | INTRAMUSCULAR | Status: AC
  Administered 2011-11-16: 120 mg via EPIDURAL

## 2011-11-16 MED ORDER — IOHEXOL 180 MG/ML  SOLN
1.0000 mL | Freq: Once | INTRAMUSCULAR | Status: AC | PRN
  Administered 2011-11-16: 1 mL via EPIDURAL

## 2011-11-26 ENCOUNTER — Encounter: Payer: Self-pay | Admitting: Surgery

## 2011-11-29 ENCOUNTER — Ambulatory Visit: Payer: Medicare Other | Admitting: Surgery

## 2011-12-01 ENCOUNTER — Other Ambulatory Visit: Payer: Self-pay | Admitting: Orthopedic Surgery

## 2011-12-01 ENCOUNTER — Encounter: Payer: Medicare Other | Attending: Neurosurgery | Admitting: Physical Medicine & Rehabilitation

## 2011-12-01 ENCOUNTER — Encounter: Payer: Self-pay | Admitting: Physical Medicine & Rehabilitation

## 2011-12-01 DIAGNOSIS — M545 Low back pain: Secondary | ICD-10-CM

## 2011-12-01 DIAGNOSIS — G5710 Meralgia paresthetica, unspecified lower limb: Secondary | ICD-10-CM

## 2011-12-01 DIAGNOSIS — M1610 Unilateral primary osteoarthritis, unspecified hip: Secondary | ICD-10-CM

## 2011-12-01 DIAGNOSIS — M5126 Other intervertebral disc displacement, lumbar region: Secondary | ICD-10-CM

## 2011-12-01 DIAGNOSIS — M2170 Unequal limb length (acquired), unspecified site: Secondary | ICD-10-CM

## 2011-12-01 DIAGNOSIS — M5116 Intervertebral disc disorders with radiculopathy, lumbar region: Secondary | ICD-10-CM

## 2011-12-01 MED ORDER — OXYCODONE HCL 10 MG PO TABS: 10.0000 mg | ORAL_TABLET | Freq: Four times a day (QID) | ORAL | Status: DC | PRN

## 2011-12-01 NOTE — Patient Instructions (Signed)
Look in your formulary for long-acting pain meds. They will usually have an "ER" or "SR" in the name.

## 2011-12-01 NOTE — Progress Notes (Signed)
  Subjective:    Patient ID: Thomas [name], male    DOB: 1941/02/17, 71 y.o.   MRN: [id number]  Hip Pain  Incident onset: chronic after hip replacement 2009. There was no injury mechanism. The pain is present in the right hip and right thigh. The quality of the pain is described as aching and shooting. The pain is at a severity of 6/10. The pain is moderate. The pain has been constant since onset. Associated symptoms include an inability to bear weight. Associated symptoms comments: Difficulty with weight bearing at times. He reports no foreign bodies present. The symptoms are aggravated by movement and weight bearing. He has tried NSAIDs, non-weight bearing, ice and rest for the symptoms. The treatment provided mild relief.  Leg Pain  Incident onset: related to hip pain and same symptoms. The pain is at a severity of 6/10. The pain is moderate. The pain has been constant since onset. Associated symptoms include an inability to bear weight. He reports no foreign bodies present. The symptoms are aggravated by movement and weight bearing. He has tried ice, immobilization, non-weight bearing and rest for the symptoms. The treatment provided mild relief.   Lower lumbar degenerative discs and potentially some stenosis affecting the lumbar nerve roots second hand per pt on MRI done at [facility]. Tentative plan for ESI.  Review of Systems  Musculoskeletal: Positive for back pain and gait problem.  All other systems reviewed and are negative.  right leg pain less severe but still present     Objective:   Physical Exam  Constitutional: He is oriented to person, place, and time. He appears well-developed and well-nourished.  HENT:  Head: Normocephalic.  Eyes: EOM are normal. Pupils are equal, round, and reactive to light.  Neck: Normal range of motion.  Cardiovascular: Normal rate and regular rhythm.   Pulmonary/Chest: Effort normal.  Musculoskeletal:       Lower lumbar spine pain with  palpation particularly at the l4-l5 down to PSIS areas.  Pain worse with bending than with extension.  Pain with palpation over the right greater trochanter.  Diminiished sensation over the left anterior thigh. Positive SLR on right.  Can flex only to about 45 degrees. Cognitvely intact.  No focal weakness except for that associated with pain.   Neurological: He is oriented to person, place, and time.  Skin: Skin is warm.          Assessment & Plan:  1. 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 Meralgia paresthetica  -neurontin   3. 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 Leg length discrepancy  -continue shoe lift 5. F/u with RN in one month.

## 2011-12-02 ENCOUNTER — Telehealth: Payer: Self-pay | Admitting: Physical Medicine & Rehabilitation

## 2011-12-02 MED ORDER — OXYMORPHONE HCL ER 5 MG PO TB12: 10.0000 mg | ORAL_TABLET | Freq: Two times a day (BID) | ORAL | Status: DC

## 2011-12-02 NOTE — Telephone Encounter (Signed)
I printed this rx for you to sign in the morning. FYI.

## 2011-12-02 NOTE — Telephone Encounter (Signed)
Ok.  i refilled.  i assume these are printing out at the office when i do this remotely?!  They are not printing out at my house

## 2011-12-02 NOTE — Telephone Encounter (Signed)
Would you like pt to continue taking this medication? If so please print out rx and I will get you to sign this tomorrow morning. Thanks.

## 2011-12-02 NOTE — Telephone Encounter (Signed)
Appt yesterday.  Insurance will cover Opana ER.  Please write and he can p/u Friday.

## 2011-12-03 ENCOUNTER — Other Ambulatory Visit: Payer: Self-pay | Admitting: Orthopedic Surgery

## 2011-12-03 ENCOUNTER — Ambulatory Visit
Admission: RE | Admit: 2011-12-03 | Discharge: 2011-12-03 | Disposition: A | Discharge status: Home / Self Care | Payer: Medicare Other | Source: Ambulatory Visit | Attending: Orthopedic Surgery | Admitting: Orthopedic Surgery

## 2011-12-03 DIAGNOSIS — M5116 Intervertebral disc disorders with radiculopathy, lumbar region: Secondary | ICD-10-CM

## 2011-12-03 DIAGNOSIS — M545 Low back pain: Secondary | ICD-10-CM

## 2011-12-03 NOTE — Telephone Encounter (Signed)
Got it.

## 2011-12-03 NOTE — Telephone Encounter (Signed)
Pt aware rx is ready for pickup.  

## 2011-12-31 ENCOUNTER — Encounter: Payer: Self-pay | Admitting: Physical Medicine & Rehabilitation

## 2011-12-31 ENCOUNTER — Encounter: Payer: Medicare Other | Attending: Neurosurgery | Admitting: Physical Medicine & Rehabilitation

## 2011-12-31 VITALS — BP 141/68 | HR 77 | Resp 16 | Ht 71.0 in | Wt 197.4 lb

## 2011-12-31 DIAGNOSIS — M1610 Unilateral primary osteoarthritis, unspecified hip: Secondary | ICD-10-CM | POA: Insufficient documentation

## 2011-12-31 DIAGNOSIS — M7060 Trochanteric bursitis, unspecified hip: Secondary | ICD-10-CM

## 2011-12-31 DIAGNOSIS — M5116 Intervertebral disc disorders with radiculopathy, lumbar region: Secondary | ICD-10-CM

## 2011-12-31 DIAGNOSIS — M76899 Other specified enthesopathies of unspecified lower limb, excluding foot: Secondary | ICD-10-CM | POA: Insufficient documentation

## 2011-12-31 DIAGNOSIS — M5126 Other intervertebral disc displacement, lumbar region: Secondary | ICD-10-CM | POA: Insufficient documentation

## 2011-12-31 DIAGNOSIS — M2170 Unequal limb length (acquired), unspecified site: Secondary | ICD-10-CM

## 2011-12-31 MED ORDER — OXYCODONE HCL 10 MG PO TABS: 10.0000 mg | ORAL_TABLET | Freq: Four times a day (QID) | ORAL | Status: DC | PRN

## 2011-12-31 MED ORDER — OXYMORPHONE HCL ER 10 MG PO TB12: 10.0000 mg | ORAL_TABLET | Freq: Two times a day (BID) | ORAL | Status: AC

## 2011-12-31 NOTE — Progress Notes (Signed)
Subjective:    Patient ID: Thomas [name], male    DOB: October 18, 1940, 71 y.o.   MRN: [id number]  HPI Thomas [name] is back regarding his chronic back and leg pain.  He had the ESI at the beginning of March which helped the back and leg pain but the outer/anterior thigh is still bothering him. He tries to walk on his treadmill but he can only stand a few minutes before the hip starts to really bother him. He does feel in general that the leg will loosen up as the day goes along.   He is scheduled for another ESI next month apparently.  He finally got his opana filled. He is on the 5mg  dose currently. He is using oxycodone for breakthough pain.  Neurontin is on board for neuropathic pain.  Pain Inventory Average Pain 4 Pain Right Now 5 My pain is intermittent, dull and stabbing  In the last 24 hours, has pain interfered with the following? General activity 6 Relation with others 3 Enjoyment of life 5 What TIME of day is your pain at its worst? morning Sleep (in general) Fair  Pain is worse with: walking and standing Pain improves with: heat/ice and medication Relief from Meds: 7  Mobility walk without assistance how many minutes can you walk? 15 ability to climb steps?  yes do you drive?  yes Do you have any goals in this area?  yes  Function retired Do you have any goals in this area?  yes  Neuro/Psych No problems in this area  Prior Studies Any changes since last visit?  no  Physicians involved in your care Any changes since last visit?  no Primary care Dr Clovis Riley @[facility] Orth[facility]ist Dr Dante Gang Dr [facility]      Review of Systems  Constitutional: Positive for diaphoresis.       Night sweats  Musculoskeletal:       Hip and leg pain after hip replacement  All other systems reviewed and are negative.       Objective:   Physical Exam  Nursing note and vitals reviewed. Constitutional: He is oriented to person, place, and time. He appears  well-developed and well-nourished.  HENT:  Head: Normocephalic and atraumatic.  Eyes: Conjunctivae and EOM are normal. Pupils are equal, round, and reactive to light.  Neck: Normal range of motion. Neck supple.  Cardiovascular: Normal rate and regular rhythm.   Pulmonary/Chest: Effort normal and breath sounds normal.  Abdominal: Soft.  Musculoskeletal: Normal range of motion.       Generalized lower lumbar spine pain with palpation and lumbar flexion.  Minimal pain today along right greater trochanter.  He continues to have pain with palpation over antero-lateral thigh. Pain is worse also with resisted abduction of hips.  Strength is essentially normal except for some pain inhibition at the proximal RLE.  He walks with an antalgic gait  Neurological: He is alert and oriented to person, place, and time. A sensory deficit (left anterior thigh) is present.  Psychiatric: He has a normal mood and affect. His speech is normal and behavior is normal. Judgment and thought content normal. Cognition and memory are normal.          Assessment & Plan:  ASSESSMENT:  1. 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  2. Meralgia paresthetica, left leg.  3. Lumbar radiculopathy. May have  contribution from trochanteric bursitis As well 4. Right leg length discrepancy.  PLAN:  1. Stay with Neurontin to 600 mg t.i.d.  2. oxycodone IR 10 mg 1 q.6 h. p.r.n. #120.  3. Opana ER increase to 10 q12 hours  #60 4..F/u ESI schedule per [facility] 5. I'll see him back next month

## 2011-12-31 NOTE — Patient Instructions (Signed)
Continue with current activity.  Try to increase walking distances as you tolerated

## 2012-01-26 ENCOUNTER — Telehealth: Payer: Self-pay | Admitting: Physical Medicine & Rehabilitation

## 2012-01-26 DIAGNOSIS — M5116 Intervertebral disc disorders with radiculopathy, lumbar region: Secondary | ICD-10-CM

## 2012-01-26 DIAGNOSIS — M1610 Unilateral primary osteoarthritis, unspecified hip: Secondary | ICD-10-CM

## 2012-01-26 MED ORDER — OXYCODONE HCL 10 MG PO TABS: 10.0000 mg | ORAL_TABLET | Freq: Four times a day (QID) | ORAL | Status: DC | PRN

## 2012-01-26 MED ORDER — OXYMORPHONE HCL ER 10 MG PO TB12: 10.0000 mg | ORAL_TABLET | Freq: Two times a day (BID) | ORAL | Status: AC

## 2012-01-26 NOTE — Telephone Encounter (Signed)
01/31/12 appointment was cx by office.  Will need refill on Opana and Oxycodone.

## 2012-01-26 NOTE — Telephone Encounter (Signed)
Rx ready for pick up, pt aware 

## 2012-01-26 NOTE — Telephone Encounter (Signed)
Rx are on Dr. Riley Kill cart to be signed.

## 2012-01-31 ENCOUNTER — Ambulatory Visit: Payer: Medicare Other | Admitting: Physical Medicine & Rehabilitation

## 2012-02-02 IMAGING — CT CT ABD-PELV W/ CM
2 of 6 series · 17 of 46 positions shown, 19 images · IV contrast (APPLIED)
Comparison: 09/14/2009

CLINICAL DATA: Abdominal pain, Clostridium difficile, nausea,
vomiting, diarrhea

CT ABDOMEN AND PELVIS WITH CONTRAST
TECHNIQUE: Multidetector CT imaging of the abdomen and pelvis was
performed following the standard protocol during bolus
administration of intravenous contrast.
Contrast: 100 ml Omniscan 300 IV contrast

[Series 2: abd/pelv with 5.0 b31f st · axial · 0.75mm/px · z∈[+771,+1196]mm · 14 of 97 slices shown, 16 images]
[im 6/97  soft-tissue]
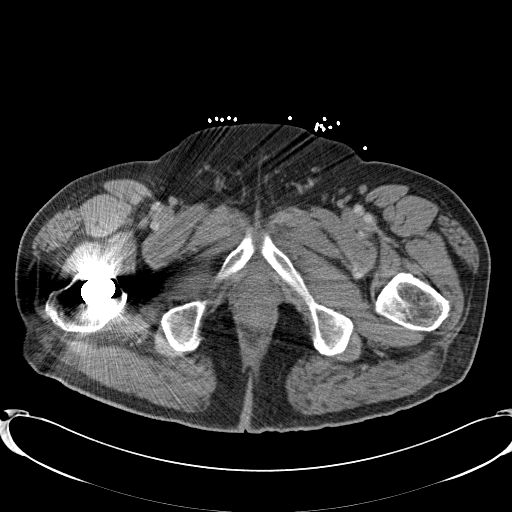
[im 6/97  bone]
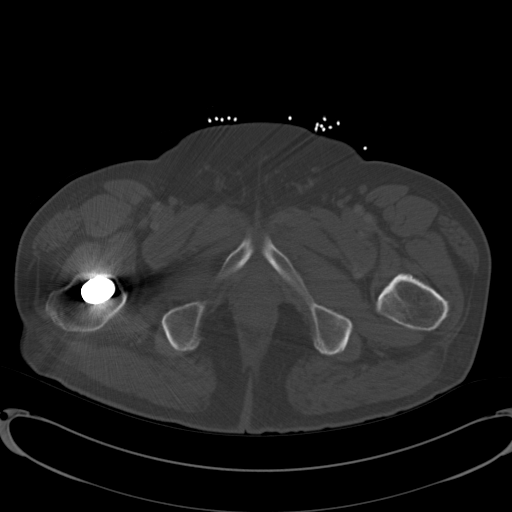
[im 11/97  soft-tissue]
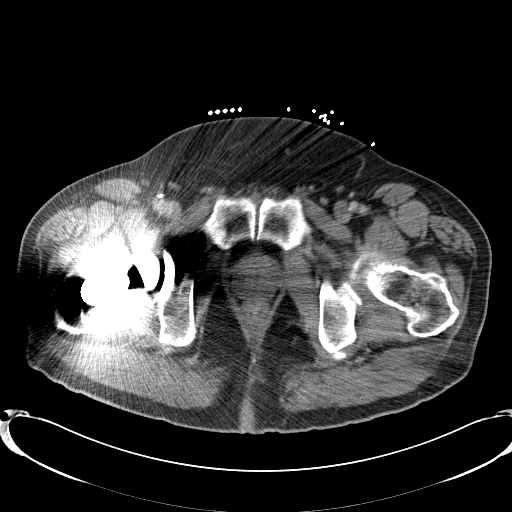
[im 22/97  soft-tissue]
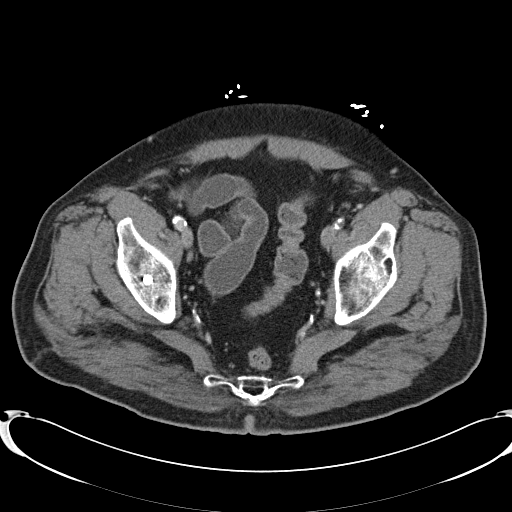
[im 27/97  soft-tissue]
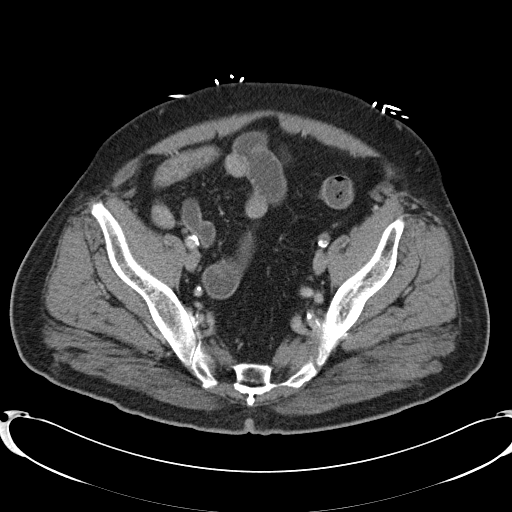
[im 33/97  soft-tissue]
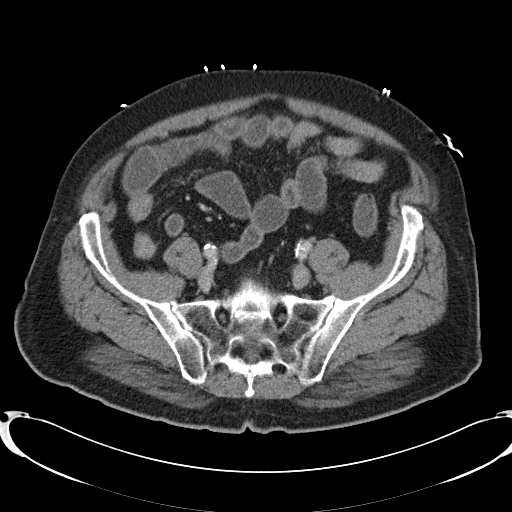
[im 38/97  soft-tissue]
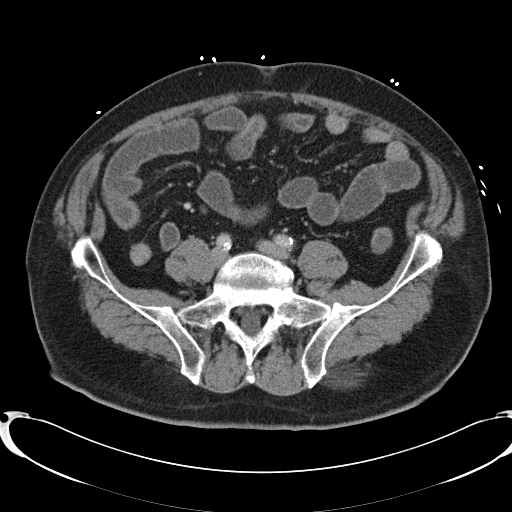
[im 43/97  soft-tissue]
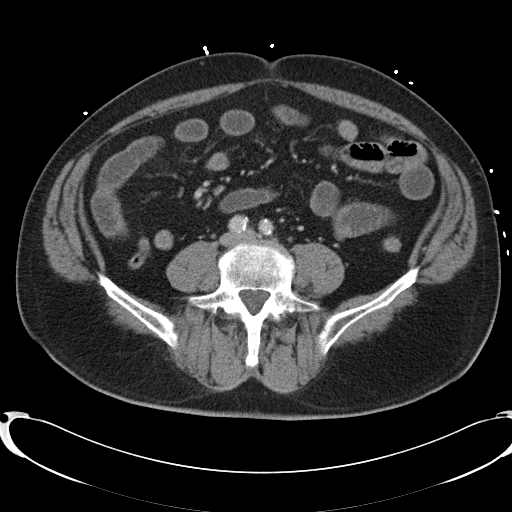
[im 54/97  soft-tissue]
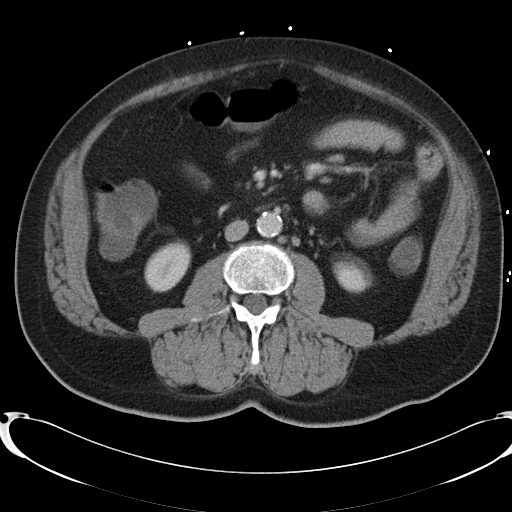
[im 59/97  soft-tissue]
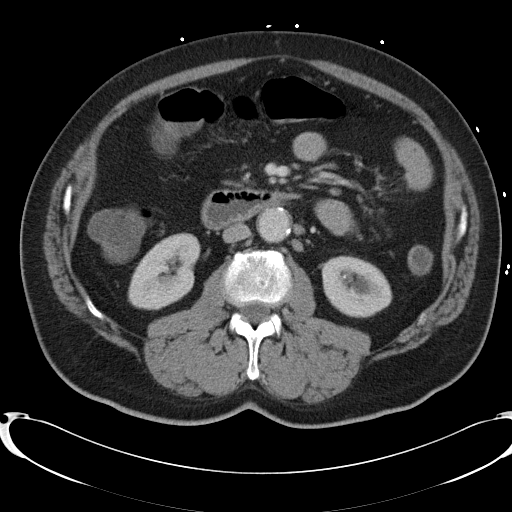
[im 59/97  bone]
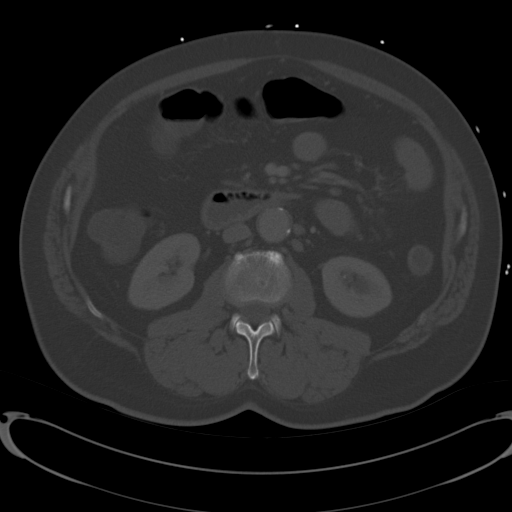
[im 65/97  soft-tissue]
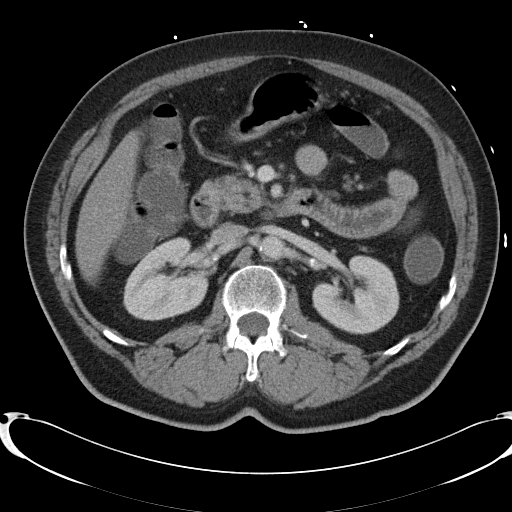
[im 70/97  soft-tissue]
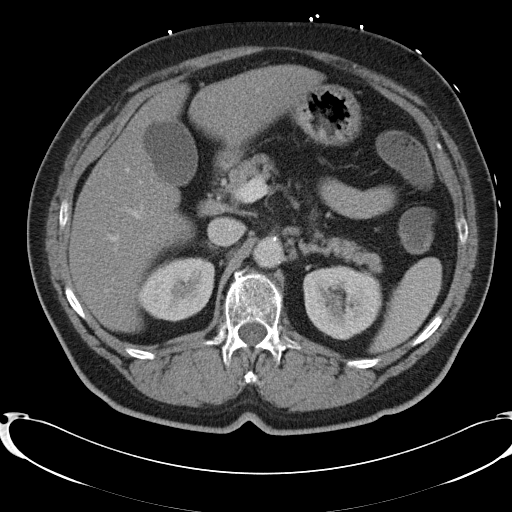
[im 75/97  soft-tissue]
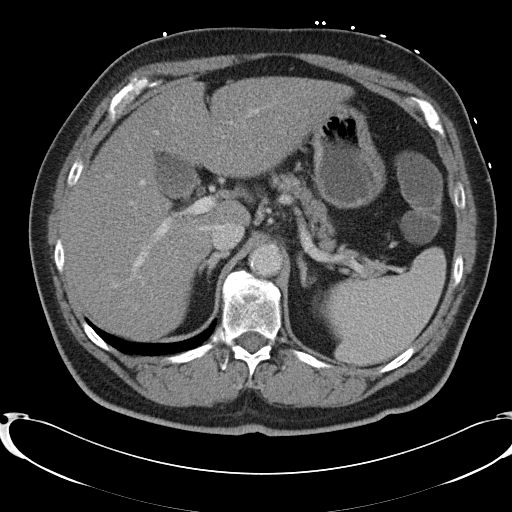
[im 86/97  soft-tissue]
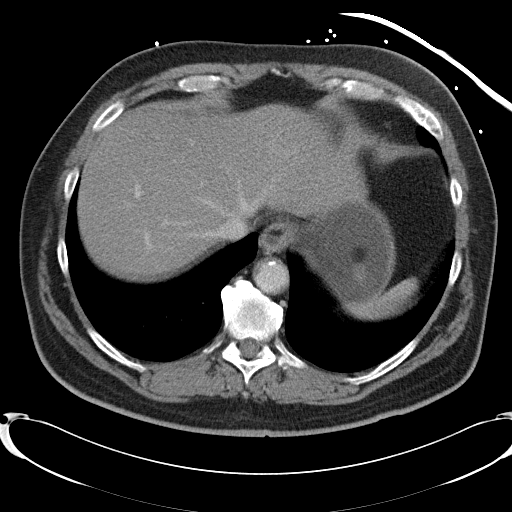
[im 91/97  soft-tissue]
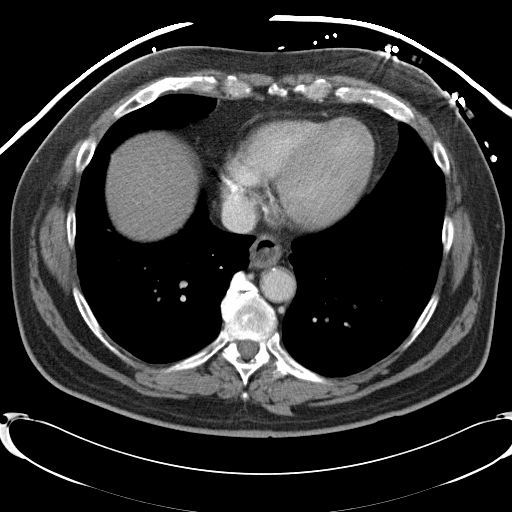

[Series 602: <mpr range> · coronal · 0.95mm/px · 3 of 126 slices shown]
[im 42/126  soft-tissue]
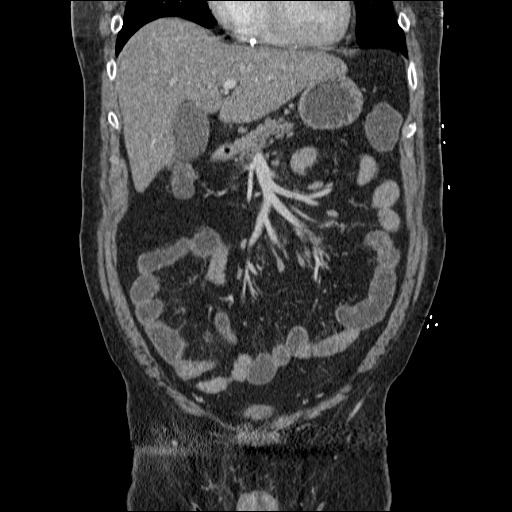
[im 56/126  soft-tissue]
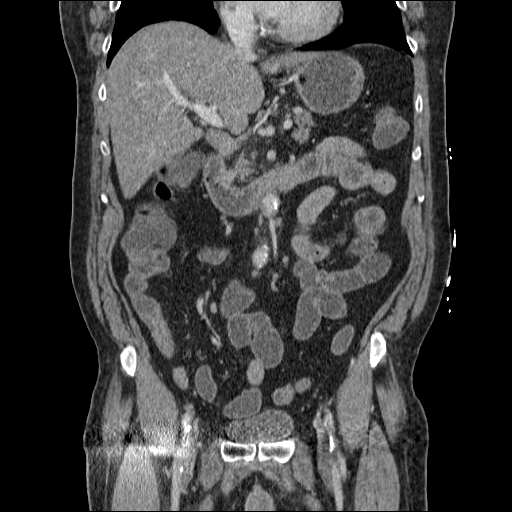
[im 70/126  soft-tissue]
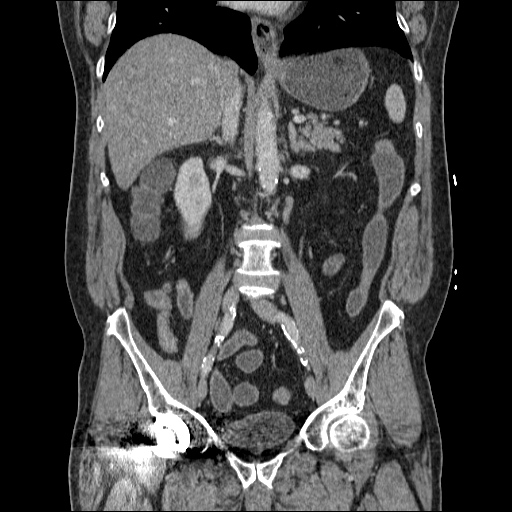

[17 of 46 positions shown; findings below may reference images not displayed]

FINDINGS: The lung bases are clear.  Fluid/debris level noted in
the distal esophagus with mild apparent esophageal wall thickening.

The liver is mildly fatty infiltrated.  No focal mass.
Gallbladder, adrenal glands, right kidney unremarkable.  Too small
to characterize 5 mm left mid renal cortical hypodensity
incidentally noted.  2 mm nonobstructing left lower renal pole
calculus.  Mild motion artifact degrades imaging through the mid
abdomen.  Spleen, pancreas are unremarkable.  Small mesenteric and
retroperitoneal lymph nodes are noted.  None meets CT criteria for
pathologic enlargement.

Streak artifact from right total hip arthroplasty noted.  Fluid is
noted in the nondilated colon.  Appendix and small bowel are
normal.  No bowel wall thickening or pericolonic stranding.  No
pelvic free fluid.  No acute bony abnormality.
IMPRESSION: No acute intra-abdominal or pelvic process.  Fluid within the colon
is compatible with a history of diarrhea.  No bowel wall thickening
or pericolonic stranding.

## 2012-02-29 ENCOUNTER — Encounter: Payer: Medicare Other | Attending: Neurosurgery | Admitting: Physical Medicine & Rehabilitation

## 2012-02-29 ENCOUNTER — Encounter: Payer: Self-pay | Admitting: Physical Medicine & Rehabilitation

## 2012-02-29 VITALS — BP 140/69 | HR 73 | Resp 16 | Ht 71.0 in | Wt 197.0 lb

## 2012-02-29 DIAGNOSIS — M7060 Trochanteric bursitis, unspecified hip: Secondary | ICD-10-CM

## 2012-02-29 DIAGNOSIS — M76899 Other specified enthesopathies of unspecified lower limb, excluding foot: Secondary | ICD-10-CM | POA: Insufficient documentation

## 2012-02-29 DIAGNOSIS — M2170 Unequal limb length (acquired), unspecified site: Secondary | ICD-10-CM | POA: Insufficient documentation

## 2012-02-29 DIAGNOSIS — M5417 Radiculopathy, lumbosacral region: Secondary | ICD-10-CM

## 2012-02-29 DIAGNOSIS — M1610 Unilateral primary osteoarthritis, unspecified hip: Secondary | ICD-10-CM | POA: Insufficient documentation

## 2012-02-29 DIAGNOSIS — G5710 Meralgia paresthetica, unspecified lower limb: Secondary | ICD-10-CM | POA: Insufficient documentation

## 2012-02-29 DIAGNOSIS — IMO0002 Reserved for concepts with insufficient information to code with codable children: Secondary | ICD-10-CM | POA: Insufficient documentation

## 2012-02-29 DIAGNOSIS — M5126 Other intervertebral disc displacement, lumbar region: Secondary | ICD-10-CM | POA: Insufficient documentation

## 2012-02-29 DIAGNOSIS — M5116 Intervertebral disc disorders with radiculopathy, lumbar region: Secondary | ICD-10-CM

## 2012-02-29 MED ORDER — OXYMORPHONE HCL ER 20 MG PO TB12: 20.0000 mg | ORAL_TABLET | Freq: Two times a day (BID) | ORAL | Status: DC

## 2012-02-29 MED ORDER — OXYCODONE HCL 10 MG PO TABS: 10.0000 mg | ORAL_TABLET | Freq: Four times a day (QID) | ORAL | Status: DC | PRN

## 2012-02-29 NOTE — Progress Notes (Signed)
  Subjective:    Patient ID: Thomas [name], male    DOB: [birth year], 71 y.o.   MRN: [id number]  HPI Thomas [name] is back regarding his right back and hip pain. He didn't have much benefit with the last ESI. In fact, his pain has increased over the last couple months. He hasn't had any tolerance issues with the opana but doesn't feel that it helped a great deal either.  No further intervention is planned by surgery currently. He now has to use a cane for balance and to offload the right leg.    Pain Inventory Average Pain 7 Pain Right Now 7 My pain is constant  In the last 24 hours, has pain interfered with the following? General activity 7 Relation with others 6 Enjoyment of life 7 What TIME of day is your pain at its worst? all of the time Sleep (in general) Fair  Pain is worse with: walking and sitting Pain improves with: rest and medication Relief from Meds: 5  Mobility walk without assistance use a cane how many minutes can you walk? 10 ability to climb steps?  yes do you drive?  yes  Function retired  Neuro/Psych trouble walking  Prior Studies Any changes since last visit?  no  Physicians involved in your care Any changes since last visit?  no     Review of Systems  Constitutional: Positive for diaphoresis.       Night sweats  Musculoskeletal: Positive for gait problem.  All other systems reviewed and are negative.       Objective:   Physical Exam  Constitutional: He is oriented to person, place, and time. He appears well-developed and well-nourished.  HENT:  Head: Normocephalic and atraumatic.  Nose: Nose normal.  Mouth/Throat: Oropharynx is clear and moist.  Eyes: EOM are normal. Pupils are equal, round, and reactive to light.  Neck: Normal range of motion.  Cardiovascular: Normal rate and regular rhythm.   Pulmonary/Chest: Effort normal and breath sounds normal.  Abdominal: Soft. He exhibits no distension. There is no tenderness.  Musculoskeletal:         Lumbar back: He exhibits decreased range of motion, tenderness, bony tenderness, pain and spasm.       He continues to have significant point tenderness over the greater trochanter on the right. He has tenderness to the distal third of the outer thigh as well. He walks with antalgia on that limb and needs a cane for balance.  Low back is tender to palpation over the psis and the right lower lumbar paraspinals.   Neurological: He is alert and oriented to person, place, and time.  Psychiatric: He has a normal mood and affect. His behavior is normal. Judgment and thought content normal.          Assessment & Plan:  ASSESSMENT:  1. 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 There aren't a lot of treatment options left for me quite frankly. 2. Meralgia paresthetica, left leg.  3. Lumbar radiculopathy. Has a  contribution from trochanteric bursitis As well.  I think the hip remains the biggest factor.  4. Right leg length discrepancy.   PLAN:  1. Stay with Neurontin to 600 mg t.i.d.  2. oxycodone IR 10 mg 1 q.6 h. p.r.n. #120.  3. Opana ER-- increase to 20 q12 hours #60  4..F/u with [facility]? 5. I'll see him back next month

## 2012-03-13 ENCOUNTER — Telehealth: Payer: Self-pay | Admitting: Physical Medicine & Rehabilitation

## 2012-03-13 NOTE — Telephone Encounter (Signed)
Where is the morphine coming from on his uds?

## 2012-03-31 ENCOUNTER — Ambulatory Visit: Payer: Medicare Other | Admitting: Physical Medicine & Rehabilitation

## 2012-03-31 ENCOUNTER — Encounter: Payer: Self-pay | Admitting: Physical Medicine and Rehabilitation

## 2012-03-31 ENCOUNTER — Encounter: Payer: Medicare Other | Attending: Physical Medicine & Rehabilitation | Admitting: Physical Medicine and Rehabilitation

## 2012-03-31 VITALS — BP 138/51 | HR 83 | Resp 16 | Wt 192.0 lb

## 2012-03-31 DIAGNOSIS — M5116 Intervertebral disc disorders with radiculopathy, lumbar region: Secondary | ICD-10-CM

## 2012-03-31 DIAGNOSIS — M545 Low back pain, unspecified: Secondary | ICD-10-CM

## 2012-03-31 DIAGNOSIS — G5710 Meralgia paresthetica, unspecified lower limb: Secondary | ICD-10-CM

## 2012-03-31 DIAGNOSIS — IMO0002 Reserved for concepts with insufficient information to code with codable children: Secondary | ICD-10-CM

## 2012-03-31 DIAGNOSIS — M25551 Pain in right hip: Secondary | ICD-10-CM

## 2012-03-31 DIAGNOSIS — Z96649 Presence of unspecified artificial hip joint: Secondary | ICD-10-CM | POA: Insufficient documentation

## 2012-03-31 DIAGNOSIS — M2170 Unequal limb length (acquired), unspecified site: Secondary | ICD-10-CM

## 2012-03-31 DIAGNOSIS — Y831 Surgical operation with implant of artificial internal device as the cause of abnormal reaction of the patient, or of later complication, without mention of misadventure at the time of the procedure: Secondary | ICD-10-CM | POA: Insufficient documentation

## 2012-03-31 DIAGNOSIS — M549 Dorsalgia, unspecified: Secondary | ICD-10-CM | POA: Insufficient documentation

## 2012-03-31 DIAGNOSIS — M25559 Pain in unspecified hip: Secondary | ICD-10-CM | POA: Insufficient documentation

## 2012-03-31 DIAGNOSIS — T84099A Other mechanical complication of unspecified internal joint prosthesis, initial encounter: Secondary | ICD-10-CM | POA: Insufficient documentation

## 2012-03-31 DIAGNOSIS — M1610 Unilateral primary osteoarthritis, unspecified hip: Secondary | ICD-10-CM

## 2012-03-31 DIAGNOSIS — G8929 Other chronic pain: Secondary | ICD-10-CM | POA: Insufficient documentation

## 2012-03-31 DIAGNOSIS — M5417 Radiculopathy, lumbosacral region: Secondary | ICD-10-CM

## 2012-03-31 DIAGNOSIS — M7060 Trochanteric bursitis, unspecified hip: Secondary | ICD-10-CM

## 2012-03-31 DIAGNOSIS — M5126 Other intervertebral disc displacement, lumbar region: Secondary | ICD-10-CM

## 2012-03-31 DIAGNOSIS — M76899 Other specified enthesopathies of unspecified lower limb, excluding foot: Secondary | ICD-10-CM

## 2012-03-31 MED ORDER — OXYCODONE HCL 10 MG PO TABS: 10.0000 mg | ORAL_TABLET | Freq: Four times a day (QID) | ORAL | Status: DC | PRN

## 2012-03-31 MED ORDER — OXYMORPHONE HCL ER 20 MG PO TB12: 20.0000 mg | ORAL_TABLET | Freq: Two times a day (BID) | ORAL | Status: DC

## 2012-03-31 NOTE — Progress Notes (Signed)
Subjective:    Patient ID: Thomas [name], male    DOB: 11/28/1940, 71 y.o.   MRN: [id number]  HPI Thomas [name] is back regarding his right back and hip pain. He states that his low back pain has improved after  the last ESI. His hip pain has been stable, until he ran out of his medication, because his appointment was pushed back and he did not have the prescription on time.   Pain Inventory Average Pain 6 Pain Right Now 5 My pain is sharp, stabbing and aching  In the last 24 hours, has pain interfered with the following? General activity 6 Relation with others 5 Enjoyment of life 7 What TIME of day is your pain at its worst? morning Sleep (in general) Poor  Pain is worse with: walking Pain improves with: medication Relief from Meds: 7  Mobility walk without assistance how many minutes can you walk? 10  Function retired  Neuro/Psych No problems in this area  Prior Studies Any changes since last visit?  no  Physicians involved in your care Any changes since last visit?  no   Family History  Problem Relation Age of Onset  . Emphysema Mother   . Cancer Father     Brain  . Diabetes Son    History   Social History  . Marital Status: Married    Spouse Name: N/A    Number of Children: N/A  . Years of Education: N/A   Social History Main Topics  . Smoking status: Former Smoker    Types: Cigarettes    Quit date: 10/11/2005  . Smokeless tobacco: Never Used  . Alcohol Use: Yes     occasionally  . Drug Use: No  . Sexually Active: None   Other Topics Concern  . None   Social History Narrative  . None   Past Surgical History  Procedure Date  . Shoulder surgery     right  . Carpal tunnel release 2006    right   . Hip fracture surgery 11/2007    right  . Joint replacement 2009    right total hip replacement  . Coronary angioplasty with stent placement    Past Medical History  Diagnosis Date  . Diabetes mellitus   . CAD (coronary artery disease)   .  Hypertension   . Leg pain   . Carotid artery occlusion   . Hyperlipidemia   . AAA (abdominal aortic aneurysm)   . 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 Chronic kidney disease     hx kidney stones   BP 138/51  Pulse 83  Resp 16  Wt 192 lb (87.091 kg)  SpO2 97%   Review of Systems  Constitutional: Positive for diaphoresis.  Musculoskeletal:       Hip pain  All other systems reviewed and are negative.       Objective:   Physical Exam  Constitutional: He is oriented to person, place, and time. He appears well-developed and well-nourished.  HENT:  Head: Normocephalic and atraumatic.  Nose: Nose normal.  Mouth/Throat: Oropharynx is clear and moist.  Eyes: EOM are normal. Pupils are equal, round, and reactive to light.  Neck: Normal range of motion.  Cardiovascular: Normal rate and regular rhythm.  Pulmonary/Chest: Effort normal and breath sounds normal.  Abdominal: Soft. He exhibits no distension. There is no tenderness.  Musculoskeletal:  Lumbar back: He exhibits decreased range of motion, tenderness, bony tenderness, pain and spasm.  He  continues to have significant point tenderness over the greater trochanter on the right. He has tenderness to the distal third of the outer thigh as well. He walks with antalgia on that limb.  Range of motion of right hip : Hip flexion 100, abduction 25 , extension deficit of 10  Right hip strength 5 out of 5  Neurological: He is alert and oriented to person, place, and time.  Psychiatric: He has a normal mood and affect. His behavior is normal. Judgment and thought content normal.        Assessment & Plan:  1. 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 There aren't a lot of treatment options left for me quite frankly.  2. Meralgia paresthetica, left leg.  3. 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 Right leg length discrepancy.  PLAN:  1. Stay with Neurontin to 600 mg t.i.d.  2.  oxycodone IR 10 mg 1 q.6 h. p.r.n. #120.  3. Opana ER-- increase to 20 q12 hours #60  4. Continue with your walking program, advised patient to walk in the pool of his [employer]. Followup in one month

## 2012-03-31 NOTE — Patient Instructions (Signed)
Continue with walking program, advised patient to walk in the pool .Contine with medication.

## 2012-05-01 ENCOUNTER — Encounter: Payer: Self-pay | Admitting: Physical Medicine and Rehabilitation

## 2012-05-01 ENCOUNTER — Encounter: Payer: Medicare Other | Attending: Physical Medicine & Rehabilitation | Admitting: Physical Medicine and Rehabilitation

## 2012-05-01 VITALS — BP 128/69 | HR 72 | Resp 16 | Ht 71.0 in | Wt 193.0 lb

## 2012-05-01 DIAGNOSIS — M2170 Unequal limb length (acquired), unspecified site: Secondary | ICD-10-CM | POA: Insufficient documentation

## 2012-05-01 DIAGNOSIS — M7060 Trochanteric bursitis, unspecified hip: Secondary | ICD-10-CM

## 2012-05-01 DIAGNOSIS — M5417 Radiculopathy, lumbosacral region: Secondary | ICD-10-CM

## 2012-05-01 DIAGNOSIS — M76899 Other specified enthesopathies of unspecified lower limb, excluding foot: Secondary | ICD-10-CM

## 2012-05-01 DIAGNOSIS — IMO0002 Reserved for concepts with insufficient information to code with codable children: Secondary | ICD-10-CM

## 2012-05-01 DIAGNOSIS — G5710 Meralgia paresthetica, unspecified lower limb: Secondary | ICD-10-CM | POA: Insufficient documentation

## 2012-05-01 DIAGNOSIS — M5116 Intervertebral disc disorders with radiculopathy, lumbar region: Secondary | ICD-10-CM

## 2012-05-01 DIAGNOSIS — T8489XA Other specified complication of internal orthopedic prosthetic devices, implants and grafts, initial encounter: Secondary | ICD-10-CM | POA: Insufficient documentation

## 2012-05-01 DIAGNOSIS — M5126 Other intervertebral disc displacement, lumbar region: Secondary | ICD-10-CM

## 2012-05-01 DIAGNOSIS — M25559 Pain in unspecified hip: Secondary | ICD-10-CM | POA: Insufficient documentation

## 2012-05-01 DIAGNOSIS — M1610 Unilateral primary osteoarthritis, unspecified hip: Secondary | ICD-10-CM

## 2012-05-01 DIAGNOSIS — M25551 Pain in right hip: Secondary | ICD-10-CM

## 2012-05-01 DIAGNOSIS — Y831 Surgical operation with implant of artificial internal device as the cause of abnormal reaction of the patient, or of later complication, without mention of misadventure at the time of the procedure: Secondary | ICD-10-CM | POA: Insufficient documentation

## 2012-05-01 MED ORDER — OXYCODONE HCL 10 MG PO TABS: 10.0000 mg | ORAL_TABLET | Freq: Four times a day (QID) | ORAL | Status: DC | PRN

## 2012-05-01 MED ORDER — OXYMORPHONE HCL ER 20 MG PO TB12: 20.0000 mg | ORAL_TABLET | Freq: Two times a day (BID) | ORAL | Status: DC

## 2012-05-01 NOTE — Patient Instructions (Signed)
Continue with exercising, continue with going into the hot tub, continue walking as much as tolerated

## 2012-05-01 NOTE — Progress Notes (Signed)
Subjective:    Patient ID: Thomas [name], male    DOB: 06/04/1941, 71 y.o.   MRN: [id number]  HPI The patient complains about chronic right hip pain, s/p arthroplasty.  The problem has been stable. Pain Inventory Average Pain 6 Pain Right Now 5 My pain is constant and aching  In the last 24 hours, has pain interfered with the following? General activity 5 Relation with others 5 Enjoyment of life 6 What TIME of day is your pain at its worst? morning Sleep (in general) Fair  Pain is worse with: walking Pain improves with: rest and medication Relief from Meds: 6  Mobility walk without assistance how many minutes can you walk? 15 ability to climb steps?  yes do you drive?  yes  Function retired  Neuro/Psych No problems in this area  Prior Studies Any changes since last visit?  no  Physicians involved in your care Any changes since last visit?  no   Family History  Problem Relation Age of Onset  . Emphysema Mother   . Cancer Father     Brain  . Diabetes Son    History   Social History  . Marital Status: Married    Spouse Name: N/A    Number of Children: N/A  . Years of Education: N/A   Social History Main Topics  . Smoking status: Former Smoker    Types: Cigarettes    Quit date: 10/11/2005  . Smokeless tobacco: Never Used  . Alcohol Use: Yes     occasionally  . Drug Use: No  . Sexually Active: None   Other Topics Concern  . None   Social History Narrative  . None   Past Surgical History  Procedure Date  . Shoulder surgery     right  . Carpal tunnel release 2006    right   . Hip fracture surgery 11/2007    right  . Joint replacement 2009    right total hip replacement  . Coronary angioplasty with stent placement    Past Medical History  Diagnosis Date  . Diabetes mellitus   . CAD (coronary artery disease)   . Hypertension   . Leg pain   . Carotid artery occlusion   . Hyperlipidemia   . AAA (abdominal aortic aneurysm)   .  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 Chronic kidney disease     hx kidney stones   BP 128/69  Pulse 72  Resp 16  Ht 5\' 11"  (1.803 m)  Wt 193 lb (87.544 kg)  BMI 26.92 kg/m2  SpO2 99%      Review of Systems  Constitutional: Positive for diaphoresis.  HENT: Negative.   Eyes: Negative.   Respiratory: Negative.   Cardiovascular: Negative.   Gastrointestinal: Negative.   Genitourinary: Negative.   Musculoskeletal: Negative.   Skin: Negative.   Neurological: Negative.   Hematological: Negative.   Psychiatric/Behavioral: Negative.        Objective:   Physical Exam Constitutional: He is oriented to person, place, and time. He appears well-developed and well-nourished.  HENT:  Head: Normocephalic and atraumatic.  Nose: Nose normal.  Mouth/Throat: Oropharynx is clear and moist.  Eyes: EOM are normal. Pupils are equal, round, and reactive to light.  Neck: Normal range of motion.  Cardiovascular: Normal rate and regular rhythm.  Pulmonary/Chest: Effort normal and breath sounds normal.  Abdominal: Soft. He exhibits no distension. There is no tenderness.  Musculoskeletal:  Lumbar back: He exhibits  decreased range of motion, tenderness, bony tenderness, pain and spasm.  He continues to have significant point tenderness over the greater trochanter on the right. He has tenderness to the distal third of the outer thigh as well. He walks with antalgia on that limb.  Range of motion of right hip : Hip flexion 100, abduction 25 , extension deficit of 10  Right hip strength 5 out of 5  Neurological: He is alert and oriented to person, place, and time.  Psychiatric: He has a normal mood and affect. His behavior is normal. Judgment and thought content normal.         Assessment & Plan:  1. 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 There aren't a lot of treatment options left for me quite frankly.  2. Meralgia paresthetica, left leg.  3.  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 Right leg length discrepancy.  PLAN:  1. Stay with Neurontin to 600 mg t.i.d.  2. oxycodone IR 10 mg 1 q.6 h. p.r.n. #120.  3. Opana ER-- increase to 20 q12 hours #60  4. Continue with your walking program, advised patient to walk in the pool of his [employer].  Followup in one month

## 2012-05-24 ENCOUNTER — Ambulatory Visit: Payer: Medicare Other | Admitting: Physical Medicine and Rehabilitation

## 2012-05-31 ENCOUNTER — Encounter: Payer: Medicare Other | Attending: Neurosurgery | Admitting: Physical Medicine & Rehabilitation

## 2012-05-31 ENCOUNTER — Encounter: Payer: Self-pay | Admitting: Physical Medicine & Rehabilitation

## 2012-05-31 VITALS — BP 157/77 | HR 78 | Resp 18 | Ht 71.0 in | Wt 194.0 lb

## 2012-05-31 DIAGNOSIS — IMO0002 Reserved for concepts with insufficient information to code with codable children: Secondary | ICD-10-CM

## 2012-05-31 DIAGNOSIS — M1610 Unilateral primary osteoarthritis, unspecified hip: Secondary | ICD-10-CM

## 2012-05-31 DIAGNOSIS — M5126 Other intervertebral disc displacement, lumbar region: Secondary | ICD-10-CM

## 2012-05-31 DIAGNOSIS — G5710 Meralgia paresthetica, unspecified lower limb: Secondary | ICD-10-CM

## 2012-05-31 DIAGNOSIS — M5116 Intervertebral disc disorders with radiculopathy, lumbar region: Secondary | ICD-10-CM

## 2012-05-31 DIAGNOSIS — M76899 Other specified enthesopathies of unspecified lower limb, excluding foot: Secondary | ICD-10-CM

## 2012-05-31 DIAGNOSIS — M7060 Trochanteric bursitis, unspecified hip: Secondary | ICD-10-CM

## 2012-05-31 DIAGNOSIS — M2170 Unequal limb length (acquired), unspecified site: Secondary | ICD-10-CM

## 2012-05-31 DIAGNOSIS — M5417 Radiculopathy, lumbosacral region: Secondary | ICD-10-CM

## 2012-05-31 MED ORDER — OXYMORPHONE HCL ER 20 MG PO TB12: 20.0000 mg | ORAL_TABLET | Freq: Three times a day (TID) | ORAL | Status: DC

## 2012-05-31 MED ORDER — OXYCODONE HCL 10 MG PO TABS: 10.0000 mg | ORAL_TABLET | Freq: Four times a day (QID) | ORAL | Status: DC | PRN

## 2012-05-31 NOTE — Progress Notes (Signed)
Subjective:   Pain Inventory Thomas [name] is back regarding his chronic back and leg pain. Unfortunately he was in the hospital with c diff about a month ago (apparently he's a chronic carrier).  The opana er has helped. His leg pain is much more tolerable.  He generally takes it at 8am but notices that the pain begins to recur around 1pm. He usually will take an oxy ir at that time and another one before dinner.  He takes the second opana in the early evening and then 2 oxy ir's at bed.  No new recommendations have been made by ortho.  Average Pain 6 Pain Right Now 5 My pain is constant, sharp and aching  In the last 24 hours, has pain interfered with the following? General activity 6 Relation with others 5 Enjoyment of life 6 What TIME of day is your pain at its worst? morning Sleep (in general) Fair  Pain is worse with: walking Pain improves with: rest and medication Relief from Meds: 6  Mobility walk without assistance how many minutes can you walk? 15 ability to climb steps?  yes do you drive?  yes Do you have any goals in this area?  no  Function retired Do you have any goals in this area?  no  Neuro/Psych No problems in this area  Prior Studies Any changes since last visit?  no  Physicians involved in your care Any changes since last visit?  no   Family History  Problem Relation Age of Onset  . Emphysema Mother   . Cancer Father     Brain  . Diabetes Son    History   Social History  . Marital Status: Married    Spouse Name: N/A    Number of Children: N/A  . Years of Education: N/A   Social History Main Topics  . Smoking status: Former Smoker    Types: Cigarettes    Quit date: 10/11/2005  . Smokeless tobacco: Never Used  . Alcohol Use: Yes     occasionally  . Drug Use: No  . Sexually Active: None   Other Topics Concern  . None   Social History Narrative  . None   Past Surgical History  Procedure Date  . Shoulder surgery     right  .  Carpal tunnel release 2006    right   . Hip fracture surgery 11/2007    right  . Joint replacement 2009    right total hip replacement  . Coronary angioplasty with stent placement    Past Medical History  Diagnosis Date  . Diabetes mellitus   . CAD (coronary artery disease)   . Hypertension   . Leg pain   . Carotid artery occlusion   . Hyperlipidemia   . AAA (abdominal aortic aneurysm)   . 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 Chronic kidney disease     hx kidney stones   BP 157/77  Pulse 78  Resp 18  Ht 5\' 11"  (1.803 m)  Wt 194 lb (87.998 kg)  BMI 27.06 kg/m2  SpO2 98%    Patient ID: Thomas [name], male    DOB: 03-22-1941, 71 y.o.   MRN: [id number]  HPI    Review of Systems  Constitutional: Negative.   HENT: Negative.   Eyes: Negative.   Respiratory: Negative.   Cardiovascular: Negative.   Gastrointestinal: Negative.   Genitourinary: Negative.   Musculoskeletal: Positive for arthralgias and gait problem.  Skin: Negative.  Neurological: Negative.   Hematological: Negative.   Psychiatric/Behavioral: Negative.        Objective:   Physical Exam Constitutional: He is oriented to person, place, and time. He appears well-developed and well-nourished.  HENT:  Head: Normocephalic and atraumatic.  Nose: Nose normal.  Mouth/Throat: Oropharynx is clear and moist.  Eyes: EOM are normal. Pupils are equal, round, and reactive to light.  Neck: Normal range of motion.  Cardiovascular: Normal rate and regular rhythm.  Pulmonary/Chest: Effort normal and breath sounds normal.  Abdominal: Soft. He exhibits no distension. There is no tenderness.  Musculoskeletal:  Lumbar back: He exhibits decreased range of motion, tenderness, bony tenderness, pain and spasm.  He continues to have significant point tenderness over the greater trochanter on the right. He has tenderness to the distal third of the outer thigh as well. He walks with antalgia on  that limb and needs a cane for balance. Low back is tender to palpation over the psis and the right lower lumbar paraspinals.  Neurological: He is alert and oriented to person, place, and time.  Psychiatric: He has a normal mood and affect. His behavior is normal. Judgment and thought content normal.    Assessment & Plan:   ASSESSMENT:  1. 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  The opana has helped this pain.  2. Meralgia paresthetica, left leg.  3. Lumbar radiculopathy. Has a  contribution from his right trochanteric bursitis as well 4. Right leg length discrepancy.  PLAN:  1. Stay with Neurontin to 600 mg t.i.d.  2. oxycodone IR 10 mg 1 q.6 h. p.r.n. #90--encouraged him to only use AS NEEDED. 3. Opana ER-- increase to 20 q 8 hours #90 to improve day time efficacy with the decrease in oxy IR as mentioned above  4..F/u with [facility] as indicated. Reviewed multiple exercises to stretch his hip. A packet was provided. 5. My PA will see him back next month.

## 2012-05-31 NOTE — Patient Instructions (Signed)

## 2012-06-01 ENCOUNTER — Telehealth: Payer: Self-pay | Admitting: Physical Medicine & Rehabilitation

## 2012-06-01 NOTE — Telephone Encounter (Signed)
Pt needs prior authorization. This will be started today.

## 2012-06-01 NOTE — Telephone Encounter (Signed)
Rx is for increase in  Opana.  Pharmacy, insurance will not fill because it is change in Rx.  Please call pharmacy

## 2012-06-05 ENCOUNTER — Telehealth: Payer: Self-pay | Admitting: Physical Medicine & Rehabilitation

## 2012-06-05 NOTE — Telephone Encounter (Signed)
[employer] has denied the pt increase in opana.  He is only allowed 60 in 30 days.  What would you like to do instead?

## 2012-06-05 NOTE — Telephone Encounter (Signed)
Opana #90 denied.  Allowed plan # 60.

## 2012-06-05 NOTE — Telephone Encounter (Signed)
Spoke with patient, needs a call back today in re: to his prior auth on his meds

## 2012-06-05 NOTE — Telephone Encounter (Signed)
Please call about denial from [facility].  Please help.

## 2012-06-06 ENCOUNTER — Telehealth: Payer: Self-pay | Admitting: Physical Medicine & Rehabilitation

## 2012-06-06 MED ORDER — OXYMORPHONE HCL ER 30 MG PO TB12: 30.0000 mg | ORAL_TABLET | Freq: Two times a day (BID) | ORAL | Status: DC

## 2012-06-06 NOTE — Telephone Encounter (Signed)
Lm advising pt that we have called the pharmacy and insurance company.  His request was denied by [employer].  I can be appealed.  Request sent to Riley Kill to see if there is something else he can take.

## 2012-06-06 NOTE — Telephone Encounter (Signed)
Did you find out anything on his meds?

## 2012-06-06 NOTE — Telephone Encounter (Signed)
Then we can increase to 30mg  q12

## 2012-06-07 ENCOUNTER — Other Ambulatory Visit: Payer: Self-pay | Admitting: *Deleted

## 2012-06-07 ENCOUNTER — Telehealth: Payer: Self-pay

## 2012-06-07 ENCOUNTER — Telehealth: Payer: Self-pay | Admitting: *Deleted

## 2012-06-07 DIAGNOSIS — M5116 Intervertebral disc disorders with radiculopathy, lumbar region: Secondary | ICD-10-CM

## 2012-06-07 DIAGNOSIS — M2170 Unequal limb length (acquired), unspecified site: Secondary | ICD-10-CM

## 2012-06-07 DIAGNOSIS — M7060 Trochanteric bursitis, unspecified hip: Secondary | ICD-10-CM

## 2012-06-07 DIAGNOSIS — G5710 Meralgia paresthetica, unspecified lower limb: Secondary | ICD-10-CM

## 2012-06-07 DIAGNOSIS — M1610 Unilateral primary osteoarthritis, unspecified hip: Secondary | ICD-10-CM

## 2012-06-07 DIAGNOSIS — M5417 Radiculopathy, lumbosacral region: Secondary | ICD-10-CM

## 2012-06-07 MED ORDER — OXYCODONE HCL 10 MG PO TABS: 10.0000 mg | ORAL_TABLET | Freq: Four times a day (QID) | ORAL | Status: DC | PRN

## 2012-06-07 NOTE — Telephone Encounter (Signed)
Advised patient to go to [facility] to get script filled.  According to him and [facility] pharmacy they would not fill script because it said opana on it.  According to [place] guidelines its not a problem to fill the way it is written.  Patient will get filled at [facility].

## 2012-06-07 NOTE — Telephone Encounter (Signed)
Wants a call about the prescriptions he picked up today. He can't find any pharmacy to fill his rx because of the way we wrote it.

## 2012-06-07 NOTE — Telephone Encounter (Signed)
Pt aware that we have a new rx for Opana for him to pick up.

## 2012-06-07 NOTE — Telephone Encounter (Signed)
Pt called again regarding script.  This has been taken care of.

## 2012-06-08 ENCOUNTER — Ambulatory Visit: Payer: Medicare Other | Admitting: Physical Medicine & Rehabilitation

## 2012-06-08 ENCOUNTER — Telehealth: Payer: Self-pay | Admitting: *Deleted

## 2012-06-08 NOTE — Telephone Encounter (Signed)
Pt called again having issues with getting Opana ER filled. He is at [facility] who is going to fill an older version of Oxymorphone for him but all they have is #40.   [facility] and verified that is was fine to fill with the older version of Oxymorphone and #40.

## 2012-06-26 ENCOUNTER — Telehealth: Payer: Self-pay

## 2012-06-26 NOTE — Telephone Encounter (Signed)
Oxymorphone prior auth denied.

## 2012-06-28 ENCOUNTER — Telehealth: Payer: Self-pay

## 2012-06-28 MED ORDER — MORPHINE SULFATE ER 30 MG PO TBCR: 30.0000 mg | EXTENDED_RELEASE_TABLET | Freq: Two times a day (BID) | ORAL | Status: DC

## 2012-06-28 NOTE — Telephone Encounter (Signed)
Patients oxymorphone has been denied is there something else we can give him.

## 2012-06-28 NOTE — Telephone Encounter (Signed)
Script printed for Riley Kill to sign.  Patient will be contacted of change once it is signed.

## 2012-06-28 NOTE — Telephone Encounter (Signed)
We can try ms contin 30mg  q12

## 2012-06-30 ENCOUNTER — Encounter: Payer: Self-pay | Admitting: Physical Medicine and Rehabilitation

## 2012-06-30 ENCOUNTER — Encounter
Payer: Medicare Other | Attending: Physical Medicine and Rehabilitation | Admitting: Physical Medicine and Rehabilitation

## 2012-06-30 VITALS — BP 131/72 | HR 76 | Resp 14 | Ht 71.0 in | Wt 194.0 lb

## 2012-06-30 DIAGNOSIS — T84099A Other mechanical complication of unspecified internal joint prosthesis, initial encounter: Secondary | ICD-10-CM | POA: Insufficient documentation

## 2012-06-30 DIAGNOSIS — IMO0002 Reserved for concepts with insufficient information to code with codable children: Secondary | ICD-10-CM

## 2012-06-30 DIAGNOSIS — M5126 Other intervertebral disc displacement, lumbar region: Secondary | ICD-10-CM

## 2012-06-30 DIAGNOSIS — E119 Type 2 diabetes mellitus without complications: Secondary | ICD-10-CM | POA: Insufficient documentation

## 2012-06-30 DIAGNOSIS — M5116 Intervertebral disc disorders with radiculopathy, lumbar region: Secondary | ICD-10-CM

## 2012-06-30 DIAGNOSIS — G8929 Other chronic pain: Secondary | ICD-10-CM | POA: Insufficient documentation

## 2012-06-30 DIAGNOSIS — M2170 Unequal limb length (acquired), unspecified site: Secondary | ICD-10-CM

## 2012-06-30 DIAGNOSIS — M5417 Radiculopathy, lumbosacral region: Secondary | ICD-10-CM

## 2012-06-30 DIAGNOSIS — M7060 Trochanteric bursitis, unspecified hip: Secondary | ICD-10-CM

## 2012-06-30 DIAGNOSIS — I2510 Atherosclerotic heart disease of native coronary artery without angina pectoris: Secondary | ICD-10-CM | POA: Insufficient documentation

## 2012-06-30 DIAGNOSIS — M25559 Pain in unspecified hip: Secondary | ICD-10-CM | POA: Insufficient documentation

## 2012-06-30 DIAGNOSIS — M25551 Pain in right hip: Secondary | ICD-10-CM

## 2012-06-30 DIAGNOSIS — M76899 Other specified enthesopathies of unspecified lower limb, excluding foot: Secondary | ICD-10-CM

## 2012-06-30 DIAGNOSIS — M1610 Unilateral primary osteoarthritis, unspecified hip: Secondary | ICD-10-CM

## 2012-06-30 DIAGNOSIS — E785 Hyperlipidemia, unspecified: Secondary | ICD-10-CM | POA: Insufficient documentation

## 2012-06-30 DIAGNOSIS — Z96649 Presence of unspecified artificial hip joint: Secondary | ICD-10-CM | POA: Insufficient documentation

## 2012-06-30 DIAGNOSIS — I10 Essential (primary) hypertension: Secondary | ICD-10-CM | POA: Insufficient documentation

## 2012-06-30 DIAGNOSIS — Y831 Surgical operation with implant of artificial internal device as the cause of abnormal reaction of the patient, or of later complication, without mention of misadventure at the time of the procedure: Secondary | ICD-10-CM | POA: Insufficient documentation

## 2012-06-30 DIAGNOSIS — G5710 Meralgia paresthetica, unspecified lower limb: Secondary | ICD-10-CM

## 2012-06-30 MED ORDER — OXYCODONE HCL 10 MG PO TABS: 10.0000 mg | ORAL_TABLET | Freq: Four times a day (QID) | ORAL | Status: DC | PRN

## 2012-06-30 NOTE — Telephone Encounter (Signed)
Patient seen in the office today and this has been taken care of.

## 2012-06-30 NOTE — Patient Instructions (Signed)
Continue walking and exercising in the pool.

## 2012-06-30 NOTE — Progress Notes (Signed)
Subjective:    Patient ID: Thomas [name], male    DOB: [birth year], 71 y.o.   MRN: [id number]  HPI The patient complains about chronic right hip pain, s/p arthroplasty.  The problem has been stable. He reports that he walks and exercises regulary in a pool, which really gives him relief. He also reports, that his insurance does not pay for his oxymorphone, if there is any mention of opana on the prescription, he states, that he had a lot of problems to get his medication filled after his last visit.  Pain Inventory Average Pain 7 Pain Right Now 7 My pain is constant, sharp and aching  In the last 24 hours, has pain interfered with the following? General activity 6 Relation with others 6 Enjoyment of life 6 What TIME of day is your pain at its worst? morning and night Sleep (in general) Fair  Pain is worse with: some activites Pain improves with: pacing activities Relief from Meds: 8  Mobility walk without assistance how many minutes can you walk? 15 ability to climb steps?  yes do you drive?  yes Do you have any goals in this area?  no  Function retired Do you have any goals in this area?  no  Neuro/Psych No problems in this area  Prior Studies Any changes since last visit?  no  Physicians involved in your care Any changes since last visit?  no   Family History  Problem Relation Age of Onset  . Emphysema Mother   . Cancer Father     Brain  . Diabetes Son    History   Social History  . Marital Status: Married    Spouse Name: N/A    Number of Children: N/A  . Years of Education: N/A   Social History Main Topics  . Smoking status: Former Smoker    Types: Cigarettes    Quit date: 10/11/2005  . Smokeless tobacco: Never Used  . Alcohol Use: Yes     occasionally  . Drug Use: No  . Sexually Active: None   Other Topics Concern  . None   Social History Narrative  . None   Past Surgical History  Procedure Date  . Shoulder surgery     right  .  Carpal tunnel release 2006    right   . Hip fracture surgery 11/2007    right  . Joint replacement 2009    right total hip replacement  . Coronary angioplasty with stent placement    Past Medical History  Diagnosis Date  . Diabetes mellitus   . CAD (coronary artery disease)   . Hypertension   . Leg pain   . Carotid artery occlusion   . Hyperlipidemia   . AAA (abdominal aortic aneurysm)   . 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 Chronic kidney disease     hx kidney stones   BP 131/72  Pulse 76  Resp 14  Ht 5\' 11"  (1.803 m)  Wt 194 lb (87.998 kg)  BMI 27.06 kg/m2  SpO2 96%     Review of Systems  Constitutional: Positive for diaphoresis.  Musculoskeletal: Positive for myalgias and arthralgias.  All other systems reviewed and are negative.       Objective:   Physical Exam Constitutional: He is oriented to person, place, and time. He appears well-developed and well-nourished.  HENT:  Head: Normocephalic and atraumatic.  Nose: Nose normal.  Mouth/Throat: Oropharynx is clear and moist.  Eyes:  EOM are normal. Pupils are equal, round, and reactive to light.  Neck: Normal range of motion.  Cardiovascular: Normal rate and regular rhythm.  Pulmonary/Chest: Effort normal and breath sounds normal.  Abdominal: Soft. He exhibits no distension. There is no tenderness.  Musculoskeletal:  Lumbar back: He exhibits decreased range of motion, tenderness, bony tenderness, pain and spasm.  He continues to have significant point tenderness over the greater trochanter on the right. He has tenderness to the distal third of the outer thigh as well. He walks with antalgia on that limb.  Range of motion of right hip : Hip flexion 100, abduction 25 , extension deficit of 10  Right hip strength 5 out of 5  Neurological: He is alert and oriented to person, place, and time.  Psychiatric: He has a normal mood and affect. His behavior is normal. Judgment and  thought content normal.         Assessment & Plan:  1. 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 There aren't a lot of treatment options left for me quite frankly.  2. Meralgia paresthetica, left leg.  3. 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 Right leg length discrepancy.  PLAN:  1. Stay with Neurontin to 600 mg t.i.d.  2. oxycodone IR 10 mg 1 q.6 h. p.r.n. #120.  3. Refilled Oxymorphone 30mg  # 60. on a written prescription, because the computer prints opana also on the prescription.  4. Continue with your walking and exercise  program, advised patient to continue walking in the pool of his [employer]. Follow up in 1 month.  Followup in one month

## 2012-07-31 ENCOUNTER — Encounter
Payer: Medicare Other | Attending: Physical Medicine and Rehabilitation | Admitting: Physical Medicine and Rehabilitation

## 2012-07-31 ENCOUNTER — Encounter: Payer: Self-pay | Admitting: Physical Medicine and Rehabilitation

## 2012-07-31 VITALS — BP 162/83 | HR 73 | Resp 14 | Ht 71.0 in | Wt 191.0 lb

## 2012-07-31 DIAGNOSIS — M25559 Pain in unspecified hip: Secondary | ICD-10-CM | POA: Insufficient documentation

## 2012-07-31 DIAGNOSIS — M1610 Unilateral primary osteoarthritis, unspecified hip: Secondary | ICD-10-CM

## 2012-07-31 DIAGNOSIS — M5136 Other intervertebral disc degeneration, lumbar region: Secondary | ICD-10-CM

## 2012-07-31 DIAGNOSIS — M25551 Pain in right hip: Secondary | ICD-10-CM

## 2012-07-31 DIAGNOSIS — M76899 Other specified enthesopathies of unspecified lower limb, excluding foot: Secondary | ICD-10-CM

## 2012-07-31 DIAGNOSIS — M7060 Trochanteric bursitis, unspecified hip: Secondary | ICD-10-CM

## 2012-07-31 DIAGNOSIS — M5417 Radiculopathy, lumbosacral region: Secondary | ICD-10-CM

## 2012-07-31 DIAGNOSIS — Y831 Surgical operation with implant of artificial internal device as the cause of abnormal reaction of the patient, or of later complication, without mention of misadventure at the time of the procedure: Secondary | ICD-10-CM | POA: Insufficient documentation

## 2012-07-31 DIAGNOSIS — M5137 Other intervertebral disc degeneration, lumbosacral region: Secondary | ICD-10-CM

## 2012-07-31 DIAGNOSIS — M5126 Other intervertebral disc displacement, lumbar region: Secondary | ICD-10-CM

## 2012-07-31 DIAGNOSIS — M5116 Intervertebral disc disorders with radiculopathy, lumbar region: Secondary | ICD-10-CM

## 2012-07-31 DIAGNOSIS — T84498A Other mechanical complication of other internal orthopedic devices, implants and grafts, initial encounter: Secondary | ICD-10-CM | POA: Insufficient documentation

## 2012-07-31 DIAGNOSIS — M2170 Unequal limb length (acquired), unspecified site: Secondary | ICD-10-CM | POA: Insufficient documentation

## 2012-07-31 DIAGNOSIS — Z5181 Encounter for therapeutic drug level monitoring: Secondary | ICD-10-CM

## 2012-07-31 DIAGNOSIS — G5710 Meralgia paresthetica, unspecified lower limb: Secondary | ICD-10-CM

## 2012-07-31 DIAGNOSIS — G8929 Other chronic pain: Secondary | ICD-10-CM | POA: Insufficient documentation

## 2012-07-31 DIAGNOSIS — IMO0002 Reserved for concepts with insufficient information to code with codable children: Secondary | ICD-10-CM

## 2012-07-31 MED ORDER — OXYCODONE HCL 10 MG PO TABS: 10.0000 mg | ORAL_TABLET | Freq: Four times a day (QID) | ORAL | Status: DC | PRN

## 2012-07-31 NOTE — Progress Notes (Signed)
Subjective:    Patient ID: Thomas [name], male    DOB: 10/13/1940, 71 y.o.   MRN: [id number]  HPI The patient complains about chronic right hip pain, s/p arthroplasty.  The problem has been stable. He reports that he walks and exercises regulary in a pool, which really gives him relief. He also reports, that his insurance does not pay for his oxymorphone, if there is any mention of opana on the prescription, he states, that he had no problems this time when I wrote a handwritten prescription for his oxymorphone without mentioning Opana.   Pain Inventory Average Pain 6 Pain Right Now 7 My pain is intermittent  In the last 24 hours, has pain interfered with the following? General activity 5 Relation with others 5 Enjoyment of life 6 What TIME of day is your pain at its worst? all the time Sleep (in general) Fair  Pain is worse with: some activites Pain improves with: medication Relief from Meds: 7  Mobility use a cane how many minutes can you walk? 20 ability to climb steps?  yes do you drive?  yes Do you have any goals in this area?  no  Function retired Do you have any goals in this area?  no  Neuro/Psych trouble walking  Prior Studies Any changes since last visit?  no  Physicians involved in your care Any changes since last visit?  no   Family History  Problem Relation Age of Onset  . Emphysema Mother   . Cancer Father     Brain  . Diabetes Son    History   Social History  . Marital Status: Married    Spouse Name: N/A    Number of Children: N/A  . Years of Education: N/A   Social History Main Topics  . Smoking status: Former Smoker    Types: Cigarettes    Quit date: 10/11/2005  . Smokeless tobacco: Never Used  . Alcohol Use: Yes     occasionally  . Drug Use: No  . Sexually Active: None   Other Topics Concern  . None   Social History Narrative  . None   Past Surgical History  Procedure Date  . Shoulder surgery     right  . Carpal  tunnel release 2006    right   . Hip fracture surgery 11/2007    right  . Joint replacement 2009    right total hip replacement  . Coronary angioplasty with stent placement    Past Medical History  Diagnosis Date  . Diabetes mellitus   . CAD (coronary artery disease)   . Hypertension   . Leg pain   . Carotid artery occlusion   . Hyperlipidemia   . AAA (abdominal aortic aneurysm)   . 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 Chronic kidney disease     hx kidney stones   BP 162/83  Pulse 73  Resp 14  Ht 5\' 11"  (1.803 m)  Wt 191 lb (86.637 kg)  BMI 26.64 kg/m2  SpO2 98%     Review of Systems  Constitutional: Positive for diaphoresis.  Musculoskeletal: Positive for arthralgias and gait problem.  All other systems reviewed and are negative.       Objective:   Physical Exam Constitutional: He is oriented to person, place, and time. He appears well-developed and well-nourished.  HENT:  Head: Normocephalic and atraumatic.  Nose: Nose normal.  Mouth/Throat: Oropharynx is clear and moist.  Eyes: EOM are  normal. Pupils are equal, round, and reactive to light.  Neck: Normal range of motion.  Cardiovascular: Normal rate and regular rhythm.  Pulmonary/Chest: Effort normal and breath sounds normal.  Abdominal: Soft. He exhibits no distension. There is no tenderness.  Musculoskeletal:  Lumbar back: He exhibits decreased range of motion, tenderness, bony tenderness, pain and spasm.  He continues to have significant point tenderness over the greater trochanter on the right. He has tenderness to the distal third of the outer thigh as well. He walks with antalgia on that limb.  Range of motion of right hip : Hip flexion 100, abduction 25 , extension deficit of 10  Right hip strength 5 out of 5  Neurological: He is alert and oriented to person, place, and time.  Psychiatric: He has a normal mood and affect. His behavior is normal. Judgment and thought  content normal.         Assessment & Plan:  1. 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 There aren't a lot of treatment options left for me quite frankly.  2. Meralgia paresthetica, left leg.  3. 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 Right leg length discrepancy.  PLAN:  1. Stay with Neurontin to 600 mg t.i.d.  2. oxycodone IR 10 mg 1 q.6 h. p.r.n. #120.  3. Refilled Oxymorphone 30mg  # 60. on a written prescription, because the computer prints opana also on the prescription, and then the insurance does not pay for it.  4. Continue with your walking and exercise program, advised patient to continue walking in the pool of his [employer].  Follow up in 1 month.

## 2012-07-31 NOTE — Addendum Note (Signed)
Addended by: Judd Gaudier on: 07/31/2012 02:37 PM   Modules accepted: Orders

## 2012-07-31 NOTE — Patient Instructions (Signed)
Continue with exercising and walking program. Continue with exercising in the water.

## 2012-08-09 DIAGNOSIS — R0989 Other specified symptoms and signs involving the circulatory and respiratory systems: Secondary | ICD-10-CM | POA: Insufficient documentation

## 2012-08-09 DIAGNOSIS — I739 Peripheral vascular disease, unspecified: Secondary | ICD-10-CM | POA: Insufficient documentation

## 2012-08-09 DIAGNOSIS — E1165 Type 2 diabetes mellitus with hyperglycemia: Secondary | ICD-10-CM | POA: Insufficient documentation

## 2012-08-09 DIAGNOSIS — G4700 Insomnia, unspecified: Secondary | ICD-10-CM | POA: Insufficient documentation

## 2012-08-09 DIAGNOSIS — IMO0002 Reserved for concepts with insufficient information to code with codable children: Secondary | ICD-10-CM | POA: Insufficient documentation

## 2012-08-29 ENCOUNTER — Encounter: Payer: Self-pay | Admitting: Physical Medicine and Rehabilitation

## 2012-08-29 ENCOUNTER — Encounter
Payer: Medicare Other | Attending: Physical Medicine and Rehabilitation | Admitting: Physical Medicine and Rehabilitation

## 2012-08-29 VITALS — BP 133/46 | HR 67 | Resp 14 | Ht 71.0 in | Wt 197.0 lb

## 2012-08-29 DIAGNOSIS — M6530 Trigger finger, unspecified finger: Secondary | ICD-10-CM | POA: Insufficient documentation

## 2012-08-29 DIAGNOSIS — Z96649 Presence of unspecified artificial hip joint: Secondary | ICD-10-CM | POA: Insufficient documentation

## 2012-08-29 DIAGNOSIS — M7060 Trochanteric bursitis, unspecified hip: Secondary | ICD-10-CM

## 2012-08-29 DIAGNOSIS — IMO0002 Reserved for concepts with insufficient information to code with codable children: Secondary | ICD-10-CM

## 2012-08-29 DIAGNOSIS — M79609 Pain in unspecified limb: Secondary | ICD-10-CM

## 2012-08-29 DIAGNOSIS — M5417 Radiculopathy, lumbosacral region: Secondary | ICD-10-CM

## 2012-08-29 DIAGNOSIS — M2170 Unequal limb length (acquired), unspecified site: Secondary | ICD-10-CM

## 2012-08-29 DIAGNOSIS — M76899 Other specified enthesopathies of unspecified lower limb, excluding foot: Secondary | ICD-10-CM

## 2012-08-29 DIAGNOSIS — M79642 Pain in left hand: Secondary | ICD-10-CM

## 2012-08-29 DIAGNOSIS — M5126 Other intervertebral disc displacement, lumbar region: Secondary | ICD-10-CM

## 2012-08-29 DIAGNOSIS — M5116 Intervertebral disc disorders with radiculopathy, lumbar region: Secondary | ICD-10-CM

## 2012-08-29 DIAGNOSIS — M1610 Unilateral primary osteoarthritis, unspecified hip: Secondary | ICD-10-CM

## 2012-08-29 DIAGNOSIS — Y831 Surgical operation with implant of artificial internal device as the cause of abnormal reaction of the patient, or of later complication, without mention of misadventure at the time of the procedure: Secondary | ICD-10-CM | POA: Insufficient documentation

## 2012-08-29 DIAGNOSIS — G5710 Meralgia paresthetica, unspecified lower limb: Secondary | ICD-10-CM

## 2012-08-29 DIAGNOSIS — G8929 Other chronic pain: Secondary | ICD-10-CM

## 2012-08-29 DIAGNOSIS — M25559 Pain in unspecified hip: Secondary | ICD-10-CM

## 2012-08-29 DIAGNOSIS — T84099A Other mechanical complication of unspecified internal joint prosthesis, initial encounter: Secondary | ICD-10-CM | POA: Insufficient documentation

## 2012-08-29 MED ORDER — OXYCODONE HCL 10 MG PO TABS: 10.0000 mg | ORAL_TABLET | Freq: Four times a day (QID) | ORAL | Status: DC | PRN

## 2012-08-29 MED ORDER — OXYMORPHONE HCL ER 30 MG PO TB12: 30.0000 mg | ORAL_TABLET | Freq: Two times a day (BID) | ORAL | Status: DC

## 2012-08-29 NOTE — Patient Instructions (Signed)
You could try Arnica cream for your muscle and joint pain in your hands

## 2012-08-29 NOTE — Progress Notes (Signed)
Subjective:    Patient ID: Thomas [name], male    DOB: 1941/08/03, 71 y.o.   MRN: [id number]  HPI The patient complains about chronic right hip pain, s/p arthroplasty.  The problem has been stable. He reports that he walks and exercises regulary in a pool, which really gives him relief. He also reports, that his insurance does not pay for his oxymorphone, if there is any mention of opana on the prescription, he states, that he had no problems this time when I wrote a handwritten prescription for his oxymorphone without mentioning Opana. Patient complains about mild arthritic pain in his hands bilateral.  Pain Inventory Average Pain 6 Pain Right Now 5 My pain is constant  In the last 24 hours, has pain interfered with the following? General activity 6 Relation with others 6 Enjoyment of life 7 What TIME of day is your pain at its worst? morning Sleep (in general) Fair  Pain is worse with: walking Pain improves with: rest and heat/ice Relief from Meds: 8  Mobility walk without assistance how many minutes can you walk? 15 ability to climb steps?  yes do you drive?  yes transfers alone Do you have any goals in this area?  no  Function retired Do you have any goals in this area?  no  Neuro/Psych No problems in this area  Prior Studies Any changes since last visit?  no  Physicians involved in your care Any changes since last visit?  no   Family History  Problem Relation Age of Onset  . Emphysema Mother   . Cancer Father     Brain  . Diabetes Son    History   Social History  . Marital Status: Married    Spouse Name: N/A    Number of Children: N/A  . Years of Education: N/A   Social History Main Topics  . Smoking status: Former Smoker    Types: Cigarettes    Quit date: 10/11/2005  . Smokeless tobacco: Never Used  . Alcohol Use: Yes     Comment: occasionally  . Drug Use: No  . Sexually Active: None   Other Topics Concern  . None   Social History  Narrative  . None   Past Surgical History  Procedure Date  . Shoulder surgery     right  . Carpal tunnel release 2006    right   . Hip fracture surgery 11/2007    right  . Joint replacement 2009    right total hip replacement  . Coronary angioplasty with stent placement    Past Medical History  Diagnosis Date  . Diabetes mellitus   . CAD (coronary artery disease)   . Hypertension   . Leg pain   . Carotid artery occlusion   . Hyperlipidemia   . AAA (abdominal aortic aneurysm)   . 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 Chronic kidney disease     hx kidney stones   BP 133/46  Pulse 67  Resp 14  Ht 5\' 11"  (1.803 m)  Wt 197 lb (89.359 kg)  BMI 27.48 kg/m2  SpO2 97%     Review of Systems  Constitutional: Positive for diaphoresis.  Musculoskeletal: Positive for back pain.  All other systems reviewed and are negative.       Objective:   Physical Exam Constitutional: He is oriented to person, place, and time. He appears well-developed and well-nourished.  HENT:  Head: Normocephalic and atraumatic.  Nose: Nose  normal.  Mouth/Throat: Oropharynx is clear and moist.  Eyes: EOM are normal. Pupils are equal, round, and reactive to light.  Neck: Normal range of motion.  Cardiovascular: Normal rate and regular rhythm.  Pulmonary/Chest: Effort normal and breath sounds normal.  Abdominal: Soft. He exhibits no distension. There is no tenderness.  Musculoskeletal:  Lumbar back: He exhibits decreased range of motion, tenderness, bony tenderness, pain and spasm.  He continues to have significant point tenderness over the greater trochanter on the right. He has tenderness to the distal third of the outer thigh as well. He walks with antalgia on that limb.  Range of motion of right hip : Hip flexion 100, abduction 25 , extension deficit of 10  Right hip strength 5 out of 5  Neurological: He is alert and oriented to person, place, and time.    Psychiatric: He has a normal mood and affect. His behavior is normal. Judgment and thought content normal.         Assessment & Plan:  1. 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 There aren't a lot of treatment options left for me quite frankly.  2. Meralgia paresthetica, left leg.  3. 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 Right leg length discrepancy.  5. Bilateral hand pain, trigger finger on the left, 5th finger PLAN:  1. Stay with Neurontin to 600 mg t.i.d.  2. oxycodone IR 10 mg 1 q.6 h. p.r.n. #120.  3. Refilled Oxymorphone 30mg  # 60. on a written prescription, because the computer prints opana also on the prescription, and then the insurance does not pay for it.  4. Continue with your walking and exercise program, advised patient to continue walking in the pool of his [employer].  5. Suggested Arnica cream for his mild arthritic pain in his hands bilateral. Follow up in 1 month.

## 2012-09-25 ENCOUNTER — Encounter: Payer: Medicare Other | Attending: Physical Medicine and Rehabilitation | Admitting: Physical Medicine & Rehabilitation

## 2012-09-25 ENCOUNTER — Encounter: Payer: Self-pay | Admitting: Physical Medicine & Rehabilitation

## 2012-09-25 DIAGNOSIS — M19049 Primary osteoarthritis, unspecified hand: Secondary | ICD-10-CM | POA: Insufficient documentation

## 2012-09-25 DIAGNOSIS — M2170 Unequal limb length (acquired), unspecified site: Secondary | ICD-10-CM | POA: Insufficient documentation

## 2012-09-25 DIAGNOSIS — G5710 Meralgia paresthetica, unspecified lower limb: Secondary | ICD-10-CM | POA: Insufficient documentation

## 2012-09-25 DIAGNOSIS — M5417 Radiculopathy, lumbosacral region: Secondary | ICD-10-CM

## 2012-09-25 DIAGNOSIS — M5126 Other intervertebral disc displacement, lumbar region: Secondary | ICD-10-CM | POA: Insufficient documentation

## 2012-09-25 DIAGNOSIS — M5116 Intervertebral disc disorders with radiculopathy, lumbar region: Secondary | ICD-10-CM

## 2012-09-25 DIAGNOSIS — M1610 Unilateral primary osteoarthritis, unspecified hip: Secondary | ICD-10-CM | POA: Insufficient documentation

## 2012-09-25 DIAGNOSIS — M76899 Other specified enthesopathies of unspecified lower limb, excluding foot: Secondary | ICD-10-CM | POA: Insufficient documentation

## 2012-09-25 DIAGNOSIS — IMO0002 Reserved for concepts with insufficient information to code with codable children: Secondary | ICD-10-CM | POA: Insufficient documentation

## 2012-09-25 DIAGNOSIS — M7060 Trochanteric bursitis, unspecified hip: Secondary | ICD-10-CM

## 2012-09-25 MED ORDER — OXYMORPHONE HCL ER 30 MG PO TB12: 30.0000 mg | ORAL_TABLET | Freq: Two times a day (BID) | ORAL | Status: DC

## 2012-09-25 MED ORDER — OXYMORPHONE HCL 10 MG PO TABS: 10.0000 mg | ORAL_TABLET | Freq: Four times a day (QID) | ORAL | Status: DC | PRN

## 2012-09-25 MED ORDER — OXYMORPHONE HCL ER 30 MG PO T12A: 30.0000 mg | EXTENDED_RELEASE_TABLET | Freq: Two times a day (BID) | ORAL | Status: DC

## 2012-09-25 MED ORDER — OXYMORPHONE HCL 10 MG PO TABS: 10.0000 mg | ORAL_TABLET | ORAL | Status: DC | PRN

## 2012-09-25 MED ORDER — DICLOFENAC SODIUM 1 % TD GEL: 2.0000 g | Freq: Three times a day (TID) | TRANSDERMAL | Status: DC

## 2012-09-25 NOTE — Progress Notes (Signed)
Subjective:    Patient ID: Thomas [name], male    DOB: 05-24-1941, 71 y.o.   MRN: [id number]  HPI  Thomas [name] is back regarding his right leg/hip pain. He feels the opana ER is somewhat effective while the oxycodone doesn't seem to help with the breakthrough pain. He has noticed more pain over the last month with the cold weather setting in. Typically his pain is worse the more he weight bears. Pain remains most promiment in the mid right thigh along the anterior-lateral aspects.   He has noticed increased hand pain also since the mid fall. Clydie Braun reccommended an OTC herbal supplement which he feels provided som benefit.   Pain Inventory Average Pain 6 Pain Right Now 7 My pain is sharp, stabbing and aching  In the last 24 hours, has pain interfered with the following? General activity 5 Relation with others 5 Enjoyment of life 6 What TIME of day is your pain at its worst? varies Sleep (in general) Fair  Pain is worse with: walking Pain improves with: rest, heat/ice and medication Relief from Meds: 4  Mobility walk without assistance how many minutes can you walk? 15 ability to climb steps?  yes do you drive?  yes  Function retired  Neuro/Psych trouble walking  Prior Studies Any changes since last visit?  no  Physicians involved in your care Any changes since last visit?  no   Family History  Problem Relation Age of Onset  . Emphysema Mother   . Cancer Father     Brain  . Diabetes Son    History   Social History  . Marital Status: Married    Spouse Name: N/A    Number of Children: N/A  . Years of Education: N/A   Social History Main Topics  . Smoking status: Former Smoker    Types: Cigarettes    Quit date: 10/11/2005  . Smokeless tobacco: Never Used  . Alcohol Use: Yes     Comment: occasionally  . Drug Use: No  . Sexually Active: None   Other Topics Concern  . None   Social History Narrative  . None   Past Surgical History  Procedure Date  .  Shoulder surgery     right  . Carpal tunnel release 2006    right   . Hip fracture surgery 11/2007    right  . Joint replacement 2009    right total hip replacement  . Coronary angioplasty with stent placement    Past Medical History  Diagnosis Date  . Diabetes mellitus   . CAD (coronary artery disease)   . Hypertension   . Leg pain   . Carotid artery occlusion   . Hyperlipidemia   . AAA (abdominal aortic aneurysm)   . 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 Chronic kidney disease     hx kidney stones   There were no vitals taken for this visit.    Review of Systems  Constitutional: Positive for diaphoresis.  Musculoskeletal: Positive for back pain.  All other systems reviewed and are negative.       Objective:   Physical Exam Constitutional: He is oriented to person, place, and time. He appears well-developed and well-nourished.  HENT:  Head: Normocephalic and atraumatic.  Nose: Nose normal.  Mouth/Throat: Oropharynx is clear and moist.  Eyes: EOM are normal. Pupils are equal, round, and reactive to light.  Neck: Normal range of motion.  Cardiovascular: Normal rate and regular rhythm.  Pulmonary/Chest: Effort normal and breath sounds normal.  Abdominal: Soft. He exhibits no distension. There is no tenderness.  Musculoskeletal:  Lumbar back: He exhibits decreased range of motion, tenderness, bony tenderness, pain and spasm.  He continues to have significant point tenderness over the greater trochanter on the right. He has tenderness to the distal third of the outer thigh as well. He walks with antalgia on that limb and needs a cane for balance. Low back is tender to palpation over the psis and the right lower lumbar paraspinals. He has pain in the fingers, especially the PIP and DIP joints without swelling or gross changes.  Neurological: He is alert and oriented to person, place, and time.  Psychiatric: He has a normal mood and affect.  His behavior is normal. Judgment and thought content normal.  Assessment & Plan:   ASSESSMENT:  1. 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 The opana has helped this pain.  2. Meralgia paresthetica, left leg.  3. Lumbar radiculopathy.  contribution from his right trochanteric bursitis as well  4. Right leg length discrepancy.   PLAN:  1. Stay with Neurontin to 600 mg t.i.d.  2. Will try oxymorphone 10mg  (generic) for breakthrough pain as the oxycodone IR has not been helpful. 3. Opana ER-- maintain at 30mg  q12. 4..F/u with [facility] as indicated.  5. My PA will see him back next month.  6. Wrote rx for Voltaren gel which he can take along with his OTC cream.

## 2012-10-26 ENCOUNTER — Encounter
Payer: Medicare Other | Attending: Physical Medicine and Rehabilitation | Admitting: Physical Medicine and Rehabilitation

## 2012-10-26 ENCOUNTER — Encounter: Payer: Self-pay | Admitting: Physical Medicine and Rehabilitation

## 2012-10-26 VITALS — BP 134/68 | HR 75 | Resp 14 | Ht 71.0 in | Wt 193.0 lb

## 2012-10-26 DIAGNOSIS — M7060 Trochanteric bursitis, unspecified hip: Secondary | ICD-10-CM

## 2012-10-26 DIAGNOSIS — G5710 Meralgia paresthetica, unspecified lower limb: Secondary | ICD-10-CM | POA: Insufficient documentation

## 2012-10-26 DIAGNOSIS — M5126 Other intervertebral disc displacement, lumbar region: Secondary | ICD-10-CM

## 2012-10-26 DIAGNOSIS — I10 Essential (primary) hypertension: Secondary | ICD-10-CM | POA: Insufficient documentation

## 2012-10-26 DIAGNOSIS — E119 Type 2 diabetes mellitus without complications: Secondary | ICD-10-CM | POA: Insufficient documentation

## 2012-10-26 DIAGNOSIS — M6530 Trigger finger, unspecified finger: Secondary | ICD-10-CM | POA: Insufficient documentation

## 2012-10-26 DIAGNOSIS — M79609 Pain in unspecified limb: Secondary | ICD-10-CM | POA: Insufficient documentation

## 2012-10-26 DIAGNOSIS — E785 Hyperlipidemia, unspecified: Secondary | ICD-10-CM | POA: Insufficient documentation

## 2012-10-26 DIAGNOSIS — M1610 Unilateral primary osteoarthritis, unspecified hip: Secondary | ICD-10-CM

## 2012-10-26 DIAGNOSIS — G8929 Other chronic pain: Secondary | ICD-10-CM | POA: Insufficient documentation

## 2012-10-26 DIAGNOSIS — M2170 Unequal limb length (acquired), unspecified site: Secondary | ICD-10-CM | POA: Insufficient documentation

## 2012-10-26 DIAGNOSIS — M76899 Other specified enthesopathies of unspecified lower limb, excluding foot: Secondary | ICD-10-CM

## 2012-10-26 DIAGNOSIS — Z96649 Presence of unspecified artificial hip joint: Secondary | ICD-10-CM | POA: Insufficient documentation

## 2012-10-26 DIAGNOSIS — M5417 Radiculopathy, lumbosacral region: Secondary | ICD-10-CM

## 2012-10-26 DIAGNOSIS — M47817 Spondylosis without myelopathy or radiculopathy, lumbosacral region: Secondary | ICD-10-CM

## 2012-10-26 DIAGNOSIS — M19049 Primary osteoarthritis, unspecified hand: Secondary | ICD-10-CM

## 2012-10-26 DIAGNOSIS — M5116 Intervertebral disc disorders with radiculopathy, lumbar region: Secondary | ICD-10-CM

## 2012-10-26 DIAGNOSIS — M47816 Spondylosis without myelopathy or radiculopathy, lumbar region: Secondary | ICD-10-CM

## 2012-10-26 DIAGNOSIS — M25559 Pain in unspecified hip: Secondary | ICD-10-CM | POA: Insufficient documentation

## 2012-10-26 DIAGNOSIS — I2510 Atherosclerotic heart disease of native coronary artery without angina pectoris: Secondary | ICD-10-CM | POA: Insufficient documentation

## 2012-10-26 DIAGNOSIS — IMO0002 Reserved for concepts with insufficient information to code with codable children: Secondary | ICD-10-CM

## 2012-10-26 MED ORDER — OXYMORPHONE HCL ER 30 MG PO T12A: 30.0000 mg | EXTENDED_RELEASE_TABLET | Freq: Two times a day (BID) | ORAL | Status: DC

## 2012-10-26 MED ORDER — OXYCODONE HCL 10 MG PO TABS: 10.0000 mg | ORAL_TABLET | Freq: Four times a day (QID) | ORAL | Status: DC | PRN

## 2012-10-26 NOTE — Progress Notes (Signed)
Subjective:    Patient ID: Thomas [name], male    DOB: 06/26/1941, 72 y.o.   MRN: [id number]  HPI The patient complains about chronic right hip pain, s/p arthroplasty.  The problem has been stable. He reports that he walks and exercises regulary in a pool, which really gives him relief. He also reports, that his insurance does not pay for his oxymorphone, if there is any mention of opana on the prescription, he states, that he had no problems this time when I wrote a handwritten prescription for his oxymorphone without mentioning Opana. Patient reports, that the mild arthritic pain in his hands bilateral has improved with the Arnica cream.  Pain Inventory Average Pain 6 Pain Right Now 5 My pain is dull and aching  In the last 24 hours, has pain interfered with the following? General activity 4 Relation with others 5 Enjoyment of life 6 What TIME of day is your pain at its worst? day, evening Sleep (in general) Fair  Pain is worse with: walking Pain improves with: rest and medication Relief from Meds: 7  Mobility walk without assistance how many minutes can you walk? 15 ability to climb steps?  yes do you drive?  yes Do you have any goals in this area?  no  Function not employed: date last employed 09 retired Do you have any goals in this area?  no  Neuro/Psych numbness trouble walking  Prior Studies Any changes since last visit?  no  Physicians involved in your care Any changes since last visit?  no   Family History  Problem Relation Age of Onset  . Emphysema Mother   . Cancer Father     Brain  . Diabetes Son    History   Social History  . Marital Status: Married    Spouse Name: N/A    Number of Children: N/A  . Years of Education: N/A   Social History Main Topics  . Smoking status: Former Smoker    Types: Cigarettes    Quit date: 10/11/2005  . Smokeless tobacco: Never Used  . Alcohol Use: Yes     Comment: occasionally  . Drug Use: No  .  Sexually Active: None   Other Topics Concern  . None   Social History Narrative  . None   Past Surgical History  Procedure Date  . Shoulder surgery     right  . Carpal tunnel release 2006    right   . Hip fracture surgery 11/2007    right  . Joint replacement 2009    right total hip replacement  . Coronary angioplasty with stent placement    Past Medical History  Diagnosis Date  . Diabetes mellitus   . CAD (coronary artery disease)   . Hypertension   . Leg pain   . Carotid artery occlusion   . Hyperlipidemia   . AAA (abdominal aortic aneurysm)   . 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 Chronic kidney disease     hx kidney stones   BP 134/68  Pulse 75  Resp 14  Ht 5\' 11"  (1.803 m)  Wt 193 lb (87.544 kg)  BMI 26.92 kg/m2  SpO2 98%     Review of Systems  Constitutional: Positive for diaphoresis and appetite change.  Musculoskeletal: Positive for back pain and gait problem.  Neurological: Positive for numbness.  All other systems reviewed and are negative.       Objective:   Physical Exam Constitutional:  He is oriented to person, place, and time. He appears well-developed and well-nourished.  HENT:  Head: Normocephalic and atraumatic.  Nose: Nose normal.  Mouth/Throat: Oropharynx is clear and moist.  Eyes: EOM are normal. Pupils are equal, round, and reactive to light.  Neck: Normal range of motion.  Cardiovascular: Normal rate and regular rhythm.  Pulmonary/Chest: Effort normal and breath sounds normal.  Abdominal: Soft. He exhibits no distension. There is no tenderness.  Musculoskeletal:  Lumbar back: He exhibits decreased range of motion, tenderness, bony tenderness, pain and spasm.  He continues to have significant point tenderness over the greater trochanter on the right. He has tenderness to the distal third of the outer thigh as well. He walks with antalgia on that limb.  Range of motion of right hip : Hip flexion  100, abduction 25 , extension deficit of 10  Right hip strength 5 out of 5  Neurological: He is alert and oriented to person, place, and time.  Psychiatric: He has a normal mood and affect. His behavior is normal. Judgment and thought content normal.         Assessment & Plan:  1. 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 There aren't a lot of treatment options left for me quite frankly.  2. Meralgia paresthetica, left leg.  3. 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 Right leg length discrepancy.  5. Bilateral hand pain, trigger finger on the left, 5th finger  PLAN:  1. Stay with Neurontin to 600 mg t.i.d.  2. Refilled  oxycodone IR 10 mg 1 q.6 h. p.r.n. #120. Patient tried oxymorphone 10mg  last month, which did not help at all, he wanted to go back to his former medication. 3. Refilled Oxymorphone 30mg  # 60. 4. Continue with your walking and exercise program, advised patient to continue walking in the pool of his [employer].  5. Continue with Arnica cream for his mild arthritic pain in his hands bilateral.  Follow up in 1 month.

## 2012-11-10 ENCOUNTER — Other Ambulatory Visit: Payer: Self-pay | Admitting: Physical Medicine & Rehabilitation

## 2012-11-21 ENCOUNTER — Encounter: Payer: Self-pay | Admitting: Physical Medicine and Rehabilitation

## 2012-11-21 ENCOUNTER — Encounter
Payer: Medicare Other | Attending: Physical Medicine and Rehabilitation | Admitting: Physical Medicine and Rehabilitation

## 2012-11-21 VITALS — BP 137/70 | HR 61 | Resp 14 | Ht 71.0 in | Wt 194.0 lb

## 2012-11-21 DIAGNOSIS — M5417 Radiculopathy, lumbosacral region: Secondary | ICD-10-CM

## 2012-11-21 DIAGNOSIS — M1610 Unilateral primary osteoarthritis, unspecified hip: Secondary | ICD-10-CM

## 2012-11-21 DIAGNOSIS — G8929 Other chronic pain: Secondary | ICD-10-CM | POA: Insufficient documentation

## 2012-11-21 DIAGNOSIS — M47817 Spondylosis without myelopathy or radiculopathy, lumbosacral region: Secondary | ICD-10-CM

## 2012-11-21 DIAGNOSIS — M7060 Trochanteric bursitis, unspecified hip: Secondary | ICD-10-CM

## 2012-11-21 DIAGNOSIS — M2170 Unequal limb length (acquired), unspecified site: Secondary | ICD-10-CM

## 2012-11-21 DIAGNOSIS — M25559 Pain in unspecified hip: Secondary | ICD-10-CM

## 2012-11-21 DIAGNOSIS — T84099A Other mechanical complication of unspecified internal joint prosthesis, initial encounter: Secondary | ICD-10-CM | POA: Insufficient documentation

## 2012-11-21 DIAGNOSIS — G5710 Meralgia paresthetica, unspecified lower limb: Secondary | ICD-10-CM

## 2012-11-21 DIAGNOSIS — M5126 Other intervertebral disc displacement, lumbar region: Secondary | ICD-10-CM

## 2012-11-21 DIAGNOSIS — M5116 Intervertebral disc disorders with radiculopathy, lumbar region: Secondary | ICD-10-CM

## 2012-11-21 DIAGNOSIS — M19049 Primary osteoarthritis, unspecified hand: Secondary | ICD-10-CM

## 2012-11-21 DIAGNOSIS — Y831 Surgical operation with implant of artificial internal device as the cause of abnormal reaction of the patient, or of later complication, without mention of misadventure at the time of the procedure: Secondary | ICD-10-CM | POA: Insufficient documentation

## 2012-11-21 DIAGNOSIS — IMO0002 Reserved for concepts with insufficient information to code with codable children: Secondary | ICD-10-CM

## 2012-11-21 DIAGNOSIS — Z96649 Presence of unspecified artificial hip joint: Secondary | ICD-10-CM | POA: Insufficient documentation

## 2012-11-21 DIAGNOSIS — M79609 Pain in unspecified limb: Secondary | ICD-10-CM | POA: Insufficient documentation

## 2012-11-21 DIAGNOSIS — M76899 Other specified enthesopathies of unspecified lower limb, excluding foot: Secondary | ICD-10-CM

## 2012-11-21 DIAGNOSIS — M6530 Trigger finger, unspecified finger: Secondary | ICD-10-CM | POA: Insufficient documentation

## 2012-11-21 MED ORDER — OXYCODONE HCL 10 MG PO TABS: 10.0000 mg | ORAL_TABLET | Freq: Four times a day (QID) | ORAL | Status: DC | PRN

## 2012-11-21 MED ORDER — OXYMORPHONE HCL ER 30 MG PO T12A: 30.0000 mg | EXTENDED_RELEASE_TABLET | Freq: Two times a day (BID) | ORAL | Status: DC

## 2012-11-21 NOTE — Patient Instructions (Signed)
Stay as active as pain permits. Continue with walking on your treadmill.

## 2012-11-21 NOTE — Progress Notes (Signed)
Subjective:    Patient ID: Thomas [name], male    DOB: 1941/07/17, 72 y.o.   MRN: [id number]  HPI The patient complains about chronic right hip pain, s/p arthroplasty.  The problem has been stable. He reports that he walks and exercises regulary in a pool, which really gives him relief. He also reports, that his insurance does not pay for his oxymorphone, if there is any mention of opana on the prescription, he states, that he had no problems this time when I wrote a handwritten prescription for his oxymorphone without mentioning Opana. Patient reports, that the mild arthritic pain in his hands bilateral has improved with the Arnica cream.  Pain Inventory Average Pain 5 Pain Right Now 6 My pain is dull and stabbing  In the last 24 hours, has pain interfered with the following? General activity 4 Relation with others 4 Enjoyment of life 5 What TIME of day is your pain at its worst? night Sleep (in general) Fair  Pain is worse with: walking and sitting Pain improves with: rest and medication Relief from Meds: 6  Mobility walk without assistance how many minutes can you walk? 15 ability to climb steps?  yes do you drive?  yes Do you have any goals in this area?  no  Function retired I need assistance with the following:  shopping  Neuro/Psych trouble walking  Prior Studies Any changes since last visit?  no  Physicians involved in your care Any changes since last visit?  no   Family History  Problem Relation Age of Onset  . Emphysema Mother   . Cancer Father     Brain  . Diabetes Son    History   Social History  . Marital Status: Married    Spouse Name: N/A    Number of Children: N/A  . Years of Education: N/A   Social History Main Topics  . Smoking status: Former Smoker    Types: Cigarettes    Quit date: 10/11/2005  . Smokeless tobacco: Never Used  . Alcohol Use: Yes     Comment: occasionally  . Drug Use: No  . Sexually Active: None   Other Topics  Concern  . None   Social History Narrative  . None   Past Surgical History  Procedure Laterality Date  . Shoulder surgery      right  . Carpal tunnel release  2006    right   . Hip fracture surgery  11/2007    right  . Joint replacement  2009    right total hip replacement  . Coronary angioplasty with stent placement     Past Medical History  Diagnosis Date  . Diabetes mellitus   . CAD (coronary artery disease)   . Hypertension   . Leg pain   . Carotid artery occlusion   . Hyperlipidemia   . AAA (abdominal aortic aneurysm)   . 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 Chronic kidney disease     hx kidney stones   BP 137/70  Pulse 61  Resp 14  Ht 5\' 11"  (1.803 m)  Wt 194 lb (87.998 kg)  BMI 27.07 kg/m2  SpO2 98%     Review of Systems  Constitutional: Positive for diaphoresis.  Musculoskeletal: Positive for gait problem.  All other systems reviewed and are negative.       Objective:   Physical Exam Constitutional: He is oriented to person, place, and time. He appears well-developed and well-nourished.  HENT:  Head: Normocephalic and atraumatic.  Nose: Nose normal.  Mouth/Throat: Oropharynx is clear and moist.  Eyes: EOM are normal. Pupils are equal, round, and reactive to light.  Neck: Normal range of motion.  Cardiovascular: Normal rate and regular rhythm.  Pulmonary/Chest: Effort normal and breath sounds normal.  Abdominal: Soft. He exhibits no distension. There is no tenderness.  Musculoskeletal:  Lumbar back: He exhibits decreased range of motion, tenderness, bony tenderness, pain and spasm.  He continues to have significant point tenderness over the greater trochanter on the right. He has tenderness to the distal third of the outer thigh as well. He walks with antalgia on that limb.  Range of motion of right hip : Hip flexion 100, abduction 25 , extension deficit of 10  Right hip strength 5 out of 5  Neurological: He  is alert and oriented to person, place, and time.  Psychiatric: He has a normal mood and affect. His behavior is normal. Judgment and thought content normal.         Assessment & Plan:  1. 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 There aren't a lot of treatment options left for me quite frankly.  2. Meralgia paresthetica, left leg.  3. 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 Right leg length discrepancy.  5. Bilateral hand pain, trigger finger on the left, 5th finger  PLAN:  1. Stay with Neurontin to 600 mg t.i.d.  2. Refilled oxycodone IR 10 mg 1 q.6 h. p.r.n. #120. Patient tried oxymorphone 10mg  last month, which did not help at all, he wanted to go back to his former medication.  3. Refilled Oxymorphone 30mg  # 60.  4. Continue with your walking and exercise program, advised patient to continue walking in the pool of his [employer].  5. Continue with Arnica cream for his mild arthritic pain in his hands bilateral.  Follow up in 1 month.

## 2012-11-22 ENCOUNTER — Encounter: Payer: Medicare Other | Admitting: Physical Medicine and Rehabilitation

## 2012-12-18 ENCOUNTER — Encounter
Payer: Medicare Other | Attending: Physical Medicine and Rehabilitation | Admitting: Physical Medicine and Rehabilitation

## 2012-12-18 ENCOUNTER — Encounter: Payer: Self-pay | Admitting: Physical Medicine and Rehabilitation

## 2012-12-18 VITALS — BP 143/76 | HR 74 | Resp 14 | Ht 71.0 in | Wt 194.0 lb

## 2012-12-18 DIAGNOSIS — T84099A Other mechanical complication of unspecified internal joint prosthesis, initial encounter: Secondary | ICD-10-CM | POA: Insufficient documentation

## 2012-12-18 DIAGNOSIS — G5710 Meralgia paresthetica, unspecified lower limb: Secondary | ICD-10-CM | POA: Insufficient documentation

## 2012-12-18 DIAGNOSIS — IMO0002 Reserved for concepts with insufficient information to code with codable children: Secondary | ICD-10-CM

## 2012-12-18 DIAGNOSIS — M2170 Unequal limb length (acquired), unspecified site: Secondary | ICD-10-CM | POA: Insufficient documentation

## 2012-12-18 DIAGNOSIS — M5126 Other intervertebral disc displacement, lumbar region: Secondary | ICD-10-CM

## 2012-12-18 DIAGNOSIS — M1610 Unilateral primary osteoarthritis, unspecified hip: Secondary | ICD-10-CM

## 2012-12-18 DIAGNOSIS — Y831 Surgical operation with implant of artificial internal device as the cause of abnormal reaction of the patient, or of later complication, without mention of misadventure at the time of the procedure: Secondary | ICD-10-CM | POA: Insufficient documentation

## 2012-12-18 DIAGNOSIS — M549 Dorsalgia, unspecified: Secondary | ICD-10-CM

## 2012-12-18 DIAGNOSIS — Z96649 Presence of unspecified artificial hip joint: Secondary | ICD-10-CM | POA: Insufficient documentation

## 2012-12-18 DIAGNOSIS — G8929 Other chronic pain: Secondary | ICD-10-CM | POA: Insufficient documentation

## 2012-12-18 DIAGNOSIS — M6530 Trigger finger, unspecified finger: Secondary | ICD-10-CM | POA: Insufficient documentation

## 2012-12-18 DIAGNOSIS — M76899 Other specified enthesopathies of unspecified lower limb, excluding foot: Secondary | ICD-10-CM

## 2012-12-18 MED ORDER — OXYCODONE HCL 10 MG PO TABS: 10.0000 mg | ORAL_TABLET | Freq: Four times a day (QID) | ORAL | Status: DC | PRN

## 2012-12-18 MED ORDER — OXYMORPHONE HCL ER 30 MG PO T12A: 30.0000 mg | EXTENDED_RELEASE_TABLET | Freq: Two times a day (BID) | ORAL | Status: DC

## 2012-12-18 NOTE — Patient Instructions (Signed)
Continue with your aquatic exercises 

## 2012-12-18 NOTE — Progress Notes (Signed)
Subjective:    Patient ID: Thomas [name], male    DOB: 1941-06-12, 72 y.o.   MRN: [id number]  HPI The patient complains about chronic right hip pain, s/p arthroplasty.  The problem has been stable, the medication controls his pain most of the time. He reports that he walks and exercises regulary in a pool, which really gives him relief. He also reports, that his insurance does not pay for his oxymorphone, if there is any mention of opana on the prescription, he states, that he had no problems this time when I wrote a handwritten prescription for his oxymorphone without mentioning Opana. Patient reports, that the mild arthritic pain in his hands bilateral has improved with the Arnica cream.  Pain Inventory  Average Pain 5  Pain Right Now 6  My pain is dull and stabbing  In the last 24 hours, has pain interfered with the following?  General activity 4  Relation with others 4  Enjoyment of life 5  What TIME of day is your pain at its worst? night  Sleep (in general) Fair  Pain is worse with: walking and sitting  Pain improves with: rest and medication  Relief from Meds: 6  Mobility  walk without assistance  how many minutes can you walk? 15  ability to climb steps? yes  do you drive? yes  Do you have any goals in this area? no  Function  retired  I need assistance with the following: shopping  Neuro/Psych  trouble walking  Prior Studies  Any changes since last visit? no  Physicians involved in your care  Any changes since last visit? no    Family History  Problem Relation Age of Onset  . Emphysema Mother   . Cancer Father     Brain  . Diabetes Son    History   Social History  . Marital Status: Married    Spouse Name: N/A    Number of Children: N/A  . Years of Education: N/A   Social History Main Topics  . Smoking status: Former Smoker    Types: Cigarettes    Quit date: 10/11/2005  . Smokeless tobacco: Never Used  . Alcohol Use: Yes     Comment: occasionally   . Drug Use: No  . Sexually Active: None   Other Topics Concern  . None   Social History Narrative  . None   Past Surgical History  Procedure Laterality Date  . Shoulder surgery      right  . Carpal tunnel release  2006    right   . Hip fracture surgery  11/2007    right  . Joint replacement  2009    right total hip replacement  . Coronary angioplasty with stent placement     Past Medical History  Diagnosis Date  . Diabetes mellitus   . CAD (coronary artery disease)   . Hypertension   . Leg pain   . Carotid artery occlusion   . Hyperlipidemia   . AAA (abdominal aortic aneurysm)   . 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 Chronic kidney disease     hx kidney stones   BP 143/76  Pulse 74  Resp 14  Ht 5\' 11"  (1.803 m)  Wt 194 lb (87.998 kg)  BMI 27.07 kg/m2  SpO2 98%     Review of Systems  Musculoskeletal: Positive for back pain.  All other systems reviewed and are negative.  Objective:   Physical Exam Constitutional: He is oriented to person, place, and time. He appears well-developed and well-nourished.  HENT:  Head: Normocephalic and atraumatic.  Nose: Nose normal.  Mouth/Throat: Oropharynx is clear and moist.  Eyes: EOM are normal. Pupils are equal, round, and reactive to light.  Neck: Normal range of motion.  Cardiovascular: Normal rate and regular rhythm.  Pulmonary/Chest: Effort normal and breath sounds normal.  Abdominal: Soft. He exhibits no distension. There is no tenderness.  Musculoskeletal:  Lumbar back: He exhibits decreased range of motion, tenderness, bony tenderness, pain and spasm.  He continues to have significant point tenderness over the greater trochanter on the right. He has tenderness to the distal third of the outer thigh as well. He walks with antalgia on that limb.  Range of motion of right hip : Hip flexion 100, abduction 25 , extension deficit of 10  Right hip strength 5 out of 5   Neurological: He is alert and oriented to person, place, and time.  Psychiatric: He has a normal mood and affect. His behavior is normal. Judgment and thought content normal.         Assessment & Plan:  1. 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 There aren't a lot of treatment options left for me quite frankly.  2. Meralgia paresthetica, left leg.  3. 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 Right leg length discrepancy.  5. Bilateral hand pain, trigger finger on the left, 5th finger  PLAN:  1. Stay with Neurontin to 600 mg t.i.d.  2. Refilled oxycodone IR 10 mg 1 q.6 h. p.r.n. #120. Patient tried oxymorphone 10mg  last month, which did not help at all, he wanted to go back to his former medication.  3. Refilled Oxymorphone 30mg  # 60.  4. Continue with your walking and exercise program, advised patient to continue walking in the pool of his [employer].  5. Continue with Arnica cream for his mild arthritic pain in his hands bilateral.  Follow up in 1 month.

## 2012-12-19 ENCOUNTER — Telehealth: Payer: Self-pay | Admitting: *Deleted

## 2012-12-19 DIAGNOSIS — M1610 Unilateral primary osteoarthritis, unspecified hip: Secondary | ICD-10-CM

## 2012-12-19 MED ORDER — MORPHINE SULFATE ER 60 MG PO TBCR: 60.0000 mg | EXTENDED_RELEASE_TABLET | Freq: Two times a day (BID) | ORAL | Status: DC

## 2012-12-19 MED ORDER — MORPHINE SULFATE ER 30 MG PO TBCR: 30.0000 mg | EXTENDED_RELEASE_TABLET | Freq: Two times a day (BID) | ORAL | Status: DC

## 2012-12-19 NOTE — Telephone Encounter (Signed)
Ok, after convertion and talking to Dr. Riley Kill, we should prescribe MS contin 60 mg bid.

## 2012-12-19 NOTE — Telephone Encounter (Signed)
Thomas [name]'s opana 30 mg is going to cost him $400. He wants to change to Morphine Sulfate ER 30 mg instead. It will only be around $3

## 2013-01-16 ENCOUNTER — Encounter
Payer: Medicare Other | Attending: Physical Medicine and Rehabilitation | Admitting: Physical Medicine and Rehabilitation

## 2013-01-16 ENCOUNTER — Encounter: Payer: Self-pay | Admitting: Physical Medicine and Rehabilitation

## 2013-01-16 VITALS — BP 160/78 | HR 80 | Resp 14 | Ht 71.0 in | Wt 191.0 lb

## 2013-01-16 DIAGNOSIS — IMO0002 Reserved for concepts with insufficient information to code with codable children: Secondary | ICD-10-CM

## 2013-01-16 DIAGNOSIS — M2170 Unequal limb length (acquired), unspecified site: Secondary | ICD-10-CM

## 2013-01-16 DIAGNOSIS — M5116 Intervertebral disc disorders with radiculopathy, lumbar region: Secondary | ICD-10-CM

## 2013-01-16 DIAGNOSIS — M5417 Radiculopathy, lumbosacral region: Secondary | ICD-10-CM

## 2013-01-16 DIAGNOSIS — M1610 Unilateral primary osteoarthritis, unspecified hip: Secondary | ICD-10-CM

## 2013-01-16 DIAGNOSIS — M5126 Other intervertebral disc displacement, lumbar region: Secondary | ICD-10-CM

## 2013-01-16 MED ORDER — OXYCODONE HCL 10 MG PO TABS: 10.0000 mg | ORAL_TABLET | Freq: Four times a day (QID) | ORAL | Status: DC | PRN

## 2013-01-16 MED ORDER — MORPHINE SULFATE ER 60 MG PO TBCR: 60.0000 mg | EXTENDED_RELEASE_TABLET | Freq: Two times a day (BID) | ORAL | Status: DC

## 2013-01-16 NOTE — Patient Instructions (Signed)
Continue with your exercise program 

## 2013-01-16 NOTE — Progress Notes (Signed)
Subjective:    Patient ID: Thomas [name], male    DOB: [birth year], 72 y.o.   MRN: [id number]  HPI The patient complains about chronic right hip pain, s/p arthroplasty.  The problem has been stable, the medication controls his pain most of the time. He reports that he walks and exercises regulary in a pool, which really gives him relief.  Patient reports, that the mild arthritic pain in his hands bilateral has improved with the Arnica cream.  Pain Inventory Average Pain 5 Pain Right Now 6 My pain is constant, sharp and aching  In the last 24 hours, has pain interfered with the following? General activity 5 Relation with others 5 Enjoyment of life 6 What TIME of day is your pain at its worst? daytime Sleep (in general) Fair  Pain is worse with: walking Pain improves with: rest and medication Relief from Meds: 7  Mobility walk without assistance how many minutes can you walk? 20 ability to climb steps?  yes do you drive?  yes transfers alone Do you have any goals in this area?  no  Function retired Do you have any goals in this area?  no  Neuro/Psych trouble walking  Prior Studies Any changes since last visit?  no  Physicians involved in your care Any changes since last visit?  no   Family History  Problem Relation Age of Onset  . Emphysema Mother   . Cancer Father     Brain  . Diabetes Son    History   Social History  . Marital Status: Married    Spouse Name: N/A    Number of Children: N/A  . Years of Education: N/A   Social History Main Topics  . Smoking status: Former Smoker    Types: Cigarettes    Quit date: 10/11/2005  . Smokeless tobacco: Never Used  . Alcohol Use: Yes     Comment: occasionally  . Drug Use: No  . Sexually Active: None   Other Topics Concern  . None   Social History Narrative  . None   Past Surgical History  Procedure Laterality Date  . Shoulder surgery      right  . Carpal tunnel release  2006    right   . Hip  fracture surgery  11/2007    right  . Joint replacement  2009    right total hip replacement  . Coronary angioplasty with stent placement     Past Medical History  Diagnosis Date  . Diabetes mellitus   . CAD (coronary artery disease)   . Hypertension   . Leg pain   . Carotid artery occlusion   . Hyperlipidemia   . AAA (abdominal aortic aneurysm)   . 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 Chronic kidney disease     hx kidney stones   BP 160/78  Pulse 80  Resp 14  Ht 5\' 11"  (1.803 m)  Wt 191 lb (86.637 kg)  BMI 26.65 kg/m2  SpO2 98%     Review of Systems  Constitutional: Positive for diaphoresis.  Respiratory: Positive for cough.   Gastrointestinal: Positive for nausea and abdominal pain.  Musculoskeletal: Positive for gait problem.  All other systems reviewed and are negative.       Objective:   Physical Exam Constitutional: He is oriented to person, place, and time. He appears well-developed and well-nourished.  HENT:  Head: Normocephalic and atraumatic.  Nose: Nose normal.  Mouth/Throat: Oropharynx is clear  and moist.  Eyes: EOM are normal. Pupils are equal, round, and reactive to light.  Neck: Normal range of motion.  Cardiovascular: Normal rate and regular rhythm.  Pulmonary/Chest: Effort normal and breath sounds normal.  Abdominal: Soft. He exhibits no distension. There is no tenderness.  Musculoskeletal:  Lumbar back: He exhibits decreased range of motion, tenderness, bony tenderness, pain and spasm.  He continues to have significant point tenderness over the greater trochanter on the right. He has tenderness to the distal third of the outer thigh as well. He walks with antalgia on that limb.  Range of motion of right hip : Hip flexion 100, abduction 25 , extension deficit of 10  Right hip strength 5 out of 5  Neurological: He is alert and oriented to person, place, and time.  Psychiatric: He has a normal mood and affect.  His behavior is normal. Judgment and thought content normal.         Assessment & Plan:  1. 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 There aren't a lot of treatment options left for me quite frankly.  2. Meralgia paresthetica, left leg.  3. 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 Right leg length discrepancy.  5. Bilateral hand pain, trigger finger on the left, 5th finger , he states, that he will follow up with Dr. Mina Marble soon. PLAN:  1. Stay with Neurontin to 600 mg t.i.d.  2. Refilled oxycodone IR 10 mg 1 q.6 h. p.r.n. #120. Patient tried oxymorphone 10mg  last month, which did not help at all, he wanted to go back to his former medication Oxymorphone 30mg  # 60. But his insurance does not pay for this anymore, he is now on morphine sulf. ER 60mg  bid.  4. Continue with your walking and exercise program, advised patient to continue walking in the pool of his [employer].  5. Continue with Arnica cream for his mild arthritic pain in his hands bilateral.  Follow up in 1 month.

## 2013-02-12 ENCOUNTER — Encounter
Payer: Medicare Other | Attending: Physical Medicine and Rehabilitation | Admitting: Physical Medicine and Rehabilitation

## 2013-02-12 ENCOUNTER — Encounter: Payer: Self-pay | Admitting: Physical Medicine and Rehabilitation

## 2013-02-12 VITALS — BP 125/57 | HR 72 | Resp 15 | Ht 71.0 in | Wt 191.0 lb

## 2013-02-12 DIAGNOSIS — M47817 Spondylosis without myelopathy or radiculopathy, lumbosacral region: Secondary | ICD-10-CM

## 2013-02-12 DIAGNOSIS — M1610 Unilateral primary osteoarthritis, unspecified hip: Secondary | ICD-10-CM

## 2013-02-12 DIAGNOSIS — IMO0002 Reserved for concepts with insufficient information to code with codable children: Secondary | ICD-10-CM

## 2013-02-12 DIAGNOSIS — M5116 Intervertebral disc disorders with radiculopathy, lumbar region: Secondary | ICD-10-CM

## 2013-02-12 DIAGNOSIS — M79609 Pain in unspecified limb: Secondary | ICD-10-CM | POA: Insufficient documentation

## 2013-02-12 DIAGNOSIS — M5126 Other intervertebral disc displacement, lumbar region: Secondary | ICD-10-CM

## 2013-02-12 DIAGNOSIS — M6530 Trigger finger, unspecified finger: Secondary | ICD-10-CM | POA: Insufficient documentation

## 2013-02-12 DIAGNOSIS — M2170 Unequal limb length (acquired), unspecified site: Secondary | ICD-10-CM

## 2013-02-12 DIAGNOSIS — G8929 Other chronic pain: Secondary | ICD-10-CM

## 2013-02-12 DIAGNOSIS — M5417 Radiculopathy, lumbosacral region: Secondary | ICD-10-CM

## 2013-02-12 DIAGNOSIS — G5710 Meralgia paresthetica, unspecified lower limb: Secondary | ICD-10-CM | POA: Insufficient documentation

## 2013-02-12 DIAGNOSIS — Z96649 Presence of unspecified artificial hip joint: Secondary | ICD-10-CM | POA: Insufficient documentation

## 2013-02-12 DIAGNOSIS — M25559 Pain in unspecified hip: Secondary | ICD-10-CM

## 2013-02-12 MED ORDER — OXYCODONE HCL 10 MG PO TABS: 10.0000 mg | ORAL_TABLET | Freq: Four times a day (QID) | ORAL | Status: DC | PRN

## 2013-02-12 NOTE — Progress Notes (Signed)
Subjective:    Patient ID: Thomas [name], male    DOB: 08/26/1941, 72 y.o.   MRN: [id number]  HPI The patient complains about chronic right hip pain, s/p arthroplasty.  The problem has been stable, the medication controls his pain most of the time. He reports that he did not work out in the pool the last month, because he had hand surgery done.The Patient reports, that he had carpal tunnel release, fith trigger finger and dupuytren surgery on his right hand done, 01/18/13, by Dr. Mina Marble. He is following up with him.  Pain Inventory Average Pain 6 Pain Right Now na My pain is na  In the last 24 hours, has pain interfered with the following? General activity 6 Relation with others 4 Enjoyment of life 6 What TIME of day is your pain at its worst? morning Sleep (in general) Fair  Pain is worse with: walking Pain improves with: rest, heat/ice and medication Relief from Meds: 7  Mobility walk without assistance how many minutes can you walk? 15 ability to climb steps?  yes do you drive?  yes  Function retired  Neuro/Psych trouble walking  Prior Studies Any changes since last visit?  no  Physicians involved in your care Any changes since last visit?  no   Family History  Problem Relation Age of Onset  . Emphysema Mother   . Cancer Father     Brain  . Diabetes Son    History   Social History  . Marital Status: Married    Spouse Name: N/A    Number of Children: N/A  . Years of Education: N/A   Social History Main Topics  . Smoking status: Former Smoker    Types: Cigarettes    Quit date: 10/11/2005  . Smokeless tobacco: Never Used  . Alcohol Use: Yes     Comment: occasionally  . Drug Use: No  . Sexually Active: None   Other Topics Concern  . None   Social History Narrative  . None   Past Surgical History  Procedure Laterality Date  . Shoulder surgery      right  . Carpal tunnel release  2006    right   . Hip fracture surgery  11/2007    right   . Joint replacement  2009    right total hip replacement  . Coronary angioplasty with stent placement     Past Medical History  Diagnosis Date  . Diabetes mellitus   . CAD (coronary artery disease)   . Hypertension   . Leg pain   . Carotid artery occlusion   . Hyperlipidemia   . AAA (abdominal aortic aneurysm)   . 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 Chronic kidney disease     hx kidney stones   BP 125/57  Pulse 72  Resp 15  Ht 5\' 11"  (1.803 m)  Wt 191 lb (86.637 kg)  BMI 26.65 kg/m2  SpO2 97%     Review of Systems  Constitutional: Positive for diaphoresis.  Musculoskeletal: Positive for gait problem.  All other systems reviewed and are negative.       Objective:   Physical Exam Constitutional: He is oriented to person, place, and time. He appears well-developed and well-nourished.  HENT:  Head: Normocephalic and atraumatic.  Nose: Nose normal.  Mouth/Throat: Oropharynx is clear and moist.  Eyes: EOM are normal. Pupils are equal, round, and reactive to light.  Neck: Normal range of motion.  Cardiovascular:  Normal rate and regular rhythm.  Pulmonary/Chest: Effort normal and breath sounds normal.  Abdominal: Soft. He exhibits no distension. There is no tenderness.  Skin: surgical incisions on right hand , healing well Musculoskeletal:  Lumbar back: He exhibits decreased range of motion, tenderness, bony tenderness, pain and spasm.  He continues to have significant point tenderness over the greater trochanter on the right. He has tenderness to the distal third of the outer thigh as well. He walks with antalgia on that limb.  Range of motion of right hip : Hip flexion 100, abduction 25 , extension deficit of 10  Right hip strength 5 out of 5  Neurological: He is alert and oriented to person, place, and time.  Psychiatric: He has a normal mood and affect. His behavior is normal. Judgment and thought content normal.          Assessment & Plan:  1. 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 There aren't a lot of treatment options left for me quite frankly.  2. Meralgia paresthetica, left leg.  3. 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 Right leg length discrepancy.  5. Bilateral hand pain, trigger finger on the left, 5th finger , he states, that he will follow up with Dr. Mina Marble soon. He had carpal tunnel release, fith trigger finger and dupuytren surgery on his right hand done, 01/18/13, by Dr. Mina Marble. He is following up with him. PLAN:  1. Stay with Neurontin to 600 mg t.i.d.  2. Refilled oxycodone IR 10 mg 1 q.6 h. p.r.n. #120. Patient tried oxymorphone 10mg  last month, which did not help at all, he wanted to go back to his former medication Oxymorphone 30mg  # 60. But his insurance does not pay for this anymore, he is now on morphine sulf. ER 60mg  bid. , refilled this medication also. 4. Continue with your walking and exercise program, advised patient to continue walking in the pool of his [employer], when Dr. Mina Marble approves.    Follow up in 1 month.

## 2013-02-12 NOTE — Patient Instructions (Addendum)
Restart with your aquatic exercise program, when Dr. Mina Marble approves.

## 2013-03-14 ENCOUNTER — Encounter
Payer: Medicare Other | Attending: Physical Medicine and Rehabilitation | Admitting: Physical Medicine and Rehabilitation

## 2013-03-14 ENCOUNTER — Encounter: Payer: Self-pay | Admitting: Physical Medicine and Rehabilitation

## 2013-03-14 ENCOUNTER — Other Ambulatory Visit: Payer: Self-pay

## 2013-03-14 VITALS — BP 159/59 | HR 73 | Resp 14 | Ht 71.0 in | Wt 188.0 lb

## 2013-03-14 DIAGNOSIS — M6530 Trigger finger, unspecified finger: Secondary | ICD-10-CM | POA: Insufficient documentation

## 2013-03-14 DIAGNOSIS — E119 Type 2 diabetes mellitus without complications: Secondary | ICD-10-CM | POA: Insufficient documentation

## 2013-03-14 DIAGNOSIS — M2170 Unequal limb length (acquired), unspecified site: Secondary | ICD-10-CM | POA: Insufficient documentation

## 2013-03-14 DIAGNOSIS — M1610 Unilateral primary osteoarthritis, unspecified hip: Secondary | ICD-10-CM

## 2013-03-14 DIAGNOSIS — Z96649 Presence of unspecified artificial hip joint: Secondary | ICD-10-CM | POA: Insufficient documentation

## 2013-03-14 DIAGNOSIS — G8929 Other chronic pain: Secondary | ICD-10-CM

## 2013-03-14 DIAGNOSIS — M5417 Radiculopathy, lumbosacral region: Secondary | ICD-10-CM

## 2013-03-14 DIAGNOSIS — M5126 Other intervertebral disc displacement, lumbar region: Secondary | ICD-10-CM

## 2013-03-14 DIAGNOSIS — G5710 Meralgia paresthetica, unspecified lower limb: Secondary | ICD-10-CM | POA: Insufficient documentation

## 2013-03-14 DIAGNOSIS — M79609 Pain in unspecified limb: Secondary | ICD-10-CM | POA: Insufficient documentation

## 2013-03-14 DIAGNOSIS — M25559 Pain in unspecified hip: Secondary | ICD-10-CM | POA: Insufficient documentation

## 2013-03-14 DIAGNOSIS — I10 Essential (primary) hypertension: Secondary | ICD-10-CM | POA: Insufficient documentation

## 2013-03-14 DIAGNOSIS — M47817 Spondylosis without myelopathy or radiculopathy, lumbosacral region: Secondary | ICD-10-CM

## 2013-03-14 DIAGNOSIS — IMO0002 Reserved for concepts with insufficient information to code with codable children: Secondary | ICD-10-CM

## 2013-03-14 DIAGNOSIS — M5116 Intervertebral disc disorders with radiculopathy, lumbar region: Secondary | ICD-10-CM

## 2013-03-14 DIAGNOSIS — E785 Hyperlipidemia, unspecified: Secondary | ICD-10-CM | POA: Insufficient documentation

## 2013-03-14 MED ORDER — MORPHINE SULFATE ER 60 MG PO TBCR: 60.0000 mg | EXTENDED_RELEASE_TABLET | Freq: Two times a day (BID) | ORAL | Status: DC

## 2013-03-14 MED ORDER — GABAPENTIN 300 MG PO CAPS: 300.0000 mg | ORAL_CAPSULE | Freq: Three times a day (TID) | ORAL | Status: DC

## 2013-03-14 MED ORDER — OXYCODONE HCL 10 MG PO TABS: 10.0000 mg | ORAL_TABLET | Freq: Four times a day (QID) | ORAL | Status: DC | PRN

## 2013-03-14 NOTE — Telephone Encounter (Signed)
Refill request for gabapentin 300mg  tid.  Patient says he has cut back from 600mg .  He will discuss this further at his appointment today.

## 2013-03-14 NOTE — Patient Instructions (Signed)
Continue with your exercise program as tolerated 

## 2013-03-14 NOTE — Progress Notes (Signed)
Subjective:    Patient ID: Thomas [name], male    DOB: 1941-01-03, 72 y.o.   MRN: [id number]  HPI The patient complains about chronic right hip pain, s/p arthroplasty.  The problem has been stable, the medication controls his pain most of the time. He reports that he did not work out in the pool the last month, because he had hand surgery done.The Patient reports, that he had carpal tunnel release, fith trigger finger and dupuytren surgery on his right hand done, 01/18/13, by Dr. Mina Marble. He is following up with him.  Pain Inventory Average Pain 6 Pain Right Now 5 My pain is constant  In the last 24 hours, has pain interfered with the following? General activity 6 Relation with others 4 Enjoyment of life 7 What TIME of day is your pain at its worst? morning and day Sleep (in general) Fair  Pain is worse with: walking Pain improves with: medication Relief from Meds: 7  Mobility walk without assistance how many minutes can you walk? 15 ability to climb steps?  yes do you drive?  yes Do you have any goals in this area?  no  Function retired  Neuro/Psych tingling trouble walking  Prior Studies Any changes since last visit?  no bone scan CT/MRI  Physicians involved in your care Dr Clovis Riley, Dr Charlann Boxer   Family History  Problem Relation Age of Onset  . Emphysema Mother   . Cancer Father     Brain  . Diabetes Son    History   Social History  . Marital Status: Married    Spouse Name: N/A    Number of Children: N/A  . Years of Education: N/A   Social History Main Topics  . Smoking status: Former Smoker    Types: Cigarettes    Quit date: 10/11/2005  . Smokeless tobacco: Never Used  . Alcohol Use: Yes     Comment: occasionally  . Drug Use: No  . Sexually Active: None   Other Topics Concern  . None   Social History Narrative  . None   Past Surgical History  Procedure Laterality Date  . Shoulder surgery      right  . Carpal tunnel release  2006   right   . Hip fracture surgery  11/2007    right  . Joint replacement  2009    right total hip replacement  . Coronary angioplasty with stent placement     Past Medical History  Diagnosis Date  . Diabetes mellitus   . CAD (coronary artery disease)   . Hypertension   . Leg pain   . Carotid artery occlusion   . Hyperlipidemia   . AAA (abdominal aortic aneurysm)   . 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 Chronic kidney disease     hx kidney stones   BP 159/59  Pulse 73  Resp 14  Ht 5\' 11"  (1.803 m)  Wt 188 lb (85.276 kg)  BMI 26.23 kg/m2  SpO2 98%     Review of Systems  Constitutional: Positive for diaphoresis, appetite change and unexpected weight change.  Musculoskeletal: Positive for gait problem.  All other systems reviewed and are negative.       Objective:   Physical Exam Constitutional: He is oriented to person, place, and time. He appears well-developed and well-nourished.  HENT:  Head: Normocephalic and atraumatic.  Nose: Nose normal.  Mouth/Throat: Oropharynx is clear and moist.  Eyes: EOM are normal. Pupils are  equal, round, and reactive to light.  Neck: Normal range of motion.  Cardiovascular: Normal rate and regular rhythm.  Pulmonary/Chest: Effort normal and breath sounds normal.  Abdominal: Soft. He exhibits no distension. There is no tenderness.  Skin: surgical incisions on right hand , healing well Musculoskeletal:  Lumbar back: He exhibits decreased range of motion, tenderness, bony tenderness, pain and spasm.  He continues to have tenderness over the greater trochanter on the right. He has tenderness to the distal third of the outer thigh as well. He walks with antalgia on that limb.  Range of motion of right hip : Hip flexion 100, abduction 25 , extension deficit of 10  Right hip strength 5 out of 5  Neurological: He is alert and oriented to person, place, and time.  Psychiatric: He has a normal mood and  affect. His behavior is normal. Judgment and thought content normal.         Assessment & Plan:  1. 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 There aren't a lot of treatment options left for me quite frankly.  2. Meralgia paresthetica, left leg.  3. 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 Right leg length discrepancy.  5. Bilateral hand pain, trigger finger on the left, 5th finger , he states, that he will follow up with Dr. Mina Marble soon. He had carpal tunnel release, fith trigger finger and dupuytren surgery on his right hand done, 01/18/13, by Dr. Mina Marble. He is following up with him.  PLAN:  1. Stay with Neurontin to 300 mg t.i.d.  2. Refilled oxycodone IR 10 mg 1 q.6 h. p.r.n. #120. Patient tried oxymorphone 10mg  last month, which did not help at all, he wanted to go back to his former medication Oxymorphone 30mg  # 60. But his insurance does not pay for this anymore, he is now on morphine sulf. ER 60mg  bid. , refilled this medication also.  4. Continue with your walking and exercise program, advised patient to continue walking in the pool of his [employer], when Dr. Mina Marble approves.

## 2013-04-10 ENCOUNTER — Encounter: Payer: Self-pay | Admitting: Physical Medicine and Rehabilitation

## 2013-04-10 ENCOUNTER — Other Ambulatory Visit: Payer: Self-pay

## 2013-04-10 ENCOUNTER — Encounter
Payer: Medicare Other | Attending: Physical Medicine and Rehabilitation | Admitting: Physical Medicine and Rehabilitation

## 2013-04-10 VITALS — BP 150/73 | HR 80 | Resp 14 | Ht 71.0 in | Wt 188.0 lb

## 2013-04-10 DIAGNOSIS — I2510 Atherosclerotic heart disease of native coronary artery without angina pectoris: Secondary | ICD-10-CM | POA: Insufficient documentation

## 2013-04-10 DIAGNOSIS — Z96649 Presence of unspecified artificial hip joint: Secondary | ICD-10-CM | POA: Insufficient documentation

## 2013-04-10 DIAGNOSIS — M5116 Intervertebral disc disorders with radiculopathy, lumbar region: Secondary | ICD-10-CM

## 2013-04-10 DIAGNOSIS — M2170 Unequal limb length (acquired), unspecified site: Secondary | ICD-10-CM | POA: Insufficient documentation

## 2013-04-10 DIAGNOSIS — M25559 Pain in unspecified hip: Secondary | ICD-10-CM | POA: Insufficient documentation

## 2013-04-10 DIAGNOSIS — G5710 Meralgia paresthetica, unspecified lower limb: Secondary | ICD-10-CM | POA: Insufficient documentation

## 2013-04-10 DIAGNOSIS — E785 Hyperlipidemia, unspecified: Secondary | ICD-10-CM | POA: Insufficient documentation

## 2013-04-10 DIAGNOSIS — G8929 Other chronic pain: Secondary | ICD-10-CM | POA: Insufficient documentation

## 2013-04-10 DIAGNOSIS — E119 Type 2 diabetes mellitus without complications: Secondary | ICD-10-CM | POA: Insufficient documentation

## 2013-04-10 DIAGNOSIS — M5417 Radiculopathy, lumbosacral region: Secondary | ICD-10-CM

## 2013-04-10 DIAGNOSIS — M6530 Trigger finger, unspecified finger: Secondary | ICD-10-CM | POA: Insufficient documentation

## 2013-04-10 DIAGNOSIS — M5126 Other intervertebral disc displacement, lumbar region: Secondary | ICD-10-CM

## 2013-04-10 DIAGNOSIS — M79609 Pain in unspecified limb: Secondary | ICD-10-CM | POA: Insufficient documentation

## 2013-04-10 DIAGNOSIS — M1610 Unilateral primary osteoarthritis, unspecified hip: Secondary | ICD-10-CM

## 2013-04-10 DIAGNOSIS — IMO0002 Reserved for concepts with insufficient information to code with codable children: Secondary | ICD-10-CM

## 2013-04-10 MED ORDER — GABAPENTIN 300 MG PO CAPS: 300.0000 mg | ORAL_CAPSULE | Freq: Three times a day (TID) | ORAL | Status: DC

## 2013-04-10 MED ORDER — MELOXICAM 15 MG PO TABS: 15.0000 mg | ORAL_TABLET | Freq: Every day | ORAL | Status: DC

## 2013-04-10 MED ORDER — OXYCODONE HCL 10 MG PO TABS: 10.0000 mg | ORAL_TABLET | Freq: Four times a day (QID) | ORAL | Status: DC | PRN

## 2013-04-10 NOTE — Patient Instructions (Signed)
Try to stay as active as tolerated 

## 2013-04-10 NOTE — Progress Notes (Signed)
Subjective:    Patient ID: Thomas [name], male    DOB: 1941/03/09, 72 y.o.   MRN: [id number]  HPI The patient complains about chronic right hip pain, s/p arthroplasty.  The problem has been stable, the medication controls his pain most of the time. He reports that he did not work out in the pool the last month, because he had hand surgery done.The Patient reports, that he had carpal tunnel release, fith trigger finger and dupuytren surgery on his right hand done, 01/18/13, by Dr. Mina Marble. He is following up with him. Exacerbation of his hip pain after staying on the beach for one week, walking in the sand and swimming/standing in the waves.  Pain Inventory Average Pain 7 Pain Right Now 6 My pain is sharp, stabbing and aching  In the last 24 hours, has pain interfered with the following? General activity 7 Relation with others 5 Enjoyment of life 7 What TIME of day is your pain at its worst? constant Sleep (in general) Fair  Pain is worse with: walking Pain improves with: rest, therapy/exercise and medication Relief from Meds: 6  Mobility walk without assistance how many minutes can you walk? 15 ability to climb steps?  yes do you drive?  yes Do you have any goals in this area?  no  Function retired Do you have any goals in this area?  no  Neuro/Psych trouble walking  Prior Studies Any changes since last visit?  no  Physicians involved in your care Any changes since last visit?  no   Family History  Problem Relation Age of Onset  . Emphysema Mother   . Cancer Father     Brain  . Diabetes Son    History   Social History  . Marital Status: Married    Spouse Name: N/A    Number of Children: N/A  . Years of Education: N/A   Social History Main Topics  . Smoking status: Former Smoker    Types: Cigarettes    Quit date: 10/11/2005  . Smokeless tobacco: Never Used  . Alcohol Use: Yes     Comment: occasionally  . Drug Use: No  . Sexually Active: None    Other Topics Concern  . None   Social History Narrative  . None   Past Surgical History  Procedure Laterality Date  . Shoulder surgery      right  . Carpal tunnel release  2006    right   . Hip fracture surgery  11/2007    right  . Joint replacement  2009    right total hip replacement  . Coronary angioplasty with stent placement     Past Medical History  Diagnosis Date  . Diabetes mellitus   . CAD (coronary artery disease)   . Hypertension   . Leg pain   . Carotid artery occlusion   . Hyperlipidemia   . AAA (abdominal aortic aneurysm)   . 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 Chronic kidney disease     hx kidney stones   BP 150/73  Pulse 80  Resp 14  Ht 5\' 11"  (1.803 m)  Wt 188 lb (85.276 kg)  BMI 26.23 kg/m2  SpO2 96%     Review of Systems  Constitutional: Positive for diaphoresis and appetite change.  Musculoskeletal: Positive for gait problem.  All other systems reviewed and are negative.       Objective:   Physical Exam Constitutional: He is oriented to  person, place, and time. He appears well-developed and well-nourished.  HENT:  Head: Normocephalic and atraumatic.  Nose: Nose normal.  Mouth/Throat: Oropharynx is clear and moist.  Eyes: EOM are normal. Pupils are equal, round, and reactive to light.  Neck: Normal range of motion.  Cardiovascular: Normal rate and regular rhythm.  Pulmonary/Chest: Effort normal and breath sounds normal.  Abdominal: Soft. He exhibits no distension. There is no tenderness.  Skin: surgical incisions on right hand , healing well Musculoskeletal:  Lumbar back: He exhibits decreased range of motion, tenderness, bony tenderness, pain and spasm.  He continues to have tenderness over the greater trochanter on the right. He has tenderness to the distal third of the outer thigh as well. He walks with antalgia on that limb.  Range of motion of right hip : Hip flexion 100, abduction 25 ,  extension deficit of 10  Right hip strength 5 out of 5  Neurological: He is alert and oriented to person, place, and time.  Psychiatric: He has a normal mood and affect. His behavior is normal. Judgment and thought content normal.         Assessment & Plan:  1. 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 Exacerbation of pain, after staying on the beach for a week, prescribed Mobic 15mg  to take foe about a week 1 tablet a day, after this prn. Also considered steroid dose pack, but patient had a bad experience with this medication, he also has DM. 2. Meralgia paresthetica, left leg.  3. 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 Right leg length discrepancy.  5. Bilateral hand pain, trigger finger on the left, 5th finger , he states, that he will follow up with Dr. Mina Marble soon. He had carpal tunnel release, fith trigger finger and dupuytren surgery on his right hand done, 01/18/13, by Dr. Mina Marble. He is following up with him.  PLAN:  1. Stay with Neurontin to 300 mg t.i.d.  2. Refilled oxycodone IR 10 mg 1 q.6 h. p.r.n. #120. Patient tried oxymorphone 10mg  last month, which did not help at all, he wanted to go back to his former medication Oxymorphone 30mg  # 60. But his insurance does not pay for this anymore, he is now on morphine sulf. ER 60mg  bid. , refilled this medication also.  4. Continue with your walking and exercise program, advised patient to continue walking in the pool of his [employer], when Dr. Mina Marble approves.

## 2013-04-30 ENCOUNTER — Encounter
Payer: Medicare Other | Attending: Physical Medicine and Rehabilitation | Admitting: Physical Medicine and Rehabilitation

## 2013-04-30 ENCOUNTER — Encounter: Payer: Self-pay | Admitting: Physical Medicine and Rehabilitation

## 2013-04-30 VITALS — BP 119/60 | HR 68 | Resp 14 | Ht 71.0 in | Wt 190.0 lb

## 2013-04-30 DIAGNOSIS — M1610 Unilateral primary osteoarthritis, unspecified hip: Secondary | ICD-10-CM

## 2013-04-30 DIAGNOSIS — Z96649 Presence of unspecified artificial hip joint: Secondary | ICD-10-CM | POA: Insufficient documentation

## 2013-04-30 DIAGNOSIS — M2170 Unequal limb length (acquired), unspecified site: Secondary | ICD-10-CM | POA: Insufficient documentation

## 2013-04-30 DIAGNOSIS — M5116 Intervertebral disc disorders with radiculopathy, lumbar region: Secondary | ICD-10-CM

## 2013-04-30 DIAGNOSIS — M6530 Trigger finger, unspecified finger: Secondary | ICD-10-CM | POA: Insufficient documentation

## 2013-04-30 DIAGNOSIS — I10 Essential (primary) hypertension: Secondary | ICD-10-CM | POA: Insufficient documentation

## 2013-04-30 DIAGNOSIS — G8929 Other chronic pain: Secondary | ICD-10-CM | POA: Insufficient documentation

## 2013-04-30 DIAGNOSIS — M25551 Pain in right hip: Secondary | ICD-10-CM

## 2013-04-30 DIAGNOSIS — M76899 Other specified enthesopathies of unspecified lower limb, excluding foot: Secondary | ICD-10-CM | POA: Insufficient documentation

## 2013-04-30 DIAGNOSIS — M7061 Trochanteric bursitis, right hip: Secondary | ICD-10-CM

## 2013-04-30 DIAGNOSIS — M25559 Pain in unspecified hip: Secondary | ICD-10-CM | POA: Insufficient documentation

## 2013-04-30 DIAGNOSIS — M5126 Other intervertebral disc displacement, lumbar region: Secondary | ICD-10-CM

## 2013-04-30 DIAGNOSIS — E785 Hyperlipidemia, unspecified: Secondary | ICD-10-CM | POA: Insufficient documentation

## 2013-04-30 DIAGNOSIS — IMO0002 Reserved for concepts with insufficient information to code with codable children: Secondary | ICD-10-CM

## 2013-04-30 DIAGNOSIS — I2510 Atherosclerotic heart disease of native coronary artery without angina pectoris: Secondary | ICD-10-CM | POA: Insufficient documentation

## 2013-04-30 DIAGNOSIS — E119 Type 2 diabetes mellitus without complications: Secondary | ICD-10-CM | POA: Insufficient documentation

## 2013-04-30 DIAGNOSIS — M5417 Radiculopathy, lumbosacral region: Secondary | ICD-10-CM

## 2013-04-30 DIAGNOSIS — M79609 Pain in unspecified limb: Secondary | ICD-10-CM | POA: Insufficient documentation

## 2013-04-30 MED ORDER — DICLOFENAC SODIUM 1 % TD GEL: 2.0000 g | Freq: Four times a day (QID) | TRANSDERMAL | Status: DC

## 2013-04-30 MED ORDER — OXYCODONE HCL 10 MG PO TABS: 10.0000 mg | ORAL_TABLET | Freq: Four times a day (QID) | ORAL | Status: DC | PRN

## 2013-04-30 NOTE — Progress Notes (Signed)
Subjective:    Patient ID: Thomas [name], male    DOB: 11/28/1940, 72 y.o.   MRN: [id number]  HPI The patient complains about chronic right hip pain, s/p arthroplasty.  The problem has been stable, the medication controls his pain most of the time. He reports that he did not work out in the pool the last month, because he had hand surgery done.The Patient reports, that he had carpal tunnel release, fith trigger finger and dupuytren surgery on his right hand done, 01/18/13, by Dr. Mina Marble. He is following up with him.  Exacerbation of his hip pain after he ran into a corner and hit his right hip. He complains about severe pain on the lateral side of his right hip, which gets worse when he puts weight on, and gets alleviated when he rests.  Pain Inventory Average Pain 7 Pain Right Now 7 My pain is constant  In the last 24 hours, has pain interfered with the following? General activity 7 Relation with others 5 Enjoyment of life 7 What TIME of day is your pain at its worst? morning and evening Sleep (in general) Fair  Pain is worse with: walking Pain improves with: rest and medication Relief from Meds: 4  Mobility walk without assistance how many minutes can you walk? 15 ability to climb steps?  yes do you drive?  yes Do you have any goals in this area?  no  Function retired  Neuro/Psych trouble walking  Prior Studies Any changes since last visit?  no  Physicians involved in your care Any changes since last visit?  no   Family History  Problem Relation Age of Onset  . Emphysema Mother   . Cancer Father     Brain  . Diabetes Son    History   Social History  . Marital Status: Married    Spouse Name: N/A    Number of Children: N/A  . Years of Education: N/A   Social History Main Topics  . Smoking status: Former Smoker    Types: Cigarettes    Quit date: 10/11/2005  . Smokeless tobacco: Never Used  . Alcohol Use: Yes     Comment: occasionally  . Drug Use:  No  . Sexually Active: None   Other Topics Concern  . None   Social History Narrative  . None   Past Surgical History  Procedure Laterality Date  . Shoulder surgery      right  . Carpal tunnel release  2006    right   . Hip fracture surgery  11/2007    right  . Joint replacement  2009    right total hip replacement  . Coronary angioplasty with stent placement     Past Medical History  Diagnosis Date  . Diabetes mellitus   . CAD (coronary artery disease)   . Hypertension   . Leg pain   . Carotid artery occlusion   . Hyperlipidemia   . AAA (abdominal aortic aneurysm)   . 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 Chronic kidney disease     hx kidney stones   BP 119/60  Pulse 68  Resp 14  Ht 5\' 11"  (1.803 m)  Wt 190 lb (86.183 kg)  BMI 26.51 kg/m2  SpO2 97%     Review of Systems  Constitutional: Positive for diaphoresis.  Musculoskeletal: Positive for gait problem.  All other systems reviewed and are negative.       Objective:  Physical Exam Constitutional: He is oriented to person, place, and time. He appears well-developed and well-nourished.  HENT:  Head: Normocephalic and atraumatic.  Nose: Nose normal.  Mouth/Throat: Oropharynx is clear and moist.  Eyes: EOM are normal. Pupils are equal, round, and reactive to light.  Neck: Normal range of motion.  Cardiovascular: Normal rate and regular rhythm.  Pulmonary/Chest: Effort normal and breath sounds normal.  Abdominal: Soft. He exhibits no distension. There is no tenderness.  Skin: surgical incisions on right hand , healing well Musculoskeletal:  Lumbar back: He exhibits decreased range of motion, tenderness, bony tenderness, pain and spasm.  He has extreme tenderness over the greater trochanter on the right. He walks with antalgia on that limb.  Range of motion of right hip : Hip flexion 100, abduction 25 , extension deficit of 10  Right hip strength 5 out of 5   Neurological: He is alert and oriented to person, place, and time.  Psychiatric: He has a normal mood and affect. His behavior is normal. Judgment and thought content normal        Assessment & Plan:  1. 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 Exacerbation of pain, after staying on the beach for a week, prescribed Mobic 15mg  to take foe about a week 1 tablet a day, after this prn. Patient could not tolerate this medication, it made him nauseated.Exacerbation today, after hitting the right hip on a corner, severe pain with weightbearing. Ordered X-rays of right hip, to control position of hardware.  2. Trochanteric bursitis, most likely aggrevated by trauma to bursa. Steroid injection given after patient signed consent. Patient tolerated procedure well, post injection instructions were given. Prescribed Voltaren gel . 3. 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 Right leg length discrepancy.  5. Bilateral hand pain, trigger finger on the left, 5th finger , he states, that he will follow up with Dr. Mina Marble soon. He had carpal tunnel release, fith trigger finger and dupuytren surgery on his right hand done, 01/18/13, by Dr. Mina Marble. He is following up with him.  PLAN:  1. Stay with Neurontin to 300 mg t.i.d.  2. Refilled oxycodone IR 10 mg 1 q.6 h. p.r.n. #120. Patient tried oxymorphone 10mg  last month, which did not help at all, he wanted to go back to his former medication Oxymorphone 30mg  # 60. But his insurance does not pay for this anymore, he is now on morphine sulf. ER 60mg  bid. , refilled this medication also.  4. Continue with your walking and exercise program, advised patient to continue walking in the pool of his [employer], when pain allows, and x-rays are without significant findings.

## 2013-04-30 NOTE — Patient Instructions (Signed)
Apply ice , or the Voltaren gel to your right hip

## 2013-05-02 ENCOUNTER — Ambulatory Visit
Admission: RE | Admit: 2013-05-02 | Discharge: 2013-05-02 | Disposition: A | Discharge status: Home / Self Care | Payer: Medicare Other | Source: Ambulatory Visit | Attending: Physical Medicine and Rehabilitation | Admitting: Physical Medicine and Rehabilitation

## 2013-05-02 DIAGNOSIS — M25551 Pain in right hip: Secondary | ICD-10-CM

## 2013-05-02 DIAGNOSIS — Z96649 Presence of unspecified artificial hip joint: Secondary | ICD-10-CM

## 2013-05-03 ENCOUNTER — Telehealth: Payer: Self-pay

## 2013-05-03 NOTE — Telephone Encounter (Signed)
Message copied by Judd Gaudier on Thu May 03, 2013  3:34 PM ------      Message from: Su Monks      Created: Thu May 03, 2013  9:22 AM       Please inform patient, x-rays did not show new findings, he already had the loosening before.             Findings: Status post right total hip arthroplasty.  No fracture or      dislocation is noted.  Acetabular component appears to be well      situated.  Lucency is noted around the inferior tip of the femoral      prosthesis which may simply represent standard postoperative      finding, but some degree of loosening cannot be excluded.         ------

## 2013-05-03 NOTE — Telephone Encounter (Signed)
Patient informed of x-ray results. 

## 2013-05-09 ENCOUNTER — Ambulatory Visit: Payer: Medicare Other | Admitting: Physical Medicine and Rehabilitation

## 2013-05-16 ENCOUNTER — Other Ambulatory Visit: Payer: Self-pay

## 2013-06-07 ENCOUNTER — Encounter
Payer: Medicare Other | Attending: Physical Medicine and Rehabilitation | Admitting: Physical Medicine and Rehabilitation

## 2013-06-07 ENCOUNTER — Encounter: Payer: Self-pay | Admitting: Physical Medicine and Rehabilitation

## 2013-06-07 VITALS — BP 149/71 | HR 75 | Resp 14 | Ht 71.0 in | Wt 190.0 lb

## 2013-06-07 DIAGNOSIS — E785 Hyperlipidemia, unspecified: Secondary | ICD-10-CM | POA: Insufficient documentation

## 2013-06-07 DIAGNOSIS — M76899 Other specified enthesopathies of unspecified lower limb, excluding foot: Secondary | ICD-10-CM | POA: Insufficient documentation

## 2013-06-07 DIAGNOSIS — Z79899 Other long term (current) drug therapy: Secondary | ICD-10-CM

## 2013-06-07 DIAGNOSIS — I10 Essential (primary) hypertension: Secondary | ICD-10-CM | POA: Insufficient documentation

## 2013-06-07 DIAGNOSIS — M5126 Other intervertebral disc displacement, lumbar region: Secondary | ICD-10-CM

## 2013-06-07 DIAGNOSIS — IMO0002 Reserved for concepts with insufficient information to code with codable children: Secondary | ICD-10-CM

## 2013-06-07 DIAGNOSIS — I2510 Atherosclerotic heart disease of native coronary artery without angina pectoris: Secondary | ICD-10-CM | POA: Insufficient documentation

## 2013-06-07 DIAGNOSIS — M6530 Trigger finger, unspecified finger: Secondary | ICD-10-CM | POA: Insufficient documentation

## 2013-06-07 DIAGNOSIS — M5417 Radiculopathy, lumbosacral region: Secondary | ICD-10-CM

## 2013-06-07 DIAGNOSIS — M2170 Unequal limb length (acquired), unspecified site: Secondary | ICD-10-CM | POA: Insufficient documentation

## 2013-06-07 DIAGNOSIS — G8929 Other chronic pain: Secondary | ICD-10-CM | POA: Insufficient documentation

## 2013-06-07 DIAGNOSIS — M5116 Intervertebral disc disorders with radiculopathy, lumbar region: Secondary | ICD-10-CM

## 2013-06-07 DIAGNOSIS — Z96649 Presence of unspecified artificial hip joint: Secondary | ICD-10-CM

## 2013-06-07 DIAGNOSIS — M21869 Other specified acquired deformities of unspecified lower leg: Secondary | ICD-10-CM

## 2013-06-07 DIAGNOSIS — M1610 Unilateral primary osteoarthritis, unspecified hip: Secondary | ICD-10-CM

## 2013-06-07 DIAGNOSIS — E119 Type 2 diabetes mellitus without complications: Secondary | ICD-10-CM | POA: Insufficient documentation

## 2013-06-07 DIAGNOSIS — Z5181 Encounter for therapeutic drug level monitoring: Secondary | ICD-10-CM

## 2013-06-07 DIAGNOSIS — M79609 Pain in unspecified limb: Secondary | ICD-10-CM | POA: Insufficient documentation

## 2013-06-07 DIAGNOSIS — M25559 Pain in unspecified hip: Secondary | ICD-10-CM | POA: Insufficient documentation

## 2013-06-07 MED ORDER — OXYCODONE HCL 10 MG PO TABS: 10.0000 mg | ORAL_TABLET | Freq: Four times a day (QID) | ORAL | Status: DC | PRN

## 2013-06-07 NOTE — Patient Instructions (Signed)
Continue with your aquatic exercising.

## 2013-06-07 NOTE — Progress Notes (Signed)
Subjective:    Patient ID: Thomas [name], male    DOB: 09-12-1941, 72 y.o.   MRN: [id number]  HPI The patient complains about chronic right hip pain, s/p arthroplasty.  The problem has been stable, the medication controls his pain most of the time. He reports that he did not work out in the pool the last month, because he had hand surgery done.The Patient reports, that he had carpal tunnel release, fith trigger finger and dupuytren surgery on his right hand done, 01/18/13, by Dr. Mina Marble. He is following up with him.  The patient will follow up with Dr. Charlann Boxer, his orthopedic surgeon, for his chronic hip and back pain s/p right hip arthroplasty .   Pain Inventory Average Pain 6 Pain Right Now 6 My pain is sharp, dull and aching  In the last 24 hours, has pain interfered with the following? General activity 7 Relation with others 6 Enjoyment of life 6 What TIME of day is your pain at its worst? evening Sleep (in general) Fair  Pain is worse with: walking Pain improves with: rest and medication Relief from Meds: 4  Mobility walk without assistance how many minutes can you walk? 15 ability to climb steps?  yes do you drive?  yes Do you have any goals in this area?  no  Function retired Do you have any goals in this area?  no  Neuro/Psych trouble walking  Prior Studies Any changes since last visit?  no  Physicians involved in your care Any changes since last visit?  no   Family History  Problem Relation Age of Onset  . Emphysema Mother   . Cancer Father     Brain  . Diabetes Son    History   Social History  . Marital Status: Married    Spouse Name: N/A    Number of Children: N/A  . Years of Education: N/A   Social History Main Topics  . Smoking status: Former Smoker    Types: Cigarettes    Quit date: 10/11/2005  . Smokeless tobacco: Never Used  . Alcohol Use: Yes     Comment: occasionally  . Drug Use: No  . Sexual Activity: None   Other Topics  Concern  . None   Social History Narrative  . None   Past Surgical History  Procedure Laterality Date  . Shoulder surgery      right  . Carpal tunnel release  2006    right   . Hip fracture surgery  11/2007    right  . Joint replacement  2009    right total hip replacement  . Coronary angioplasty with stent placement     Past Medical History  Diagnosis Date  . Diabetes mellitus   . CAD (coronary artery disease)   . Hypertension   . Leg pain   . Carotid artery occlusion   . Hyperlipidemia   . AAA (abdominal aortic aneurysm)   . 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 Chronic kidney disease     hx kidney stones   BP 149/71  Pulse 75  Resp 14  Ht 5\' 11"  (1.803 m)  Wt 190 lb (86.183 kg)  BMI 26.51 kg/m2  SpO2 97%     Review of Systems  Constitutional: Positive for diaphoresis.  Musculoskeletal: Positive for gait problem.  All other systems reviewed and are negative.       Objective:   Physical Exam Constitutional: He is oriented to person,  place, and time. He appears well-developed and well-nourished.  HENT:  Head: Normocephalic and atraumatic.  Nose: Nose normal.  Mouth/Throat: Oropharynx is clear and moist.  Eyes: EOM are normal. Pupils are equal, round, and reactive to light.  Neck: Normal range of motion.  Cardiovascular: Normal rate and regular rhythm.  Pulmonary/Chest: Effort normal and breath sounds normal.  Abdominal: Soft. He exhibits no distension. There is no tenderness.  Skin: surgical incisions on right hand , healing well Musculoskeletal:  Lumbar back: He exhibits decreased range of motion, tenderness, bony tenderness, pain and spasm.  He has extreme tenderness over the greater trochanter on the right. He walks with antalgia on that limb.  Range of motion of right hip : Hip flexion 100, abduction 25 , extension deficit of 10  Right hip strength 5 out of 5  Neurological: He is alert and oriented to person,  place, and time.  Psychiatric: He has a normal mood and affect. His behavior is normal. Judgment and thought content normal        Assessment & Plan:  1. 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 Exacerbation of pain, after staying on the beach for a week, prescribed Mobic 15mg  to take foe about a week 1 tablet a day, after this prn. Patient could not tolerate this medication, it made him nauseated. Ordered X-rays of right hip,at his last visit, after he bumped into a corner, to control position of hardware. X-ray did not show any new findings, hardware in place and intact. 2. Trochanteric bursitis, most likely aggrevated by trauma to bursa. Steroid injection given at last visit, gave him only relief for several days. He will follow up with his orthopedic surgeon in September, who ordered a ESI when he last had this exacerbation of back and hip pain, the ESI had given him relief for a longer time.He should also continue with the Voltaren gel .  3. 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 Right leg length discrepancy.  5. Bilateral hand pain, trigger finger on the left, 5th finger . He had carpal tunnel release, fith trigger finger and dupuytren surgery on his right hand done, 01/18/13, by Dr. Mina Marble. He is following up with him.  PLAN:  1. Stay with Neurontin to 300 mg t.i.d.  2. Refilled oxycodone IR 10 mg 1 q.6 h. p.r.n. #120. Patient tried oxymorphone 10mg  in the past, which did not help at all, he wanted to go back to his former medication Oxymorphone 30mg  # 60. But his insurance does not pay for this anymore, he is now on morphine sulf. ER 60mg  bid. , refilled this medication also.  4. Continue with your walking and exercise program, advised patient to continue walking in the pool of his [employer], when pain allows, and x-rays are without significant findings.

## 2013-07-03 ENCOUNTER — Encounter: Payer: Self-pay | Admitting: Physical Medicine and Rehabilitation

## 2013-07-03 ENCOUNTER — Encounter
Payer: Medicare Other | Attending: Physical Medicine and Rehabilitation | Admitting: Physical Medicine and Rehabilitation

## 2013-07-03 VITALS — BP 117/55 | HR 76 | Resp 14 | Ht 71.0 in | Wt 197.0 lb

## 2013-07-03 DIAGNOSIS — T8489XA Other specified complication of internal orthopedic prosthetic devices, implants and grafts, initial encounter: Secondary | ICD-10-CM | POA: Insufficient documentation

## 2013-07-03 DIAGNOSIS — Z96649 Presence of unspecified artificial hip joint: Secondary | ICD-10-CM | POA: Insufficient documentation

## 2013-07-03 DIAGNOSIS — Z79899 Other long term (current) drug therapy: Secondary | ICD-10-CM | POA: Insufficient documentation

## 2013-07-03 DIAGNOSIS — M25559 Pain in unspecified hip: Secondary | ICD-10-CM | POA: Insufficient documentation

## 2013-07-03 DIAGNOSIS — M76899 Other specified enthesopathies of unspecified lower limb, excluding foot: Secondary | ICD-10-CM

## 2013-07-03 DIAGNOSIS — M6530 Trigger finger, unspecified finger: Secondary | ICD-10-CM | POA: Insufficient documentation

## 2013-07-03 DIAGNOSIS — IMO0002 Reserved for concepts with insufficient information to code with codable children: Secondary | ICD-10-CM

## 2013-07-03 DIAGNOSIS — M2170 Unequal limb length (acquired), unspecified site: Secondary | ICD-10-CM

## 2013-07-03 DIAGNOSIS — G8929 Other chronic pain: Secondary | ICD-10-CM | POA: Insufficient documentation

## 2013-07-03 DIAGNOSIS — M1610 Unilateral primary osteoarthritis, unspecified hip: Secondary | ICD-10-CM

## 2013-07-03 DIAGNOSIS — M5116 Intervertebral disc disorders with radiculopathy, lumbar region: Secondary | ICD-10-CM

## 2013-07-03 DIAGNOSIS — M5417 Radiculopathy, lumbosacral region: Secondary | ICD-10-CM

## 2013-07-03 DIAGNOSIS — M5126 Other intervertebral disc displacement, lumbar region: Secondary | ICD-10-CM

## 2013-07-03 MED ORDER — MORPHINE SULFATE ER 60 MG PO TBCR: 60.0000 mg | EXTENDED_RELEASE_TABLET | Freq: Two times a day (BID) | ORAL | Status: DC

## 2013-07-03 MED ORDER — OXYCODONE HCL 10 MG PO TABS: 10.0000 mg | ORAL_TABLET | Freq: Four times a day (QID) | ORAL | Status: DC | PRN

## 2013-07-03 NOTE — Patient Instructions (Signed)
Continue with exercising in the pool

## 2013-07-03 NOTE — Progress Notes (Signed)
Subjective:    Patient ID: Thomas [name], male    DOB: 26-May-1941, 72 y.o.   MRN: [id number]  HPI The patient complains about chronic right hip pain, s/p arthroplasty.  The problem has been stable, the medication controls his pain most of the time. He reports that he did not work out in the pool the last month, because he had hand surgery done.The Patient reports, that he had carpal tunnel release, fith trigger finger and dupuytren surgery on his right hand done, 01/18/13, by Dr. Mina Marble. He is following up with him.  The patient will follow up with Dr. Charlann Boxer, his orthopedic surgeon, for his chronic hip and back pain s/p right hip arthroplasty .He ordered an ESI , which the patient received on September the 12, he states, that he got 50-60 % relief from this, he wanted to receive this injection in the same office where he had it done before.  Pain Inventory Average Pain 6 Pain Right Now 6 My pain is dull and aching  In the last 24 hours, has pain interfered with the following? General activity 5 Relation with others 5 Enjoyment of life 6 What TIME of day is your pain at its worst? morning and evening Sleep (in general) Fair  Pain is worse with: walking and standing Pain improves with: rest, heat/ice and medication Relief from Meds: 8  Mobility how many minutes can you walk? 15 Do you have any goals in this area?  no  Function retired I need assistance with the following:  bathing Do you have any goals in this area?  no  Neuro/Psych bladder control problems trouble walking confusion  Prior Studies Any changes since last visit?  no  Physicians involved in your care Any changes since last visit?  no   Family History  Problem Relation Age of Onset  . Emphysema Mother   . Cancer Father     Brain  . Diabetes Son    History   Social History  . Marital Status: Married    Spouse Name: N/A    Number of Children: N/A  . Years of Education: N/A   Social History Main  Topics  . Smoking status: Former Smoker    Types: Cigarettes    Quit date: 10/11/2005  . Smokeless tobacco: Never Used  . Alcohol Use: Yes     Comment: occasionally  . Drug Use: No  . Sexual Activity: None   Other Topics Concern  . None   Social History Narrative  . None   Past Surgical History  Procedure Laterality Date  . Shoulder surgery      right  . Carpal tunnel release  2006    right   . Hip fracture surgery  11/2007    right  . Joint replacement  2009    right total hip replacement  . Coronary angioplasty with stent placement     Past Medical History  Diagnosis Date  . Diabetes mellitus   . CAD (coronary artery disease)   . Hypertension   . Leg pain   . Carotid artery occlusion   . Hyperlipidemia   . AAA (abdominal aortic aneurysm)   . 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 Chronic kidney disease     hx kidney stones   BP 117/55  Pulse 76  Resp 14  Ht 5\' 11"  (1.803 m)  Wt 197 lb (89.359 kg)  BMI 27.49 kg/m2  SpO2 93%  Review of Systems  Constitutional: Positive for diaphoresis and appetite change.  Respiratory: Positive for apnea.   Gastrointestinal: Positive for nausea.  Genitourinary: Positive for difficulty urinating.  Musculoskeletal: Positive for gait problem.  Psychiatric/Behavioral: Positive for confusion.  All other systems reviewed and are negative.       Objective:   Physical Exam Constitutional: He is oriented to person, place, and time. He appears well-developed and well-nourished.  HENT:  Head: Normocephalic and atraumatic.  Nose: Nose normal.  Mouth/Throat: Oropharynx is clear and moist.  Eyes: EOM are normal. Pupils are equal, round, and reactive to light.  Neck: Normal range of motion.  Cardiovascular: Normal rate and regular rhythm.  Pulmonary/Chest: Effort normal and breath sounds normal.  Abdominal: Soft. He exhibits no distension. There is no tenderness.  Skin: surgical incisions on  right hand , healing well Musculoskeletal:  Lumbar back: He exhibits decreased range of motion, tenderness, bony tenderness, pain and spasm.  He has extreme tenderness over the greater trochanter on the right. He walks with antalgia on that limb.  Range of motion of right hip : Hip flexion 100, abduction 25 , extension deficit of 10  Right hip strength 5 out of 5  Neurological: He is alert and oriented to person, place, and time.  Psychiatric: He has a normal mood and affect. His behavior is normal. Judgment and thought content normal        Assessment & Plan:  1. 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 Exacerbation of pain, after staying on the beach for a week, prescribed Mobic 15mg  to take foe about a week 1 tablet a day, after this prn. Patient could not tolerate this medication, it made him nauseated. Ordered X-rays of right hip,at his last visit, after he bumped into a corner, to control position of hardware. X-ray did not show any new findings, hardware in place and intact.  2. Trochanteric bursitis, most likely aggrevated by trauma to bursa. Steroid injection given at last visit, gave him only relief for several days. He  followed up with his orthopedic surgeon in September, who ordered a ESI , the ESI had given him relief for about 50-60%, he wanted to receive this injection at his orthopedic surgeon office .He should also continue with the Voltaren gel, which helps him with his hip pain .  3. 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 Right leg length discrepancy.  5. Bilateral hand pain, trigger finger on the left, 5th finger . He had carpal tunnel release, fith trigger finger and dupuytren surgery on his right hand done, 01/18/13, by Dr. Mina Marble. He is following up with him.  PLAN:  1. Stay with Neurontin to 300 mg t.i.d.  2. Refilled oxycodone IR 10 mg 1 q.6 h. p.r.n. #120. Patient tried oxymorphone 10mg  in the past, which did not help at all, he wanted to go  back to his former medication Oxymorphone 30mg  # 60. But his insurance does not pay for this anymore, he is now on morphine sulf. ER 60mg  bid. , refilled this medication also.  4. Continue with your walking and exercise program, advised patient to continue walking in the pool of his [employer], when pain allows, and x-rays are without significant findings. 5. Patient stated that he had some weird experiences with Ambien, like getting up in the middle of the night and eating in the kitchen, without really remembering. I advised the patient to wean off the Ambien and try Melantonin. The patient stated that he  is already trying to wean off, and that he is only taking 1/4 of the tablet at this point. I advised him to wean off completely and try the Melantonin. Follow up in 1 month

## 2013-07-04 ENCOUNTER — Ambulatory Visit: Payer: Medicare Other | Admitting: Physical Medicine and Rehabilitation

## 2013-07-17 ENCOUNTER — Telehealth: Payer: Self-pay

## 2013-07-17 ENCOUNTER — Encounter: Payer: Self-pay | Admitting: Physical Medicine & Rehabilitation

## 2013-07-17 ENCOUNTER — Encounter: Payer: Medicare Other | Attending: Physical Medicine & Rehabilitation | Admitting: Physical Medicine & Rehabilitation

## 2013-07-17 VITALS — BP 133/43 | HR 87 | Resp 14 | Ht 71.0 in | Wt 188.0 lb

## 2013-07-17 DIAGNOSIS — M76899 Other specified enthesopathies of unspecified lower limb, excluding foot: Secondary | ICD-10-CM

## 2013-07-17 DIAGNOSIS — M5126 Other intervertebral disc displacement, lumbar region: Secondary | ICD-10-CM

## 2013-07-17 DIAGNOSIS — IMO0002 Reserved for concepts with insufficient information to code with codable children: Secondary | ICD-10-CM

## 2013-07-17 DIAGNOSIS — M7061 Trochanteric bursitis, right hip: Secondary | ICD-10-CM

## 2013-07-17 DIAGNOSIS — M1610 Unilateral primary osteoarthritis, unspecified hip: Secondary | ICD-10-CM

## 2013-07-17 DIAGNOSIS — G8929 Other chronic pain: Secondary | ICD-10-CM | POA: Insufficient documentation

## 2013-07-17 DIAGNOSIS — G5712 Meralgia paresthetica, left lower limb: Secondary | ICD-10-CM

## 2013-07-17 DIAGNOSIS — G5710 Meralgia paresthetica, unspecified lower limb: Secondary | ICD-10-CM

## 2013-07-17 DIAGNOSIS — M2170 Unequal limb length (acquired), unspecified site: Secondary | ICD-10-CM

## 2013-07-17 DIAGNOSIS — M5116 Intervertebral disc disorders with radiculopathy, lumbar region: Secondary | ICD-10-CM

## 2013-07-17 DIAGNOSIS — T84099A Other mechanical complication of unspecified internal joint prosthesis, initial encounter: Secondary | ICD-10-CM | POA: Insufficient documentation

## 2013-07-17 DIAGNOSIS — M5417 Radiculopathy, lumbosacral region: Secondary | ICD-10-CM

## 2013-07-17 DIAGNOSIS — Y831 Surgical operation with implant of artificial internal device as the cause of abnormal reaction of the patient, or of later complication, without mention of misadventure at the time of the procedure: Secondary | ICD-10-CM | POA: Insufficient documentation

## 2013-07-17 MED ORDER — NORTRIPTYLINE HCL 10 MG PO CAPS: 10.0000 mg | ORAL_CAPSULE | Freq: Every day | ORAL | Status: DC

## 2013-07-17 MED ORDER — OXYCODONE HCL 10 MG PO TABS: 10.0000 mg | ORAL_TABLET | Freq: Four times a day (QID) | ORAL | Status: DC | PRN

## 2013-07-17 MED ORDER — MORPHINE SULFATE ER 60 MG PO TBCR: 60.0000 mg | EXTENDED_RELEASE_TABLET | Freq: Two times a day (BID) | ORAL | Status: DC

## 2013-07-17 NOTE — Telephone Encounter (Signed)
Patient called to let us know that he had a fall last Thursday.  He had xrays done through [facility] imaging.  Patient is scheduled to be seen and xray resutls requested.

## 2013-07-17 NOTE — Progress Notes (Signed)
Subjective:    Patient ID: Thomas [name], male    DOB: 01-31-1941, 72 y.o.   MRN: [id number]  HPI  Thomas [name] is back regarding his chronic right leg and hip pain. Pain has been an ongoing issue.   Recently he fell at home (last Thursday) and landed on his right hip. He had a hip xray done on 10/6 which showed no acute changes. The pain seems most severe on the outer side of his right hip. He had an ESI this summer which he said helped a good bit. However, the last note from my PA indicated that the right hip was STILL very tender last month.  He is on ms contin and oxy for baseline pain control.   He is using a walker for balance and stability, pain control  Pain Inventory Average Pain 7 Pain Right Now 7 My pain is sharp and stabbing  In the last 24 hours, has pain interfered with the following? General activity 8 Relation with others 8 Enjoyment of life 9 What TIME of day is your pain at its worst? all Sleep (in general) Fair  Pain is worse with: walking, bending and standing Pain improves with: rest and medication Relief from Meds: 7  Mobility use a walker how many minutes can you walk? 5 ability to climb steps?  no do you drive?  yes  Function retired I need assistance with the following:  dressing  Neuro/Psych trouble walking  Prior Studies x-rays  Physicians involved in your care Any changes since last visit?  no   Family History  Problem Relation Age of Onset  . Emphysema Mother   . Cancer Father     Brain  . Diabetes Son    History   Social History  . Marital Status: Married    Spouse Name: N/A    Number of Children: N/A  . Years of Education: N/A   Social History Main Topics  . Smoking status: Former Smoker    Types: Cigarettes    Quit date: 10/11/2005  . Smokeless tobacco: Never Used  . Alcohol Use: Yes     Comment: occasionally  . Drug Use: No  . Sexual Activity: None   Other Topics Concern  . None   Social History Narrative   . None   Past Surgical History  Procedure Laterality Date  . Shoulder surgery      right  . Carpal tunnel release  2006    right   . Hip fracture surgery  11/2007    right  . Joint replacement  2009    right total hip replacement  . Coronary angioplasty with stent placement     Past Medical History  Diagnosis Date  . Diabetes mellitus   . CAD (coronary artery disease)   . Hypertension   . Leg pain   . Carotid artery occlusion   . Hyperlipidemia   . AAA (abdominal aortic aneurysm)   . 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 Chronic kidney disease     hx kidney stones   BP 133/43  Pulse 87  Resp 14  Ht 5\' 11"  (1.803 m)  Wt 188 lb (85.276 kg)  BMI 26.23 kg/m2  SpO2 99%    Review of Systems  Constitutional: Positive for diaphoresis and appetite change.  Gastrointestinal: Positive for nausea.  Musculoskeletal: Positive for gait problem.       Hip pain  All other systems reviewed and are negative.  Objective:   Physical Exam  Constitutional: He is oriented to person, place, and time. He appears well-developed and well-nourished.  HENT:  Head: Normocephalic and atraumatic.  Nose: Nose normal.  Mouth/Throat: Oropharynx is clear and moist.  Eyes: EOM are normal. Pupils are equal, round, and reactive to light.  Neck: Normal range of motion.  Cardiovascular: Normal rate and regular rhythm.  Pulmonary/Chest: Effort normal and breath sounds normal.  Abdominal: Soft. He exhibits no distension. There is no tenderness.  Musculoskeletal:  Lumbar back: He exhibits decreased range of motion, tenderness, bony tenderness, pain and spasm.  He continues to have significant point tenderness over the greater trochanter on the right. He has tenderness to the distal third of the outer thigh as well. He walks with antalgia on that limb and needs a cane for balance. Low back is tender to palpation over the psis and the right lower lumbar  paraspinals. He has pain in the fingers, especially the PIP and DIP joints without swelling or gross changes.  Neurological: He is alert and oriented to person, place, and time.  Psychiatric: He has a normal mood and affect. His behavior is normal. Judgment and thought content normal.   Assessment & Plan:   ASSESSMENT:  1. 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 The opana has helped this pain.  2. Meralgia paresthetica, left leg.  3. Lumbar radiculopathy.  contribution from his right trochanteric bursitis as well  4. Right leg length discrepancy.    PLAN:  1. Stay with Neurontin to 300 mg t.i.d.  2. Ordered an MRI of the right hip and lumbar spine given acute increase in right leg pain after fall.  3. MS contin for baseline pain control  4. After informed consent and preparation of the skin with betadine and isopropyl alcohol, I injected  1cc celestone and 3cc of 1% lidocaine around the right greater trochanter via lateral approach. The patient tolerated well, and no complications were encountered. Afterward the area was cleaned and dressed. Post- injection instructions were provided. He had some relief before he left the office today.   5. Add pamelor for night time pain, sleep. Hold [employer]

## 2013-07-17 NOTE — Patient Instructions (Signed)
CALL ME WITH ANY PROBLEMS OR QUESTIONS (#[phone]).  HAVE A GOOD DAY  

## 2013-07-22 ENCOUNTER — Ambulatory Visit
Admission: RE | Admit: 2013-07-22 | Discharge: 2013-07-22 | Disposition: A | Discharge status: Home / Self Care | Payer: Medicare Other | Source: Ambulatory Visit | Attending: Physical Medicine & Rehabilitation | Admitting: Physical Medicine & Rehabilitation

## 2013-07-22 DIAGNOSIS — M5417 Radiculopathy, lumbosacral region: Secondary | ICD-10-CM

## 2013-07-22 DIAGNOSIS — M1610 Unilateral primary osteoarthritis, unspecified hip: Secondary | ICD-10-CM

## 2013-07-22 DIAGNOSIS — G5712 Meralgia paresthetica, left lower limb: Secondary | ICD-10-CM

## 2013-07-23 ENCOUNTER — Telehealth: Payer: Self-pay

## 2013-07-23 NOTE — Telephone Encounter (Signed)
Patient called requesting results from MRI done on 10/12 @ 11 am. Patient states he is in extreme pain, out of his medications and the pharmacy will not fill the RX because its to early. He said he needs to know what to do.

## 2013-07-23 NOTE — Telephone Encounter (Signed)
They were meant to be filled later this month. The patient knew that---we discussed it

## 2013-07-23 NOTE — Telephone Encounter (Signed)
Called to let patient know that the RX was meant to be filled later this month he said he understood that. Patient said that he had made you aware at the appt. that he had to take more medication because of the fell and hip pain. Patient wants to know if you could call him personally at [phone]? He said he does not know what he is going to do about pain management for 10 days.  He also was wondering if his MRI results were back?

## 2013-07-23 NOTE — Telephone Encounter (Signed)
Did you mean for patient to fill RX for MS Contin and oxycodone given at 10/7 now? Patient had same scripts filled on 9/23 and used them, and he called to say the pharmacy will not fill new RX and wanted to know if we could call them to override it. Please advise.

## 2013-07-25 ENCOUNTER — Telehealth: Payer: Self-pay | Admitting: *Deleted

## 2013-07-25 MED ORDER — OXYCODONE HCL 20 MG PO TABS: 20.0000 mg | ORAL_TABLET | Freq: Four times a day (QID) | ORAL | Status: DC | PRN

## 2013-07-25 NOTE — Telephone Encounter (Signed)
Patient request MRI results

## 2013-07-25 NOTE — Telephone Encounter (Signed)
I will increase his oxycodone to 20mg  and we'll keep his MS contin the same. He was instructed to follow up with orthopedics regarding the findings on his MRI.

## 2013-07-25 NOTE — Telephone Encounter (Signed)
Patient aware of oxycodone increase.  He says he cannot get into his ortho doctor until close to thanksgiving.  He will pick up new script.

## 2013-07-25 NOTE — Telephone Encounter (Signed)
Thomas [name] brought in his empty bottle of oxycodone.  The pharmacy had the last rx.  I called and spoke with Lindwood Qua and had him destroy the oxycodone rx and ok the Morphine to be filled tomorrow and new oxycodone rx may be filled today.

## 2013-07-28 ENCOUNTER — Other Ambulatory Visit: Payer: Self-pay | Admitting: Physical Medicine and Rehabilitation

## 2013-08-02 ENCOUNTER — Encounter: Payer: Medicare Other | Admitting: Physical Medicine and Rehabilitation

## 2013-08-09 ENCOUNTER — Other Ambulatory Visit: Payer: Self-pay | Admitting: Physical Medicine and Rehabilitation

## 2013-08-15 ENCOUNTER — Telehealth: Payer: Self-pay | Admitting: Interventional Cardiology

## 2013-08-15 ENCOUNTER — Encounter: Payer: Medicare Other | Admitting: Interventional Cardiology

## 2013-08-15 NOTE — Telephone Encounter (Signed)
Received request from Nurse fax box, documents faxed for surgical clearance. To: [facility] Fax number: [phone] Attention: 08/15/13/KM

## 2013-08-16 ENCOUNTER — Other Ambulatory Visit: Payer: Self-pay

## 2013-08-17 ENCOUNTER — Encounter: Payer: Self-pay | Admitting: Physical Medicine & Rehabilitation

## 2013-08-17 ENCOUNTER — Encounter: Payer: Medicare Other | Attending: Physical Medicine and Rehabilitation | Admitting: Physical Medicine & Rehabilitation

## 2013-08-17 VITALS — BP 107/55 | HR 89 | Resp 14 | Ht 71.0 in | Wt 188.6 lb

## 2013-08-17 DIAGNOSIS — M5126 Other intervertebral disc displacement, lumbar region: Secondary | ICD-10-CM

## 2013-08-17 DIAGNOSIS — M7060 Trochanteric bursitis, unspecified hip: Secondary | ICD-10-CM

## 2013-08-17 DIAGNOSIS — G5710 Meralgia paresthetica, unspecified lower limb: Secondary | ICD-10-CM | POA: Insufficient documentation

## 2013-08-17 DIAGNOSIS — M5116 Intervertebral disc disorders with radiculopathy, lumbar region: Secondary | ICD-10-CM

## 2013-08-17 DIAGNOSIS — M1610 Unilateral primary osteoarthritis, unspecified hip: Secondary | ICD-10-CM | POA: Insufficient documentation

## 2013-08-17 DIAGNOSIS — M76899 Other specified enthesopathies of unspecified lower limb, excluding foot: Secondary | ICD-10-CM

## 2013-08-17 DIAGNOSIS — G5712 Meralgia paresthetica, left lower limb: Secondary | ICD-10-CM

## 2013-08-17 MED ORDER — MORPHINE SULFATE ER 60 MG PO TBCR: 60.0000 mg | EXTENDED_RELEASE_TABLET | Freq: Two times a day (BID) | ORAL | Status: DC

## 2013-08-17 MED ORDER — OXYCODONE HCL 20 MG PO TABS: 20.0000 mg | ORAL_TABLET | ORAL | Status: DC | PRN

## 2013-08-17 NOTE — Patient Instructions (Signed)
CALL ME WITH ANY PROBLEMS OR QUESTIONS (#[phone]).  HAVE A GOOD DAY  

## 2013-08-17 NOTE — Progress Notes (Signed)
Subjective:    Patient ID: Thomas [name], male    DOB: 06/07/1941, 72 y.o.   MRN: [id number]  HPI  Yousof is back regarding his right hip pain. I sent him for an MRI of his lumbar spine and right hip. The right hip MRI was notable for  :  Status post right hip replacement with a small fluid collection  around of intermediate signal intensity about the femoral component  worrisome for metallosis. No bony erosion is identified.   His lumbar spine showed spondylosis but was largely stable/unremarkable.   He feels that MS contin has been helpful for controlling his pain and that the oxycodone helps for an hour or two, but overall his pain levels are high. He particularly struggles at night when he tries to sleep.   He stopped taking the pamelor as he didn't feel as if it helped him at night. He didn't go above the 10mg  dose however. He's back on the [place] now at night.      Pain Inventory Average Pain 7 Pain Right Now 7 My pain is constant, sharp, stabbing and aching  In the last 24 hours, has pain interfered with the following? General activity 7 Relation with others 7 Enjoyment of life 8 What TIME of day is your pain at its worst? morning Sleep (in general) Fair  Pain is worse with: walking Pain improves with: medication Relief from Meds: 8  Mobility use a walker  Function I need assistance with the following:  dressing, bathing, meal prep, household duties and shopping  Neuro/Psych trouble walking  Prior Studies Any changes since last visit?  no  Physicians involved in your care Orthopedist [place]   Family History  Problem Relation Age of Onset  . Emphysema Mother   . Cancer Father     Brain  . Diabetes Son    History   Social History  . Marital Status: Married    Spouse Name: N/A    Number of Children: N/A  . Years of Education: N/A   Social History [name] Topics  . Smoking status: Former Smoker    Types: Cigarettes    Quit date: 10/11/2005  .  Smokeless tobacco: Never Used  . Alcohol Use: Yes     Comment: occasionally  . Drug Use: No  . Sexual Activity: None   Other Topics Concern  . None   Social History Narrative  . None   Past Surgical History  Procedure Laterality Date  . Shoulder surgery      right  . Carpal tunnel release  2006    right   . Hip fracture surgery  11/2007    right  . Joint replacement  2009    right total hip replacement  . Coronary angioplasty with stent placement     Past Medical History  Diagnosis Date  . Diabetes mellitus   . CAD (coronary artery disease)   . Hypertension   . Leg pain   . Carotid artery occlusion   . Hyperlipidemia   . AAA (abdominal aortic aneurysm)   . 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 Chronic kidney disease     hx kidney stones   BP 107/55  Pulse 89  Resp 14  Ht 5\' 11"  (1.803 m)  Wt 188 lb 9.6 oz (85.548 kg)  BMI 26.32 kg/m2  SpO2 95%    Review of Systems  Constitutional: Positive for diaphoresis.  Gastrointestinal: Positive for nausea.  Musculoskeletal: Positive  for gait problem.  All other systems reviewed and are negative.       Objective:   Physical Exam  Constitutional: He is oriented to person, place, and time. He appears well-developed and well-nourished.  HENT:  Head: Normocephalic and atraumatic.  Nose: Nose normal.  Mouth/Throat: Oropharynx is clear and moist.  Eyes: EOM are normal. Pupils are equal, round, and reactive to light.  Neck: Normal range of motion.  Cardiovascular: Normal rate and regular rhythm.  Pulmonary/Chest: Effort normal and breath sounds normal.  Abdominal: Soft. He exhibits no distension. There is no tenderness.  Musculoskeletal:  Lumbar back: He exhibits decreased range of motion, tenderness, bony tenderness, pain and spasm.  He continues to have significant point tenderness over the lateral hip on the right. He has tenderness to the distal third of the outer thigh as well. He  walks with antalgia on that limb and needs a cane for balance. Low back is tender to palpation over the psis and the right lower lumbar paraspinals. Continued pain in the fingers, especially the PIP and DIP joints without swelling or gross changes.  Neurological: He is alert and oriented to person, place, and time.  Psychiatric: He has a normal mood and affect. His behavior is normal. Judgment and thought content normal.    Assessment & Plan:   ASSESSMENT:  1. 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 ?Metallosis.   2. Meralgia paresthetica, left leg.  3. Lumbar radiculopathy.  contribution from his right trochanteric bursitis as well.  4. Right leg length discrepancy.    PLAN:  1. Continue Neurontin 300 mg t.i.d.  2. Will refill the oxycodone for breakthrough pain  3. MS contin for baseline pain control which has worked well.  4. Right hip revision per Dr. Charlann Boxer on 09/03/13. I feel that surgery gives him the best opportunity for pain relief. I would recommend continuing on his current regimen for pain relief post-operatively as I expect his pain levels to be the same if not decreased after surgery.  5. Stay with Remus Loffler for sleep.

## 2013-08-23 ENCOUNTER — Encounter: Payer: Self-pay | Admitting: Interventional Cardiology

## 2013-08-28 ENCOUNTER — Encounter (HOSPITAL_COMMUNITY): Payer: Self-pay | Admitting: Pharmacy Technician

## 2013-08-30 ENCOUNTER — Encounter (HOSPITAL_COMMUNITY): Payer: Self-pay

## 2013-08-30 ENCOUNTER — Ambulatory Visit (HOSPITAL_COMMUNITY)
Admission: RE | Admit: 2013-08-30 | Discharge: 2013-08-30 | Disposition: A | Discharge status: Home / Self Care | Payer: Medicare Other | Source: Ambulatory Visit | Attending: Orthopedic Surgery | Admitting: Orthopedic Surgery

## 2013-08-30 ENCOUNTER — Encounter (HOSPITAL_COMMUNITY)
Admission: RE | Admit: 2013-08-30 | Discharge: 2013-08-30 | Disposition: A | Discharge status: Home / Self Care | Payer: Medicare Other | Source: Ambulatory Visit | Attending: Orthopedic Surgery | Admitting: Orthopedic Surgery

## 2013-08-30 DIAGNOSIS — Z01818 Encounter for other preprocedural examination: Secondary | ICD-10-CM | POA: Insufficient documentation

## 2013-08-30 DIAGNOSIS — Z01812 Encounter for preprocedural laboratory examination: Secondary | ICD-10-CM | POA: Insufficient documentation

## 2013-08-30 HISTORY — DX: Unspecified osteoarthritis, unspecified site: M19.90

## 2013-08-30 LAB — BASIC METABOLIC PANEL
CO2: 27 mEq/L (ref 19–32)
Calcium: 9.7 mg/dL (ref 8.4–10.5)
Creatinine, Ser: 0.8 mg/dL (ref 0.50–1.35)
GFR calc Af Amer: >90 mL/min (ref >90)
GFR calc non Af Amer: 88 mL/min — ABNORMAL LOW (ref >90)
Glucose, Bld: 235 mg/dL — ABNORMAL HIGH (ref 70–99)
Potassium: 4.7 mEq/L (ref 3.5–5.1)

## 2013-08-30 LAB — CBC
Hemoglobin: 12.3 g/dL — ABNORMAL LOW (ref 13.0–17.0)
MCV: 89 fL (ref 78.0–100.0)
Platelets: 219 10*3/uL (ref 150–400)
RBC: 4.18 MIL/uL — ABNORMAL LOW (ref 4.22–5.81)
RDW: 12 % (ref 11.5–15.5)
WBC: 6.4 10*3/uL (ref 4.0–10.5)

## 2013-08-30 LAB — APTT: aPTT: 33 seconds (ref 24–37)

## 2013-08-30 LAB — URINALYSIS, ROUTINE W REFLEX MICROSCOPIC
Glucose, UA: 500 mg/dL — AB
Hgb urine dipstick: NEGATIVE
Leukocytes, UA: NEGATIVE
Protein, ur: NEGATIVE mg/dL
Specific Gravity, Urine: 1.032 — ABNORMAL HIGH (ref 1.005–1.030)

## 2013-08-30 LAB — SURGICAL PCR SCREEN
MRSA, PCR: NEGATIVE
Staphylococcus aureus: NEGATIVE

## 2013-08-30 LAB — ABO/RH: ABO/RH(D): A POS

## 2013-08-30 LAB — PROTIME-INR: Prothrombin Time: 13.5 seconds (ref 11.6–15.2)

## 2013-08-30 NOTE — H&P (Signed)
TOTAL HIP REVISION ADMISSION H&P  Patient is admitted for right revision total hip arthroplasty.  Subjective:  Chief Complaint:    Right hip pain s/p total hip arthroplasty  HPI: Thomas [name], 72 y.o. male, has a history of pain and functional disability in the right hip due to failed total hip arthroplasty and patient has failed non-surgical conservative treatments for greater than 12 weeks to include NSAID's and/or analgesics, supervised PT with diminished ADL's post treatment, use of assistive devices and activity modification. The indications for the revision total hip arthroplasty are bearing surface wear leading to  symptomatic synovitis and hip instability.  Onset of symptoms was abrupt starting 5 years ago with gradually worsening course since that time.  Prior procedures on the right hip include arthroplasty per Dr. Eulah Pont on 2009.  Patient currently rates pain in the right hip at 8 out of 10 with activity.  There is night pain, worsening of pain with activity and weight bearing, trendelenberg gait, pain that interfers with activities of daily living, pain with passive range of motion and joint swelling. Patient has evidence of fluid collection surrounding the total hip arthroplasty by imaging studies.  This condition presents safety issues increasing the risk of falls. Discussion was had regarding possibly doing an osteotomy to remove the prosthesis and changing the position of the acetabular component.    There is no current active infection with normal sed rate and CRP labs.  Risks, benefits and expectations were discussed with the patient.  Risks including but not limited to the risk of anesthesia, blood clots, nerve damage, blood vessel damage, failure of the prosthesis, infection and up to and including death.  Patient understand the risks, benefits and expectations and wishes to proceed with surgery.   D/C Plans:   Home with HHPT  Post-op Meds:   No Rx given   Tranexamic Acid:   Not  to be given - CAD  Decadron:    Not to be given - DM  FYI:    Xarelto post-op and then ASA  Morphine and Oxy post-op   Patient Active Problem List   Diagnosis Date Noted  . Enthesopathy of hip region/greater troch left hip 05/31/2012  . Greater trochanteric bursitis (right) 12/31/2011  . Lumbar disc disease with radiculopathy 12/01/2011  . Arthritis, hip 12/01/2011  . Leg length discrepancy 12/01/2011  . Meralgia paraesthetica 12/01/2011  . Atherosclerosis of native arteries of the extremities with intermittent claudication 08/23/2011   Past Medical History  Diagnosis Date  . Diabetes mellitus   . CAD (coronary artery disease)   . Hypertension   . Leg pain   . Carotid artery occlusion   . Hyperlipidemia   . AAA (abdominal aortic aneurysm)   . 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 Chronic kidney disease     hx kidney stones  . Arthritis     Past Surgical History  Procedure Laterality Date  . Shoulder surgery      right  . Carpal tunnel release  2006    right   . Hip fracture surgery  11/2007    right  . Joint replacement  2009    right total hip replacement  . Coronary angioplasty with stent placement    . Tonsillectomy      as child    No prescriptions prior to admission   Allergies  Allergen Reactions  . Nsaids Diarrhea    History  Substance Use Topics  . Smoking  status: Former Smoker    Types: Cigarettes    Quit date: 10/11/2005  . Smokeless tobacco: Never Used  . Alcohol Use: Yes     Comment: occasionally    Family History  Problem Relation Age of Onset  . Emphysema Mother   . Cancer Father     Brain  . Diabetes Son       Review of Systems  Constitutional: Negative.   HENT: Negative.   Eyes: Negative.   Respiratory: Negative.   Cardiovascular: Negative.   Gastrointestinal: Negative.   Genitourinary: Negative.   Musculoskeletal: Positive for back pain and joint pain.  Skin: Negative.   Neurological:  Negative.   Endo/Heme/Allergies: Negative.   Psychiatric/Behavioral: Negative.     Objective:  Physical Exam  Constitutional: He is oriented to person, place, and time. He appears well-developed and well-nourished.  HENT:  Head: Normocephalic and atraumatic.  Mouth/Throat: Oropharynx is clear and moist.  Eyes: Pupils are equal, round, and reactive to light.  Neck: Neck supple. No JVD present. No tracheal deviation present. No thyromegaly present.  Cardiovascular: Normal rate, regular rhythm, normal heart sounds and intact distal pulses.   Respiratory: Effort normal and breath sounds normal. No respiratory distress. He has no wheezes.  GI: Soft. There is no tenderness. There is no guarding.  Musculoskeletal:       Right hip: He exhibits decreased range of motion, decreased strength, tenderness, bony tenderness and swelling. He exhibits no deformity and no laceration.  Lymphadenopathy:    He has no cervical adenopathy.  Neurological: He is alert and oriented to person, place, and time.  Skin: Skin is warm and dry.  Psychiatric: He has a normal mood and affect.    Vital signs in last 24 hours: Temp:  [98.4 F (36.9 C)] 98.4 F (36.9 C) (11/20 1213) Pulse Rate:  [80] 80 (11/20 1213) Resp:  [16] 16 (11/20 1213) BP: (132)/(60) 132/60 mmHg (11/20 1213) SpO2:  [96 %] 96 % (11/20 1213) Weight:  [84.369 kg (186 lb)] 84.369 kg (186 lb) (11/20 1213)   Labs:   Estimated body mass index is 26.23 kg/(m^2) as calculated from the following:   Height as of 07/17/13: 5\' 11"  (1.803 m).   Weight as of 07/17/13: 85.276 kg (188 lb).  Imaging Review:  Plain radiographs demonstrate previous total hip arthroplasty of the right hip(s). There is evidence of fluid build up in the hip surrounding the prosthesis.  The bone quality appears to be good for age and reported activity level.   Assessment/Plan:  End stage arthritis, right hip(s) with failed previous arthroplasty.  The patient history,  physical examination, clinical judgement of the provider and imaging studies are consistent with end stage degenerative joint disease of the right hip(s), previous total hip arthroplasty. Revision total hip arthroplasty is deemed medically necessary. The treatment options including medical management, injection therapy, arthroscopy and arthroplasty were discussed at length. The risks and benefits of total hip arthroplasty were presented and reviewed. The risks due to aseptic loosening, infection, stiffness, dislocation/subluxation,  thromboembolic complications and other imponderables were discussed.  The patient acknowledged the explanation, agreed to proceed with the plan and consent was signed. Patient is being admitted for inpatient treatment for surgery, pain control, PT, OT, prophylactic antibiotics, VTE prophylaxis, progressive ambulation and ADL's and discharge planning. The patient is planning to be discharged home with home health services.    Anastasio Auerbach Stephon Weathers   PAC  08/30/2013, 5:10 PM

## 2013-08-30 NOTE — Patient Instructions (Signed)
20      Your procedure is scheduled on:  Monday 09/03/2013  Report to [facility] at [phone] AM.  Call this number if you have problems the night before or morning of surgery:  [phone]   Remember:             IF YOU USE CPAP,BRING MASK AND TUBING AM OF SURGERY!   Do not eat food or drink liquids AFTER MIDNIGHT!  Take these medicines the morning of surgery with A SIP OF WATER: Isosorbide, Neurontin, Morphine   Do not bring valuables to the hospital. [facility] IS NOT RESPONSIBLE FOR ANY BELONGINGS OR VALUABLES BROUGHT TO HOSPITAL.  Marland Kitchen  Leave suitcase in the car. After surgery it may be brought to your room.  For patients admitted to the hospital, checkout time is 11:00 AM the day of              Discharge.    DO NOT WEAR  JEWELRY,MAKE-UP,LOTIONS,POWDERS,PERFUMES,CONTACTS , DENTURES OR BRIDGEWORK ,AND DO NOT WEAR FALSE EYELASHES                                    Patients discharged the day of surgery will not be allowed to drive home.If going home the same day of surgery, must have someone stay with you first 24 hrs.at home and arrange for someone to drive you home from the Hospital.                         YOUR DRIVER IS:N/A   Special Instructions: IF YOU DO NOT HAVE YOUR TYPE AND SCREEN DRAWN THE DAY OF YOU PRE-OP VISIT, YOU WILL HAVE IT DRAWN THE AM OF SURGERY!             Please read over the following fact sheets that you were given:             1. [facility] PREPARING FOR SURGERY SHEET              2.[facility]                                        [place].Kamren Heintzelman,RN,BSN     [phone]                FAILURE TO FOLLOW THESE INSTRUCTIONS MAY RESULT IN  CANCELLATION OF YOUR SURGERY!               Patient Signature:___________________________

## 2013-08-30 NOTE — Progress Notes (Signed)
12/29/2012-Carotid Artery Duplex from [facility] on chart. 06/23/2011-Lower Extremity Arterial Duplex from [facility] on chart.

## 2013-08-30 NOTE — Progress Notes (Signed)
Pre-op clearance from Dr. Eldridge Dace 06/01/2013 on chart.  Pre-op clearance from Dr. Bernardo Heater from 08/13/2013 on chart. EKG from [facility] from 05/24/2013 and last office visit from Dr. Eldridge Dace on chart. Stress test from [facility] from 06/01/2013 on chart. Echocardiogram from [facility] 11/01/2006 on chart.

## 2013-08-30 NOTE — Progress Notes (Signed)
07/08/2011- Peripheral Vascular Invasive Procedure at [facility] on chart. 11/01/2006-Echocardiogram from [facility] on chart. 10/06/2007- Cardiac Catherization at [facility] on chart.

## 2013-09-03 ENCOUNTER — Inpatient Hospital Stay (HOSPITAL_COMMUNITY): Payer: Medicare Other

## 2013-09-03 ENCOUNTER — Inpatient Hospital Stay (HOSPITAL_COMMUNITY)
Admission: RE | Admit: 2013-09-03 | Discharge: 2013-09-07 | DRG: 467 | Disposition: A | Discharge status: Skilled Nursing Facility | Payer: Medicare Other | Source: Ambulatory Visit | Attending: Orthopedic Surgery | Admitting: Orthopedic Surgery

## 2013-09-03 ENCOUNTER — Inpatient Hospital Stay (HOSPITAL_COMMUNITY): Payer: Medicare Other | Admitting: Anesthesiology

## 2013-09-03 ENCOUNTER — Encounter (HOSPITAL_COMMUNITY): Payer: Self-pay | Admitting: *Deleted

## 2013-09-03 ENCOUNTER — Encounter (HOSPITAL_COMMUNITY): Payer: Medicare Other | Admitting: Anesthesiology

## 2013-09-03 ENCOUNTER — Encounter (HOSPITAL_COMMUNITY)
Admission: RE | Disposition: A | Discharge status: Skilled Nursing Facility | Payer: Self-pay | Source: Ambulatory Visit | Attending: Orthopedic Surgery

## 2013-09-03 DIAGNOSIS — I714 Abdominal aortic aneurysm, without rupture, unspecified: Secondary | ICD-10-CM | POA: Diagnosis present

## 2013-09-03 DIAGNOSIS — Z6825 Body mass index (BMI) 25.0-25.9, adult: Secondary | ICD-10-CM

## 2013-09-03 DIAGNOSIS — I2789 Other specified pulmonary heart diseases: Secondary | ICD-10-CM | POA: Diagnosis present

## 2013-09-03 DIAGNOSIS — E663 Overweight: Secondary | ICD-10-CM | POA: Diagnosis present

## 2013-09-03 DIAGNOSIS — Z833 Family history of diabetes mellitus: Secondary | ICD-10-CM

## 2013-09-03 DIAGNOSIS — Z9861 Coronary angioplasty status: Secondary | ICD-10-CM

## 2013-09-03 DIAGNOSIS — M1610 Unilateral primary osteoarthritis, unspecified hip: Secondary | ICD-10-CM | POA: Diagnosis present

## 2013-09-03 DIAGNOSIS — Z87891 Personal history of nicotine dependence: Secondary | ICD-10-CM

## 2013-09-03 DIAGNOSIS — E119 Type 2 diabetes mellitus without complications: Secondary | ICD-10-CM | POA: Diagnosis present

## 2013-09-03 DIAGNOSIS — E785 Hyperlipidemia, unspecified: Secondary | ICD-10-CM | POA: Diagnosis present

## 2013-09-03 DIAGNOSIS — T84099A Other mechanical complication of unspecified internal joint prosthesis, initial encounter: Principal | ICD-10-CM | POA: Diagnosis present

## 2013-09-03 DIAGNOSIS — Y831 Surgical operation with implant of artificial internal device as the cause of abnormal reaction of the patient, or of later complication, without mention of misadventure at the time of the procedure: Secondary | ICD-10-CM | POA: Diagnosis present

## 2013-09-03 DIAGNOSIS — M169 Osteoarthritis of hip, unspecified: Secondary | ICD-10-CM | POA: Diagnosis present

## 2013-09-03 DIAGNOSIS — Z96649 Presence of unspecified artificial hip joint: Secondary | ICD-10-CM

## 2013-09-03 DIAGNOSIS — I4892 Unspecified atrial flutter: Secondary | ICD-10-CM

## 2013-09-03 DIAGNOSIS — I6529 Occlusion and stenosis of unspecified carotid artery: Secondary | ICD-10-CM | POA: Diagnosis present

## 2013-09-03 DIAGNOSIS — D62 Acute posthemorrhagic anemia: Secondary | ICD-10-CM | POA: Diagnosis not present

## 2013-09-03 DIAGNOSIS — I2510 Atherosclerotic heart disease of native coronary artery without angina pectoris: Secondary | ICD-10-CM

## 2013-09-03 DIAGNOSIS — Z87442 Personal history of urinary calculi: Secondary | ICD-10-CM

## 2013-09-03 DIAGNOSIS — I739 Peripheral vascular disease, unspecified: Secondary | ICD-10-CM | POA: Diagnosis present

## 2013-09-03 DIAGNOSIS — F411 Generalized anxiety disorder: Secondary | ICD-10-CM | POA: Diagnosis present

## 2013-09-03 DIAGNOSIS — M2540 Effusion, unspecified joint: Secondary | ICD-10-CM | POA: Diagnosis present

## 2013-09-03 DIAGNOSIS — I10 Essential (primary) hypertension: Secondary | ICD-10-CM | POA: Diagnosis present

## 2013-09-03 DIAGNOSIS — Z794 Long term (current) use of insulin: Secondary | ICD-10-CM

## 2013-09-03 HISTORY — PX: TOTAL HIP REVISION: SHX763

## 2013-09-03 HISTORY — DX: Disorder of arteries and arterioles, unspecified: I77.9

## 2013-09-03 HISTORY — DX: Type 2 diabetes mellitus without complications: E11.9

## 2013-09-03 HISTORY — DX: Peripheral vascular disease, unspecified: I73.9

## 2013-09-03 LAB — TYPE AND SCREEN
ABO/RH(D): A POS
Antibody Screen: NEGATIVE

## 2013-09-03 LAB — GLUCOSE, CAPILLARY
Glucose-Capillary: 235 mg/dL — ABNORMAL HIGH (ref 70–99)
Glucose-Capillary: 215 mg/dL — ABNORMAL HIGH (ref 70–99)

## 2013-09-03 SURGERY — TOTAL HIP REVISION
Anesthesia: General | Site: Hip | Laterality: Right | Wound class: Clean

## 2013-09-03 MED ORDER — PHENYLEPHRINE HCL 10 MG/ML IJ SOLN
INTRAMUSCULAR | Status: DC | PRN
  Administered 2013-09-03: 80 ug via INTRAVENOUS

## 2013-09-03 MED ORDER — LACTATED RINGERS IV SOLN: INTRAVENOUS | Status: DC

## 2013-09-03 MED ORDER — FENTANYL CITRATE 0.05 MG/ML IJ SOLN
INTRAMUSCULAR | Status: AC
  Filled 2013-09-03: qty 2

## 2013-09-03 MED ORDER — FLEET ENEMA 7-19 GM/118ML RE ENEM: 1.0000 | ENEMA | Freq: Once | RECTAL | Status: AC | PRN

## 2013-09-03 MED ORDER — GLYCOPYRROLATE 0.2 MG/ML IJ SOLN
INTRAMUSCULAR | Status: AC
  Filled 2013-09-03: qty 3

## 2013-09-03 MED ORDER — 0.9 % SODIUM CHLORIDE (POUR BTL) OPTIME
TOPICAL | Status: DC | PRN
  Administered 2013-09-03 (×2): 1000 mL

## 2013-09-03 MED ORDER — METHOCARBAMOL 100 MG/ML IJ SOLN
500.0000 mg | Freq: Four times a day (QID) | INTRAVENOUS | Status: DC | PRN
  Administered 2013-09-03: 500 mg via INTRAVENOUS
  Filled 2013-09-03 (×2): qty 5

## 2013-09-03 MED ORDER — HYDROMORPHONE HCL PF 1 MG/ML IJ SOLN
0.5000 mg | INTRAMUSCULAR | Status: DC | PRN
  Administered 2013-09-03 – 2013-09-05 (×2): 1 mg via INTRAVENOUS
  Administered 2013-09-06: 0.5 mg via INTRAVENOUS
  Filled 2013-09-03: qty 2
  Filled 2013-09-03 (×2): qty 1

## 2013-09-03 MED ORDER — FENTANYL CITRATE 0.05 MG/ML IJ SOLN
50.0000 ug | INTRAMUSCULAR | Status: DC | PRN
  Administered 2013-09-03 (×2): 50 ug via INTRAVENOUS

## 2013-09-03 MED ORDER — ATORVASTATIN CALCIUM 20 MG PO TABS
20.0000 mg | ORAL_TABLET | Freq: Every day | ORAL | Status: DC
  Administered 2013-09-03 – 2013-09-06 (×4): 20 mg via ORAL
  Filled 2013-09-03 (×6): qty 1

## 2013-09-03 MED ORDER — MENTHOL 3 MG MT LOZG: 1.0000 | LOZENGE | OROMUCOSAL | Status: DC | PRN

## 2013-09-03 MED ORDER — HYDROMORPHONE HCL PF 1 MG/ML IJ SOLN
INTRAMUSCULAR | Status: DC | PRN
  Administered 2013-09-03 (×4): 0.5 mg via INTRAVENOUS

## 2013-09-03 MED ORDER — FENTANYL CITRATE 0.05 MG/ML IJ SOLN
INTRAMUSCULAR | Status: AC
  Filled 2013-09-03: qty 5

## 2013-09-03 MED ORDER — ONDANSETRON HCL 4 MG PO TABS: 4.0000 mg | ORAL_TABLET | Freq: Four times a day (QID) | ORAL | Status: DC | PRN

## 2013-09-03 MED ORDER — MORPHINE SULFATE ER 30 MG PO TBCR
60.0000 mg | EXTENDED_RELEASE_TABLET | Freq: Two times a day (BID) | ORAL | Status: DC
  Administered 2013-09-03 – 2013-09-07 (×8): 60 mg via ORAL
  Filled 2013-09-03 (×8): qty 2

## 2013-09-03 MED ORDER — HYDROMORPHONE HCL PF 1 MG/ML IJ SOLN
INTRAMUSCULAR | Status: AC
  Filled 2013-09-03: qty 1

## 2013-09-03 MED ORDER — LIDOCAINE HCL (CARDIAC) 20 MG/ML IV SOLN
INTRAVENOUS | Status: DC | PRN
  Administered 2013-09-03: 100 mg via INTRAVENOUS

## 2013-09-03 MED ORDER — LACTATED RINGERS IV SOLN: INTRAVENOUS | Status: DC | PRN

## 2013-09-03 MED ORDER — INSULIN ASPART 100 UNIT/ML ~~LOC~~ SOLN
0.0000 [IU] | SUBCUTANEOUS | Status: DC
  Administered 2013-09-03: 5 [IU] via SUBCUTANEOUS

## 2013-09-03 MED ORDER — CEFAZOLIN SODIUM-DEXTROSE 2-3 GM-% IV SOLR
2.0000 g | INTRAVENOUS | Status: AC
  Administered 2013-09-03: 2 g via INTRAVENOUS

## 2013-09-03 MED ORDER — LABETALOL HCL 100 MG PO TABS
100.0000 mg | ORAL_TABLET | Freq: Two times a day (BID) | ORAL | Status: DC
  Administered 2013-09-03 – 2013-09-05 (×4): 100 mg via ORAL
  Filled 2013-09-03 (×5): qty 1

## 2013-09-03 MED ORDER — NEOSTIGMINE METHYLSULFATE 1 MG/ML IJ SOLN
INTRAMUSCULAR | Status: AC
  Filled 2013-09-03: qty 10

## 2013-09-03 MED ORDER — ISOSORBIDE MONONITRATE ER 30 MG PO TB24
30.0000 mg | ORAL_TABLET | Freq: Every morning | ORAL | Status: DC
  Administered 2013-09-04 – 2013-09-07 (×4): 30 mg via ORAL
  Filled 2013-09-03 (×4): qty 1

## 2013-09-03 MED ORDER — INSULIN ASPART 100 UNIT/ML ~~LOC~~ SOLN
0.0000 [IU] | Freq: Three times a day (TID) | SUBCUTANEOUS | Status: DC
  Administered 2013-09-03 – 2013-09-04 (×2): 5 [IU] via SUBCUTANEOUS
  Administered 2013-09-04 – 2013-09-05 (×3): 3 [IU] via SUBCUTANEOUS
  Administered 2013-09-05 (×2): 5 [IU] via SUBCUTANEOUS
  Administered 2013-09-06: 3 [IU] via SUBCUTANEOUS
  Administered 2013-09-06: 5 [IU] via SUBCUTANEOUS
  Administered 2013-09-06 – 2013-09-07 (×2): 2 [IU] via SUBCUTANEOUS

## 2013-09-03 MED ORDER — BISACODYL 10 MG RE SUPP: 10.0000 mg | Freq: Every day | RECTAL | Status: DC | PRN

## 2013-09-03 MED ORDER — OXYCODONE HCL 5 MG PO TABS
20.0000 mg | ORAL_TABLET | ORAL | Status: DC
  Administered 2013-09-03 – 2013-09-07 (×16): 20 mg via ORAL
  Filled 2013-09-03 (×18): qty 4

## 2013-09-03 MED ORDER — NITROGLYCERIN 0.4 MG SL SUBL: 0.4000 mg | SUBLINGUAL_TABLET | SUBLINGUAL | Status: DC | PRN

## 2013-09-03 MED ORDER — PHENOL 1.4 % MT LIQD: 1.0000 | OROMUCOSAL | Status: DC | PRN

## 2013-09-03 MED ORDER — DIPHENHYDRAMINE HCL 25 MG PO CAPS: 25.0000 mg | ORAL_CAPSULE | Freq: Four times a day (QID) | ORAL | Status: DC | PRN

## 2013-09-03 MED ORDER — MIDAZOLAM HCL 2 MG/2ML IJ SOLN
1.0000 mg | INTRAMUSCULAR | Status: DC | PRN
  Administered 2013-09-03: 1 mg via INTRAVENOUS

## 2013-09-03 MED ORDER — LORAZEPAM 1 MG PO TABS: 1.0000 mg | ORAL_TABLET | Freq: Three times a day (TID) | ORAL | Status: DC | PRN

## 2013-09-03 MED ORDER — POLYETHYLENE GLYCOL 3350 17 G PO PACK
17.0000 g | PACK | Freq: Two times a day (BID) | ORAL | Status: DC
  Administered 2013-09-03 – 2013-09-07 (×5): 17 g via ORAL
  Filled 2013-09-03 (×5): qty 1

## 2013-09-03 MED ORDER — MIDAZOLAM HCL 2 MG/2ML IJ SOLN
INTRAMUSCULAR | Status: AC
  Filled 2013-09-03: qty 2

## 2013-09-03 MED ORDER — LORAZEPAM 2 MG/ML IJ SOLN
1.0000 mg | Freq: Four times a day (QID) | INTRAMUSCULAR | Status: DC | PRN
  Filled 2013-09-03: qty 1

## 2013-09-03 MED ORDER — DOCUSATE SODIUM 100 MG PO CAPS
100.0000 mg | ORAL_CAPSULE | Freq: Two times a day (BID) | ORAL | Status: DC
  Administered 2013-09-03 – 2013-09-07 (×7): 100 mg via ORAL
  Filled 2013-09-03 (×5): qty 1

## 2013-09-03 MED ORDER — ACETAMINOPHEN 325 MG PO TABS
650.0000 mg | ORAL_TABLET | Freq: Four times a day (QID) | ORAL | Status: DC | PRN
  Administered 2013-09-03 – 2013-09-04 (×3): 650 mg via ORAL
  Filled 2013-09-03 (×3): qty 2

## 2013-09-03 MED ORDER — HYDROMORPHONE HCL PF 2 MG/ML IJ SOLN
INTRAMUSCULAR | Status: AC
  Filled 2013-09-03: qty 1

## 2013-09-03 MED ORDER — METFORMIN HCL 500 MG PO TABS
1000.0000 mg | ORAL_TABLET | Freq: Every day | ORAL | Status: DC
  Administered 2013-09-04 – 2013-09-07 (×3): 1000 mg via ORAL
  Filled 2013-09-03 (×5): qty 2

## 2013-09-03 MED ORDER — LORAZEPAM 1 MG PO TABS
1.0000 mg | ORAL_TABLET | Freq: Four times a day (QID) | ORAL | Status: DC | PRN
  Administered 2013-09-04: 12:00:00 1 mg via ORAL
  Filled 2013-09-03: qty 1

## 2013-09-03 MED ORDER — GABAPENTIN 300 MG PO CAPS
300.0000 mg | ORAL_CAPSULE | Freq: Three times a day (TID) | ORAL | Status: DC
  Administered 2013-09-03 – 2013-09-07 (×11): 300 mg via ORAL
  Filled 2013-09-03 (×17): qty 1

## 2013-09-03 MED ORDER — ONDANSETRON HCL 4 MG/2ML IJ SOLN
INTRAMUSCULAR | Status: AC
  Filled 2013-09-03: qty 2

## 2013-09-03 MED ORDER — INSULIN GLARGINE 100 UNIT/ML ~~LOC~~ SOLN
60.0000 [IU] | Freq: Every day | SUBCUTANEOUS | Status: DC
  Administered 2013-09-03 – 2013-09-06 (×4): 60 [IU] via SUBCUTANEOUS
  Filled 2013-09-03 (×5): qty 0.6

## 2013-09-03 MED ORDER — LORAZEPAM 2 MG/ML IJ SOLN
1.0000 mg | Freq: Three times a day (TID) | INTRAMUSCULAR | Status: DC | PRN
  Administered 2013-09-03: 1 mg via INTRAVENOUS

## 2013-09-03 MED ORDER — PHENYLEPHRINE 40 MCG/ML (10ML) SYRINGE FOR IV PUSH (FOR BLOOD PRESSURE SUPPORT)
PREFILLED_SYRINGE | INTRAVENOUS | Status: AC
  Filled 2013-09-03: qty 10

## 2013-09-03 MED ORDER — INSULIN LISPRO 100 UNIT/ML (KWIKPEN): 30.0000 [IU] | PEN_INJECTOR | Freq: Every morning | SUBCUTANEOUS | Status: DC

## 2013-09-03 MED ORDER — HYDROMORPHONE HCL PF 1 MG/ML IJ SOLN
0.2500 mg | INTRAMUSCULAR | Status: DC | PRN
  Administered 2013-09-03 (×4): 0.5 mg via INTRAVENOUS

## 2013-09-03 MED ORDER — METOCLOPRAMIDE HCL 10 MG PO TABS: 5.0000 mg | ORAL_TABLET | Freq: Three times a day (TID) | ORAL | Status: DC | PRN

## 2013-09-03 MED ORDER — CHLORHEXIDINE GLUCONATE 4 % EX LIQD: 60.0000 mL | Freq: Once | CUTANEOUS | Status: DC

## 2013-09-03 MED ORDER — LORAZEPAM 2 MG/ML IJ SOLN
INTRAMUSCULAR | Status: AC
  Administered 2013-09-03: 1 mg via INTRAVENOUS
  Filled 2013-09-03: qty 1

## 2013-09-03 MED ORDER — ONDANSETRON HCL 4 MG/2ML IJ SOLN: 4.0000 mg | Freq: Four times a day (QID) | INTRAMUSCULAR | Status: DC | PRN

## 2013-09-03 MED ORDER — CEFAZOLIN SODIUM-DEXTROSE 2-3 GM-% IV SOLR
INTRAVENOUS | Status: AC
  Filled 2013-09-03: qty 50

## 2013-09-03 MED ORDER — INSULIN GLULISINE 100 UNIT/ML SOLOSTAR PEN: 15.0000 [IU] | PEN_INJECTOR | Freq: Every morning | SUBCUTANEOUS | Status: DC

## 2013-09-03 MED ORDER — ONDANSETRON HCL 4 MG/2ML IJ SOLN
INTRAMUSCULAR | Status: DC | PRN
  Administered 2013-09-03: 4 mg via INTRAVENOUS

## 2013-09-03 MED ORDER — ZOLPIDEM TARTRATE 5 MG PO TABS
5.0000 mg | ORAL_TABLET | Freq: Every evening | ORAL | Status: DC | PRN
  Administered 2013-09-03: 22:00:00 5 mg via ORAL
  Filled 2013-09-03: qty 1

## 2013-09-03 MED ORDER — METOCLOPRAMIDE HCL 5 MG/ML IJ SOLN: 5.0000 mg | Freq: Three times a day (TID) | INTRAMUSCULAR | Status: DC | PRN

## 2013-09-03 MED ORDER — ROCURONIUM BROMIDE 100 MG/10ML IV SOLN
INTRAVENOUS | Status: DC | PRN
  Administered 2013-09-03: 50 mg via INTRAVENOUS

## 2013-09-03 MED ORDER — FERROUS SULFATE 325 (65 FE) MG PO TABS
325.0000 mg | ORAL_TABLET | Freq: Three times a day (TID) | ORAL | Status: DC
  Administered 2013-09-03 – 2013-09-07 (×9): 325 mg via ORAL
  Filled 2013-09-03 (×15): qty 1

## 2013-09-03 MED ORDER — GLYCOPYRROLATE 0.2 MG/ML IJ SOLN
INTRAMUSCULAR | Status: DC | PRN
  Administered 2013-09-03: .6 mg via INTRAVENOUS

## 2013-09-03 MED ORDER — INSULIN ASPART 100 UNIT/ML ~~LOC~~ SOLN
SUBCUTANEOUS | Status: AC
  Filled 2013-09-03: qty 1

## 2013-09-03 MED ORDER — GLIPIZIDE 10 MG PO TABS
10.0000 mg | ORAL_TABLET | Freq: Every day | ORAL | Status: DC
  Administered 2013-09-04 – 2013-09-07 (×3): 10 mg via ORAL
  Filled 2013-09-03 (×5): qty 1

## 2013-09-03 MED ORDER — NEOSTIGMINE METHYLSULFATE 1 MG/ML IJ SOLN
INTRAMUSCULAR | Status: DC | PRN
  Administered 2013-09-03: 4 mg via INTRAVENOUS

## 2013-09-03 MED ORDER — PROPOFOL 10 MG/ML IV BOLUS
INTRAVENOUS | Status: AC
  Filled 2013-09-03: qty 20

## 2013-09-03 MED ORDER — METHOCARBAMOL 500 MG PO TABS: 500.0000 mg | ORAL_TABLET | Freq: Four times a day (QID) | ORAL | Status: DC | PRN

## 2013-09-03 MED ORDER — MIDAZOLAM HCL 5 MG/5ML IJ SOLN
INTRAMUSCULAR | Status: DC | PRN
  Administered 2013-09-03: 2 mg via INTRAVENOUS

## 2013-09-03 MED ORDER — FENTANYL CITRATE 0.05 MG/ML IJ SOLN
INTRAMUSCULAR | Status: DC | PRN
  Administered 2013-09-03: 50 ug via INTRAVENOUS
  Administered 2013-09-03: 100 ug via INTRAVENOUS
  Administered 2013-09-03 (×4): 50 ug via INTRAVENOUS

## 2013-09-03 MED ORDER — SODIUM CHLORIDE 0.9 % IV SOLN
100.0000 mL/h | INTRAVENOUS | Status: DC
  Administered 2013-09-03: 16:00:00 100 mL/h via INTRAVENOUS
  Filled 2013-09-03 (×12): qty 1000

## 2013-09-03 MED ORDER — LIDOCAINE HCL (CARDIAC) 20 MG/ML IV SOLN
INTRAVENOUS | Status: AC
  Filled 2013-09-03: qty 5

## 2013-09-03 MED ORDER — ROCURONIUM BROMIDE 100 MG/10ML IV SOLN
INTRAVENOUS | Status: AC
  Filled 2013-09-03: qty 1

## 2013-09-03 MED ORDER — PROPOFOL 10 MG/ML IV BOLUS
INTRAVENOUS | Status: DC | PRN
  Administered 2013-09-03: 200 mg via INTRAVENOUS

## 2013-09-03 MED ORDER — CEFAZOLIN SODIUM-DEXTROSE 2-3 GM-% IV SOLR
2.0000 g | Freq: Four times a day (QID) | INTRAVENOUS | Status: AC
  Administered 2013-09-03 (×2): 2 g via INTRAVENOUS
  Filled 2013-09-03 (×2): qty 50

## 2013-09-03 MED ORDER — ALUM & MAG HYDROXIDE-SIMETH 200-200-20 MG/5ML PO SUSP: 30.0000 mL | ORAL | Status: DC | PRN

## 2013-09-03 MED ORDER — SACCHAROMYCES BOULARDII 250 MG PO CAPS
250.0000 mg | ORAL_CAPSULE | Freq: Every day | ORAL | Status: DC
  Administered 2013-09-03 – 2013-09-07 (×5): 250 mg via ORAL
  Filled 2013-09-03 (×5): qty 1

## 2013-09-03 MED ORDER — RIVAROXABAN 10 MG PO TABS
10.0000 mg | ORAL_TABLET | ORAL | Status: DC
  Administered 2013-09-04 – 2013-09-05 (×2): 10 mg via ORAL
  Filled 2013-09-03 (×3): qty 1

## 2013-09-03 MED ORDER — LACTATED RINGERS IV SOLN
INTRAVENOUS | Status: DC
  Administered 2013-09-03: 1000 mL via INTRAVENOUS
  Administered 2013-09-03: 11:00:00 via INTRAVENOUS

## 2013-09-03 MED ORDER — INSULIN ASPART 100 UNIT/ML ~~LOC~~ SOLN: 0.0000 [IU] | SUBCUTANEOUS | Status: DC

## 2013-09-03 SURGICAL SUPPLY — 64 items
ADH SKN CLS APL DERMABOND .7 (GAUZE/BANDAGES/DRESSINGS) ×1
AML 16.5 LRG 12/14 OFFSET (Hips) ×2 IMPLANT
BAG SPEC THK2 15X12 ZIP CLS (MISCELLANEOUS) ×1
BAG ZIPLOCK 12X15 (MISCELLANEOUS) ×2 IMPLANT
BLADE SAW SGTL 18X1.27X75 (BLADE) ×2 IMPLANT
BRUSH FEMORAL CANAL (MISCELLANEOUS) IMPLANT
CABLE (Orthopedic Implant) ×1 IMPLANT
CUP SECTOR GRIPTON 58MM (Orthopedic Implant) ×1 IMPLANT
DERMABOND ADVANCED (GAUZE/BANDAGES/DRESSINGS) ×1
DERMABOND ADVANCED .7 DNX12 (GAUZE/BANDAGES/DRESSINGS) ×1 IMPLANT
DRAPE INCISE IOBAN 85X60 (DRAPES) ×2 IMPLANT
DRAPE LG THREE QUARTER DISP (DRAPES) ×1 IMPLANT
DRAPE ORTHO SPLIT 77X108 STRL (DRAPES) ×4
DRAPE POUCH INSTRU U-SHP 10X18 (DRAPES) ×2 IMPLANT
DRAPE SURG 17X11 SM STRL (DRAPES) ×2 IMPLANT
DRAPE SURG ORHT 6 SPLT 77X108 (DRAPES) ×2 IMPLANT
DRAPE U-SHAPE 47X51 STRL (DRAPES) ×2 IMPLANT
DRSG AQUACEL AG ADV 3.5X10 (GAUZE/BANDAGES/DRESSINGS) IMPLANT
DRSG AQUACEL AG ADV 3.5X14 (GAUZE/BANDAGES/DRESSINGS) ×2 IMPLANT
DRSG EMULSION OIL 3X16 NADH (GAUZE/BANDAGES/DRESSINGS) ×1 IMPLANT
DRSG MEPILEX BORDER 4X4 (GAUZE/BANDAGES/DRESSINGS) ×1 IMPLANT
DRSG MEPILEX BORDER 4X8 (GAUZE/BANDAGES/DRESSINGS) ×1 IMPLANT
DRSG TEGADERM 4X4.75 (GAUZE/BANDAGES/DRESSINGS) ×1 IMPLANT
DURAPREP 26ML APPLICATOR (WOUND CARE) ×2 IMPLANT
ELECT BLADE TIP CTD 4 INCH (ELECTRODE) ×2 IMPLANT
ELECT REM PT RETURN 9FT ADLT (ELECTROSURGICAL) ×2
ELECTRODE REM PT RTRN 9FT ADLT (ELECTROSURGICAL) ×1 IMPLANT
EVACUATOR 1/8 PVC DRAIN (DRAIN) ×1 IMPLANT
FACESHIELD LNG OPTICON STERILE (SAFETY) ×8 IMPLANT
GAUZE SPONGE 2X2 8PLY STRL LF (GAUZE/BANDAGES/DRESSINGS) ×1 IMPLANT
GLOVE BIOGEL PI IND STRL 7.5 (GLOVE) ×1 IMPLANT
GLOVE BIOGEL PI IND STRL 8 (GLOVE) ×1 IMPLANT
GLOVE BIOGEL PI INDICATOR 7.5 (GLOVE) ×6
GLOVE BIOGEL PI INDICATOR 8 (GLOVE) ×1
GLOVE ECLIPSE 8.0 STRL XLNG CF (GLOVE) ×4 IMPLANT
GOWN BRE IMP PREV XXLGXLNG (GOWN DISPOSABLE) ×3 IMPLANT
GOWN PREVENTION PLUS LG XLONG (DISPOSABLE) ×2 IMPLANT
HANDPIECE INTERPULSE COAX TIP (DISPOSABLE)
HEAD CERAMIC DELTA 36 PLUS 1.5 (Hips) ×1 IMPLANT
HIP AML 16.5 LRG 12/14 OFFSET (Hips) IMPLANT
KIT BASIN OR (CUSTOM PROCEDURE TRAY) ×2 IMPLANT
LINER NEUTRAL 36X58 PLUS4 ×1 IMPLANT
MANIFOLD NEPTUNE II (INSTRUMENTS) ×2 IMPLANT
NS IRRIG 1000ML POUR BTL (IV SOLUTION) ×4 IMPLANT
PACK TOTAL JOINT (CUSTOM PROCEDURE TRAY) ×2 IMPLANT
POSITIONER SURGICAL ARM (MISCELLANEOUS) ×2 IMPLANT
PRESSURIZER FEMORAL UNIV (MISCELLANEOUS) IMPLANT
SCREW 6.5MMX35MM (Screw) ×1 IMPLANT
SCREW 6.5MMX40MM (Screw) ×1 IMPLANT
SET HNDPC FAN SPRY TIP SCT (DISPOSABLE) IMPLANT
SPONGE GAUZE 2X2 STER 10/PKG (GAUZE/BANDAGES/DRESSINGS)
SPONGE LAP 18X18 X RAY DECT (DISPOSABLE) ×1 IMPLANT
SPONGE LAP 4X18 X RAY DECT (DISPOSABLE) ×1 IMPLANT
STAPLER VISISTAT 35W (STAPLE) ×1 IMPLANT
SUCTION FRAZIER TIP 10 FR DISP (SUCTIONS) ×2 IMPLANT
SUT MNCRL AB 3-0 PS2 18 (SUTURE) ×1 IMPLANT
SUT VIC AB 1 CT1 36 (SUTURE) ×6 IMPLANT
SUT VIC AB 2-0 CT1 27 (SUTURE) ×4
SUT VIC AB 2-0 CT1 TAPERPNT 27 (SUTURE) ×3 IMPLANT
SUT VLOC 180 0 24IN GS25 (SUTURE) ×3 IMPLANT
TOWEL OR 17X26 10 PK STRL BLUE (TOWEL DISPOSABLE) ×4 IMPLANT
TOWER CARTRIDGE SMART MIX (DISPOSABLE) IMPLANT
TRAY FOLEY CATH 14FRSI W/METER (CATHETERS) ×2 IMPLANT
WATER STERILE IRR 1500ML POUR (IV SOLUTION) ×4 IMPLANT

## 2013-09-03 NOTE — Progress Notes (Signed)
[facility]  [facility] is providing the following services: RW and Commode  If patient discharges after hours, please call [phone].   Renard Hamper 09/03/2013, 4:45 PM

## 2013-09-03 NOTE — Brief Op Note (Signed)
09/03/2013  12:41 PM  PATIENT:  Thomas [name]  72 y.o. male  PRE-OPERATIVE DIAGNOSIS:  failed right total hip arthroplasty  POST-OPERATIVE DIAGNOSIS:  failed right total hip arthroplasty  PROCEDURE:  Procedure(s): RIGHT TOTAL HIP REVISION (Right)  SURGEON:  Surgeon(s) and Role:    * Shelda Pal, MD - Primary  PHYSICIAN ASSISTANT: Lanney Gins, PA-C  ANESTHESIA:   general  EBL:  Total I/O In: 2100 [I.V.:2100] Out: 1400 [Urine:500; Blood:900]  BLOOD ADMINISTERED:none  DRAINS: none   LOCAL MEDICATIONS USED:  NONE  SPECIMEN:  Source of Specimen:  right hip fluid  DISPOSITION OF SPECIMEN:  PATHOLOGY  COUNTS:  YES  TOURNIQUET:  * No tourniquets in log *  DICTATION: .Other Dictation: Dictation Number [id number]  PLAN OF CARE: Admit to inpatient   PATIENT DISPOSITION:  PACU - hemodynamically stable.   Delay start of Pharmacological VTE agent (>24hrs) due to surgical blood loss or risk of bleeding: no

## 2013-09-03 NOTE — Interval H&P Note (Signed)
History and Physical Interval Note:  09/03/2013 9:16 AM  Thomas [name]  has presented today for surgery, with the diagnosis of failed right total hip arthroplasty  The various methods of treatment have been discussed with the patient and family. After consideration of risks, benefits and other options for treatment, the patient has consented to  Procedure(s): RIGHT TOTAL HIP REVISION (Right) as a surgical intervention .  The patient's history has been reviewed, patient examined, no change in status, stable for surgery.  I have reviewed the patient's chart and labs.  Questions were answered to the patient's satisfaction.     Shelda Pal

## 2013-09-03 NOTE — Anesthesia Preprocedure Evaluation (Addendum)
Anesthesia Evaluation  Patient identified by MRN, date of birth, ID band Patient awake    Reviewed: Allergy & Precautions, H&P , NPO status , Patient's Chart, lab work & pertinent test results  Airway Mallampati: II TM Distance: >3 FB Neck ROM: full    Dental no notable dental hx.    Pulmonary neg pulmonary ROS, former smoker,  breath sounds clear to auscultation  Pulmonary exam normal       Cardiovascular hypertension, Pt. on medications + CAD, + Cardiac Stents and + Peripheral Vascular Disease Rhythm:regular Rate:Normal  AAA   Neuro/Psych Carotid artery occlusion. Meralgia paraesthetica negative neurological ROS  negative psych ROS   GI/Hepatic negative GI ROS, Neg liver ROS,   Endo/Other  diabetes, Well Controlled, Type 2, Insulin Dependent and Oral Hypoglycemic Agents  Renal/GU negative Renal ROS  negative genitourinary   Musculoskeletal   Abdominal   Peds  Hematology negative hematology ROS (+)   Anesthesia Other Findings   Reproductive/Obstetrics negative OB ROS                        Anesthesia Physical Anesthesia Plan  ASA: III  Anesthesia Plan: General   Post-op Pain Management:    Induction: Intravenous  Airway Management Planned: Oral ETT  Additional Equipment:   Intra-op Plan:   Post-operative Plan: Extubation in OR  Informed Consent: I have reviewed the patients History and Physical, chart, labs and discussed the procedure including the risks, benefits and alternatives for the proposed anesthesia with the patient or authorized representative who has indicated his/her understanding and acceptance.   Dental Advisory Given  Plan Discussed with: CRNA and Surgeon  Anesthesia Plan Comments:         Anesthesia Quick Evaluation

## 2013-09-03 NOTE — Preoperative (Signed)
Beta Blockers   Reason not to administer Beta Blockers:Not Applicable 

## 2013-09-03 NOTE — Progress Notes (Signed)
Dr. Charlann Boxer made aware of X-ray results- had seen films in PACU- no new orders obtained.

## 2013-09-03 NOTE — Progress Notes (Signed)
Portable AP Pelvis and Lateral Right Hip X-rays done. 

## 2013-09-03 NOTE — Progress Notes (Signed)
Utilization review completed.  

## 2013-09-03 NOTE — Progress Notes (Signed)
Dr. Leta Jungling made aware of patient's CBG results of 235- Orders to be written.

## 2013-09-03 NOTE — Transfer of Care (Signed)
Immediate Anesthesia Transfer of Care Note  Patient: Thomas [name]  Procedure(s) Performed: Procedure(s): RIGHT TOTAL HIP REVISION (Right)  Patient Location: PACU  Anesthesia Type:General  Level of Consciousness: awake, alert , oriented and patient cooperative  Airway & Oxygen Therapy: Patient Spontanous Breathing and Patient connected to face mask oxygen  Post-op Assessment: Report given to PACU RN, Post -op Vital signs reviewed and stable and Patient moving all extremities  Post vital signs: Reviewed and stable  Complications: No apparent anesthesia complications

## 2013-09-03 NOTE — Anesthesia Postprocedure Evaluation (Signed)
  Anesthesia Post-op Note  Patient: Thomas [name]  Procedure(s) Performed: Procedure(s) (LRB): RIGHT TOTAL HIP REVISION (Right)  Patient Location: PACU  Anesthesia Type: General  Level of Consciousness: awake and alert   Airway and Oxygen Therapy: Patient Spontanous Breathing  Post-op Pain: mild  Post-op Assessment: Post-op Vital signs reviewed, Patient's Cardiovascular Status Stable, Respiratory Function Stable, Patent Airway and No signs of Nausea or vomiting  Last Vitals:  Filed Vitals:   09/03/13 1348  BP:   Pulse: 84  Temp:   Resp: 18    Post-op Vital Signs: stable   Complications: No apparent anesthesia complications

## 2013-09-04 LAB — GLUCOSE, CAPILLARY
Glucose-Capillary: 155 mg/dL — ABNORMAL HIGH (ref 70–99)
Glucose-Capillary: 155 mg/dL — ABNORMAL HIGH (ref 70–99)
Glucose-Capillary: 219 mg/dL — ABNORMAL HIGH (ref 70–99)
Glucose-Capillary: 197 mg/dL — ABNORMAL HIGH (ref 70–99)

## 2013-09-04 LAB — BASIC METABOLIC PANEL
BUN: 12 mg/dL (ref 6–23)
CO2: 24 mEq/L (ref 19–32)
Chloride: 100 mEq/L (ref 96–112)
GFR calc non Af Amer: 89 mL/min — ABNORMAL LOW (ref >90)
Glucose, Bld: 190 mg/dL — ABNORMAL HIGH (ref 70–99)
Potassium: 4 mEq/L (ref 3.5–5.1)

## 2013-09-04 LAB — CBC
HCT: 28.4 % — ABNORMAL LOW (ref 39.0–52.0)
Hemoglobin: 9.9 g/dL — ABNORMAL LOW (ref 13.0–17.0)
MCHC: 34.9 g/dL (ref 30.0–36.0)
MCV: 87.1 fL (ref 78.0–100.0)
RBC: 3.26 MIL/uL — ABNORMAL LOW (ref 4.22–5.81)

## 2013-09-04 NOTE — Progress Notes (Signed)
09/04/13 1600  PT Visit Information  Last PT Received On 09/04/13  Assistance Needed +2  History of Present Illness right posterior hip revision  PT Time Calculation  PT Start Time 1610  PT Stop Time 1626  PT Time Calculation (min) 16 min  Precautions  Precautions Posterior Hip  Precaution Comments handout posted for THP;  pt unable to recall THP  Required Braces or Orthoses Knee Immobilizer - Right  Knee Immobilizer - Right (on in bed)  Restrictions  RLE Weight Bearing PWB  RLE Partial Weight Bearing Percentage or Pounds 50%  Cognition  Arousal/Alertness Awake/alert  Behavior During Therapy Restless  Overall Cognitive Status Impaired/Different from baseline  Area of Impairment Orientation;Following commands;Safety/judgement;Awareness  Orientation Level Disoriented to;Time;Situation  Memory Decreased recall of precautions  Following Commands Follows one step commands inconsistently  Safety/Judgement Decreased awareness of safety;Decreased awareness of deficits  General Comments pt continues to have difficulty focusing on task, following basic commands  Bed Mobility  Bed Mobility Sit to Supine  Sit to Supine 1: +2 Total assist;HOB flat  Sit to Supine: Patient Percentage 60%  Details for Bed Mobility Assistance multi-modal cues for THP  Transfers  Transfers Sit to Stand;Stand to Sit  Sit to Stand From chair/3-in-1;1: +2 Total assist  Sit to Stand: Patient Percentage 50%  Stand to Sit 1: +2 Total assist;To bed  Stand to Sit: Patient Percentage 30%  Details for Transfer Assistance multi-modal cues for hand placement and THP as well as to control descent and ensure overall safety  Ambulation/Gait  Ambulation/Gait Assistance 1: +2 Total assist  Ambulation/Gait: Patient Percentage 50%  Ambulation Distance (Feet) 9 Feet  Assistive device Rolling walker  Ambulation/Gait Assistance Details attempted gait this pm as pt expressed desire to amb; pt requiring increased cues and more  assist for safety and to prevent falls  Gait Pattern Step-to pattern;Antalgic;Trunk flexed;Narrow base of support  Gait velocity slow  General Gait Details pt extremely unsafe, moderately impulsive,  with increased UE fatigue  PT - End of Session  Equipment Utilized During Treatment Gait belt;Right knee immobilizer  Activity Tolerance Patient limited by fatigue;Patient limited by pain  Patient left in bed;with call bell/phone within reach;with family/visitor present  Nurse Communication Mobility status  PT - Assessment/Plan  PT Plan Current plan remains appropriate  PT Frequency 7X/week  Follow Up Recommendations SNF  PT equipment None recommended by PT  PT Goal Progression  Progress towards PT goals Progressing toward goals  Acute Rehab PT Goals  Time For Goal Achievement 09/11/13  Potential to Achieve Goals Good  PT General Charges  $$ ACUTE PT VISIT 1 Procedure  PT Treatments  $Gait Training 8-22 mins

## 2013-09-04 NOTE — Progress Notes (Signed)
Pt's temperature was 102.4. Called pa on call and rec'd order for Tylenol. Encourage deep breathing and incentive spirometer, but with pt's lethargy and drowsiness, pt unable to do. Will continue to monitor pt.

## 2013-09-04 NOTE — Evaluation (Signed)
Physical Therapy Evaluation Patient Details Name: Thomas [name] MRN: [id number] DOB: December 13, 1940 Today's Date: 09/04/2013 Time: 4098-1191 PT Time Calculation (min): 26 min  PT Assessment / Plan / Recommendation History of Present Illness  right posterior hip revision  Clinical Impression  Pt will benefit from PT to maximize I/address deficits below; Will need STSNF post acute    PT Assessment  Patient needs continued PT services    Follow Up Recommendations  SNF    Does the patient have the potential to tolerate intense rehabilitation      Barriers to Discharge        Equipment Recommendations  None recommended by PT    Recommendations for Other Services     Frequency 7X/week    Precautions / Restrictions Precautions Precautions: Posterior Hip Restrictions Weight Bearing Restrictions: Yes RLE Weight Bearing: Partial weight bearing RLE Partial Weight Bearing Percentage or Pounds: 50%   Pertinent Vitals/Pain C/o pain, premedicated      Mobility  Bed Mobility Bed Mobility: Supine to Sit Supine to Sit: 1: +2 Total assist Supine to Sit: Patient Percentage: 60% Details for Bed Mobility Assistance: multi-modal cues for THP Transfers Transfers: Sit to Stand;Stand to Sit Sit to Stand: 3: Mod assist Stand to Sit: 1: +2 Total assist Stand to Sit: Patient Percentage: 50% Details for Transfer Assistance: multi-modal cues for hand placement and THP as well as to control descent and ensure overall safety Ambulation/Gait Ambulation/Gait Assistance: 1: +2 Total assist Ambulation/Gait: Patient Percentage: 70% Ambulation Distance (Feet): 40 Feet Assistive device: Rolling walker Ambulation/Gait Assistance Details: cues for sequence, RW safety; pt is impulsive and requires +2 for safety throughout/line management Gait Pattern: Step-to pattern    Exercises     PT Diagnosis: Difficulty walking  PT Problem List: Decreased strength;Decreased range of motion;Decreased activity  tolerance;Decreased balance;Decreased mobility;Pain;Decreased cognition;Decreased knowledge of use of DME PT Treatment Interventions: DME instruction;Gait training;Functional mobility training;Therapeutic activities;Therapeutic exercise;Patient/family education     PT Goals(Current goals can be found in the care plan section) Acute Rehab PT Goals Patient Stated Goal: I PT Goal Formulation: With patient Time For Goal Achievement: 09/11/13 Potential to Achieve Goals: Good  Visit Information  Last PT Received On: 09/04/13 Assistance Needed: +2 History of Present Illness: right posterior hip revision       Prior Functioning  Home Living Family/patient expects to be discharged to:: Skilled nursing facility Living Arrangements: Spouse/significant other Prior Function Level of Independence: Needs assistance Gait / Transfers Assistance Needed: amb with RW and wife's assist ADL's / Homemaking Assistance Needed: assist for last 6 weeks Comments: has had falls at home    Cognition  Cognition Arousal/Alertness: Awake/alert Behavior During Therapy: Restless Overall Cognitive Status: Impaired/Different from baseline Area of Impairment: Orientation;Following commands;Safety/judgement;Awareness Orientation Level: Disoriented to;Situation;Time Following Commands: Follows one step commands inconsistently Safety/Judgement: Decreased awareness of safety;Decreased awareness of deficits    Extremity/Trunk Assessment Upper Extremity Assessment Upper Extremity Assessment: Defer to OT evaluation Lower Extremity Assessment Lower Extremity Assessment: RLE deficits/detail RLE: Unable to fully assess due to pain   Balance    End of Session PT - End of Session Equipment Utilized During Treatment: Gait belt Activity Tolerance: Patient tolerated treatment well Patient left: in chair;with call bell/phone within reach;with chair alarm set;with family/visitor present Nurse Communication: Mobility status   GP     [facility] 09/04/2013, 3:09 PM

## 2013-09-04 NOTE — Progress Notes (Addendum)
Patient ID: Thomas [name], male   DOB: [birth year], 72 y.o.   MRN: [id number] Subjective: 1 Day Post-Op Procedure(s) (LRB): RIGHT TOTAL HIP REVISION (Right)    Patient reports pain as moderate.  Thomas [name] lacks tolerance of pain and has been on high dose pain meds for some time now.  This will make this a challenge in post-operative period.  Due to anxiety associated with his pain will be using ativan to calm him down, I worry about hi underlying coping mechanism.  Objective:   VITALS:   Filed Vitals:   09/04/13 0528  BP: 156/72  Pulse: 96  Temp: 99.4 F (37.4 C)  Resp: 18    Neurovascular intact Incision: dressing C/D/I  LABS  Recent Labs  09/04/13 0440  HGB 9.9*  HCT 28.4*  WBC 7.7  PLT 172     Recent Labs  09/04/13 0440  NA 134*  K 4.0  BUN 12  CREATININE 0.78  GLUCOSE 190*    No results found for this basename: LABPT, INR,  in the last 72 hours   Assessment/Plan: 1 Day Post-Op Procedure(s) (LRB): RIGHT TOTAL HIP REVISION (Right)   Advance diet  Up with therapy as tolerable today  Discharge to SNF when ready as his wife is going to be unable to safely manage him at home  Due to condition and lack of tolerance to initial post-operative period we will maintain foley to better monitor output

## 2013-09-04 NOTE — Op Note (Signed)
Thomas [name], Thomas [name] NO.:  [id number]  MEDICAL RECORD NO.:  [id number]  LOCATION:  1613                         FACILITY:  [facility]  PHYSICIAN:  Madlyn Frankel. Charlann Boxer, M.D.  DATE OF BIRTH:  10-Mar-1941  DATE OF PROCEDURE:  09/03/2013 DATE OF DISCHARGE:                              OPERATIVE REPORT   PREOPERATIVE DIAGNOSIS:  Failed right total hip arthroplasty.  POSTOPERATIVE DIAGNOSIS:  Failed right total hip arthroplasty.  PROCEDURE:  Revision right total hip arthroplasty utilizing DePuy hip system with a size 58 Gription and Pinnacle shell, 36+4 neutral ALTRX liner, a size 16.5 AML large stature prosthetic stem with a 36+1.5 delta ceramic ball, and a single Zimmer cable.  SURGEON:  Madlyn Frankel. Charlann Boxer, M.D.  ASSISTANT:  Lanney Gins, PA-C.  Note that, Thomas [name] was present for the entirety of the case from preoperative position, perioperative management, operative extremity, general facilitation of the case, primary wound closure.  ANESTHESIA:  General.  SPECIMENS:  Fluid from the joint was taken at the time of the capsulotomy.  DRAINS:  None.  ESTIMATED BLOOD LOSS:  About 900 mL.  INDICATIONS FOR PROCEDURE:  Thomas [name] with history of right total hip arthroplasty performed by referring physician with a modular neck femoral component.  He had presented and had been followed in the clinic for several years with pain over the hip girdle. Workup was always nonspecific as well as clinical presentation. Radiographically his femoral stem appeared to be in a bit varus and there was no evidence any remodeling distally of his radiographs.  No evidence of any loosening of the proximal aspect of this femoral component.  For years, we had been watching and observing his symptoms and clinical and radiographic workup without evidence of any complicating features.  He has required chronic pain meds for some lower spine issues,  recent evaluation through a pain clinic revealed on his hip including MRI revealing fluid collection inside the hip joint.  Workup at that time indicated no evidence of any infection with normal sedimentation rate, C- reactive protein.  Given these findings and his pain, at this point, he wished to have this hip revised that always counseled against revision surgery due to the potential morbidity associated with removing a well-fixed femoral component, and not exactly or without a specific etiology of this pain and I did not want to inflict more problems than good.  Despite this discussion we had in the past, given his current presentation and the findings by MRI, he at this point was ready to accept the risks of revision surgery including infection, DVT, increased risk of dislocation, persistent pain, or new pain following the surgery for the benefit of pain relief.  PROCEDURE IN DETAIL:  The patient was brought to operative theater. Once adequate anesthesia, preoperative antibiotics and Ancef was administered.  The patient was positioned in the left lateral decubitus position with the right side up.  The right lower extremity was then prepped and draped in sterile fashion.  Time-out was performed identifying the patient, planned procedure, and extremity.  The patient's old incision had been identified and a portion of the incision was utilized.  Soft tissue  dissection was carried to the iliotibial band and gluteal fascia was within the split for posterior approach to the hip.  Old Ethibond sutures were removed as identified.  As I opened the patient's posterior capsule, identified a inflammatory type of tissue.  Fluid did not appear to be purulent.  Nonetheless cultures were taken.  There was no metal staining.  There was no muscle damage.  Once the posterior 2/3rd of the hip was exposed and I removed the femoral head.  He was noted to have a old Stryker modular femoral component.   We, at this point, I spent time using thin flexible osteotomes undermining the bone prosthetic interface proximally in all 4 directions.  Once I felt that I had the hip adequately exposed or the prosthetic component adequately exposed, I used a s-ROM loop extractor and tried remove the implant with the modular neck.  However, attempting to remove the femoral component disengaged the modular neck from the stem.  At this point, we continued to work on trying to expose the created junction between the prosthetic bone interface.  We did identify that during this process basically a  trochanteric osteotomy was made just below the lesser trochanter.  This allowed for easy removal of the femoral component.  The femoral component was eventually removed without further complicating issues.  Once the femoral component was out, I pinned it to the  acetabular component, evaluating the acetabular component indicated a slightly larger abduction angle of greater than 50 degrees in a neutral anteverted position.  I removed the acetabular liner.  Then, the 2 screws within the acetabular shell.  The liner was then replaced using the Innomed explant system, the acetabular shell was removed without difficulty.  Following acetabular exposure, I began reaming with a 54 reamer reamed to a 56 reamer and selected 58 pinnacle Gription cup.  The cup was then impacted in good initial scratch fit.  Utilizing the acetabular cup position guide, I felt that the patient's abduction ankle was closed down to about 35-40 degrees and anteversion was about 20 degrees.  Two cancellous screws were placed into the ilium.  A trial liner was then positioned, 36+4 neutral liner.  At this point, attention was now directed to the femoral preparation, given the orientation of the old femoral stem.  There was noted to be bone pedestal distally.  We were able to bypass the pedestal utilizing the [facility] drills.  I confirmed  this by passing a drill bit all the way down to the knee confirming that I was inside the canal.  I then began reaming with the 8 mm reamers for the AML system and reamed up to 16.0 mm with good cortical bone contact distally.  I then broached initially with a #15 small body and then 16.5 small body, and did a trial reduction.  With a trial neck and head in place, a plain film was taken of the pelvis and femur to confirm the location of the femoral stem.  We will evaluate for any further fractures or complicating features and none were identified. Given these findings during the trial reduction, I did go ahead and place a Zimmer cable just beneath the lesser trochanter and around the basically osteotomized trochanter.  This was loosely tensioned.  I selected increasing the small stature to the large stature stem and broach with this and found that this allowed for further stability proximally and allowed for me to keep the stem little bit more proud than the small stature.  At this point, a 16.5 large stature AML stem was opened and a final 36+4 neutral ALTRX liner was opened. Trial components were removed.  The hip was irrigated a final 36+4 neutral ALTRX liner was impacted with good secure visualized rim fit. The final 16.5 stem was then impacted maintaining orientation of anteversion using a reamer to about 20 degrees of anteversion.  It was impacted just at the level of where the broach was, if not a little bit more proud at.  I thus selected a trial of 36+1.5 ball and found that the leg lengths at this point were equal.  There was no evidence of impingement with forward flexion and internal rotation.  No evidence of impingement with extension and external rotation.  Given these findings, the final 36+1.5 delta ceramic ball was selected and impacted on the clean and dry trunnion.  The hip was reduced.  The hip had been irrigated throughout the case at this point.  I  then reapproximated the posterior capsule to the sheath superior capsule using #1 Vicryl.  I reapproximated the iliotibial band and gluteal fascia using #1 Vicryl and 0 V-Loc suture.  The remainder of the wound was closed 2-0 Vicryl and running 3-0 Monocryl.  The hip was cleaned, dried, and dressed sterilely using Dermabond and Aquacel dressing.  No drain was utilized.  He was brought to the recovery room, extubated in stable condition, and tolerated the procedure well.  I will probably have him be partial weightbearing until I am certain that he is making some progress.  I will limit active abduction as a form of exercise form, but otherwise activity will not be restricted.     Madlyn Frankel Charlann Boxer, M.D.     MDO/MEDQ  D:  09/03/2013  T:  09/04/2013  Job:  [id number]

## 2013-09-05 ENCOUNTER — Encounter (HOSPITAL_COMMUNITY): Payer: Self-pay | Admitting: Physician Assistant

## 2013-09-05 DIAGNOSIS — I10 Essential (primary) hypertension: Secondary | ICD-10-CM | POA: Insufficient documentation

## 2013-09-05 DIAGNOSIS — I2510 Atherosclerotic heart disease of native coronary artery without angina pectoris: Secondary | ICD-10-CM | POA: Insufficient documentation

## 2013-09-05 DIAGNOSIS — E119 Type 2 diabetes mellitus without complications: Secondary | ICD-10-CM | POA: Insufficient documentation

## 2013-09-05 DIAGNOSIS — I4892 Unspecified atrial flutter: Secondary | ICD-10-CM | POA: Diagnosis not present

## 2013-09-05 LAB — BASIC METABOLIC PANEL
CO2: 25 mEq/L (ref 19–32)
Chloride: 100 mEq/L (ref 96–112)
Creatinine, Ser: 0.8 mg/dL (ref 0.50–1.35)
GFR calc Af Amer: >90 mL/min (ref >90)
Glucose, Bld: 158 mg/dL — ABNORMAL HIGH (ref 70–99)
Potassium: 3.5 mEq/L (ref 3.5–5.1)
Sodium: 136 mEq/L (ref 135–145)

## 2013-09-05 LAB — CBC
Hemoglobin: 8.9 g/dL — ABNORMAL LOW (ref 13.0–17.0)
MCV: 86.7 fL (ref 78.0–100.0)
Platelets: 150 10*3/uL (ref 150–400)
RBC: 3 MIL/uL — ABNORMAL LOW (ref 4.22–5.81)
RDW: 12.4 % (ref 11.5–15.5)
WBC: 8.5 10*3/uL (ref 4.0–10.5)

## 2013-09-05 LAB — GLUCOSE, CAPILLARY
Glucose-Capillary: 168 mg/dL — ABNORMAL HIGH (ref 70–99)
Glucose-Capillary: 215 mg/dL — ABNORMAL HIGH (ref 70–99)
Glucose-Capillary: 158 mg/dL — ABNORMAL HIGH (ref 70–99)

## 2013-09-05 MED ORDER — METOPROLOL TARTRATE 1 MG/ML IV SOLN
2.5000 mg | INTRAVENOUS | Status: DC | PRN
  Administered 2013-09-05: 2.5 mg via INTRAVENOUS

## 2013-09-05 MED ORDER — METOPROLOL TARTRATE 1 MG/ML IV SOLN
2.5000 mg | INTRAVENOUS | Status: DC | PRN
  Administered 2013-09-05: 5 mg via INTRAVENOUS
  Administered 2013-09-05: 17:00:00 2.5 mg via INTRAVENOUS
  Administered 2013-09-06 (×2): 5 mg via INTRAVENOUS
  Administered 2013-09-06 (×2): 2.5 mg via INTRAVENOUS
  Filled 2013-09-05 (×6): qty 5

## 2013-09-05 MED ORDER — METOPROLOL TARTRATE 1 MG/ML IV SOLN
INTRAVENOUS | Status: AC
  Filled 2013-09-05: qty 5

## 2013-09-05 MED ORDER — RIVAROXABAN 10 MG PO TABS
10.0000 mg | ORAL_TABLET | Freq: Once | ORAL | Status: AC
  Administered 2013-09-05: 19:00:00 10 mg via ORAL
  Filled 2013-09-05: qty 1

## 2013-09-05 MED ORDER — METHOCARBAMOL 100 MG/ML IJ SOLN
500.0000 mg | Freq: Four times a day (QID) | INTRAVENOUS | Status: DC
  Filled 2013-09-05 (×10): qty 5

## 2013-09-05 MED ORDER — LORAZEPAM 2 MG/ML IJ SOLN
0.5000 mg | Freq: Four times a day (QID) | INTRAMUSCULAR | Status: DC | PRN
  Administered 2013-09-05: 16:00:00 0.5 mg via INTRAVENOUS

## 2013-09-05 MED ORDER — DILTIAZEM LOAD VIA INFUSION: 10.0000 mg | Freq: Once | INTRAVENOUS | Status: DC

## 2013-09-05 MED ORDER — DILTIAZEM LOAD VIA INFUSION
10.0000 mg | Freq: Once | INTRAVENOUS | Status: AC
  Administered 2013-09-05: 19:00:00 10 mg via INTRAVENOUS
  Filled 2013-09-05: qty 10

## 2013-09-05 MED ORDER — DILTIAZEM HCL 100 MG IV SOLR
5.0000 mg/h | INTRAVENOUS | Status: AC
  Administered 2013-09-05: 5 mg/h via INTRAVENOUS
  Administered 2013-09-06 (×2): 15 mg/h via INTRAVENOUS
  Filled 2013-09-05: qty 100

## 2013-09-05 MED ORDER — DILTIAZEM HCL 100 MG IV SOLR: 5.0000 mg/h | INTRAVENOUS | Status: DC

## 2013-09-05 MED ORDER — RIVAROXABAN 20 MG PO TABS
20.0000 mg | ORAL_TABLET | ORAL | Status: DC
  Administered 2013-09-06: 20 mg via ORAL
  Filled 2013-09-05 (×2): qty 1

## 2013-09-05 MED ORDER — METHOCARBAMOL 500 MG PO TABS
500.0000 mg | ORAL_TABLET | Freq: Four times a day (QID) | ORAL | Status: DC
  Administered 2013-09-06 – 2013-09-07 (×4): 500 mg via ORAL
  Filled 2013-09-05 (×10): qty 1

## 2013-09-05 NOTE — Progress Notes (Signed)
Thomas [name], Cardiology PA at bedside.  2nd dose of 2.5mg  IV Lopressor just administered, 0.5mg  of ativan IV given per orders.  Patient BP stable, now asleep with wife at bedside.  HR SVT irregular, 140s-50s.  Will continue to monitor.

## 2013-09-05 NOTE — Care Management Note (Signed)
    Page 1 of 1   09/05/2013     12:17:05 PM   CARE MANAGEMENT NOTE 09/05/2013  Patient:  Thomas [name], Thomas [name]   Account Number:  [id number]  Date Initiated:  09/05/2013  Documentation initiated by:  Colleen Can  Subjective/Objective Assessment:   dx failed rt total hip arthroplasty; Revision rt total hip arthroplasty     Action/Plan:   Plans are for SNF rehab   Anticipated DC Date:  09/07/2013   Anticipated DC Plan:  SKILLED NURSING FACILITY  In-house referral  Clinical Social Worker      DC Planning Services  CM consult      Choice offered to / List presented to:             Status of service:  Completed, signed off Medicare Important Message given?   (If response is "NO", the following Medicare IM given date fields will be blank) Date Medicare IM given:   Date Additional Medicare IM given:    Discharge Disposition:    Per UR Regulation:    If discussed at Long Length of Stay Meetings, dates discussed:    Comments:

## 2013-09-05 NOTE — Progress Notes (Signed)
1430 patient hear rate 151 b/p 104/60patientquiet and in bed n/ochest pain, ekg done and called matt babish PA  [employer]

## 2013-09-05 NOTE — Progress Notes (Signed)
Patient has a bed at [facility] for discharge Friday.   Clinical Social Work Department BRIEF PSYCHOSOCIAL ASSESSMENT 09/05/2013  Patient:  Thomas [name], Thomas [name]     Account Number:  [id number]     Admit date:  09/03/2013  Clinical Social Worker:  Orpah Greek  Date/Time:  09/05/2013 02:28 PM  Referred by:  Physician  Date Referred:  09/05/2013 Referred for  SNF Placement   Other Referral:   Interview type:  Patient Other interview type:   and wife, Myriam Jacobson at bedside    PSYCHOSOCIAL DATA Living Status:  WIFE Admitted from facility:   Level of care:   Primary support name:  Jedaiah Rathbun (wife) h#: [phone] c#: [phone] Primary support relationship to patient:  SPOUSE Degree of support available:   good    CURRENT CONCERNS Current Concerns  Post-Acute Placement   Other Concerns:    SOCIAL WORK ASSESSMENT / PLAN CSW received consult for SNF placement.   Assessment/plan status:  Information/Referral to [facility] Other assessment/ plan:   Information/referral to community resources:   CSW completed FL2 and faxed information to [facility], confirmed with Carney Bern @ [facility] that they would be able to take patient on Friday (not Thursday, due to holiday).    PATIENT'S/FAMILY'S RESPONSE TO PLAN OF CARE: Patient & wife were very pleased to hear that [facility] would be able to take patient on Friday. Per wife, the facility "isn't but a block away from their house". CSW will follow-up Friday for discharge.        Unice Bailey, LCSW [facility] Clinical Social Worker cell #: [phone]

## 2013-09-05 NOTE — Progress Notes (Signed)
Patient has a bed at [facility] for discharge Friday.  Clinical Social Work Department CLINICAL SOCIAL WORK PLACEMENT NOTE 09/05/2013  Patient:  Thomas [name], Thomas [name]  Account Number:  [id number] Admit date:  09/03/2013  Clinical Social Worker:  Orpah Greek  Date/time:  09/05/2013 02:55 PM  Clinical Social Work is seeking post-discharge placement for this patient at the following level of care:   SKILLED NURSING   (*CSW will update this form in Epic as items are completed)   09/05/2013  Patient/family provided with [facility] Department of Clinical Social Work's list of facilities offering this level of care within the geographic area requested by the patient (or if unable, by the patient's family).  09/05/2013  Patient/family informed of their freedom to choose among providers that offer the needed level of care, that participate in Medicare, Medicaid or managed care program needed by the patient, have an available bed and are willing to accept the patient.  09/05/2013  Patient/family informed of [facility]' ownership interest in [facility], as well as of the fact that they are under no obligation to receive care at this facility.  PASARR submitted to EDS on 09/05/2013 PASARR number received from EDS on 09/05/2013  FL2 transmitted to all facilities in geographic area requested by pt/family on  09/05/2013 FL2 transmitted to all facilities within larger geographic area on   Patient informed that his/her managed care company has contracts with or will negotiate with  certain facilities, including the following:     Patient/family informed of bed offers received:  09/05/2013 Patient chooses bed at [facility] Physician recommends and patient chooses bed at    Patient to be transferred to [facility] on  09/07/2013 Patient to be transferred to facility by   The following physician request were entered in Epic:   Additional  Comments:   Unice Bailey, LCSW [facility] Clinical Social Worker cell #: [phone]

## 2013-09-05 NOTE — Progress Notes (Signed)
1510 report called to Asencion Gowda on [address] west   D [employer]

## 2013-09-05 NOTE — Consult Note (Signed)
CARDIOLOGY CONSULT NOTE   Patient ID: Thomas [name] MRN: [id number] DOB/AGE: September 11, 1941 72 y.o.  Admit date: 09/03/2013  Primary Physician   [name], MD Primary Cardiologist   JV/GT Reason for Consultation   Rapid atrial fib  Thomas [name]:WJXBJ R Mazzie is a 72 y.o. male with a history of CAD s/p stenting and atrial flutter, S./P. Ablation.   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 He was admitted on 09/03/2013 for right THR revision. His pain has been poorly controlled since the procedure, as it was for the previous several years. He has struggled with this. Per his wife and the patient, he has poor by mouth intake since the surgery due to the pain. The pain is constantly a 10/10, despite significant narcotic prescription. He is aware that he tenses up times when the pain is especially bad but cannot help it.  With all of this, he has had no chest pain and no palpitations. He has had no shortness of breath. However, at approximately 2:15 PM today, he was noted to have a heart rate greater than 150. He was transferred to telemetry. 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 His heart rate was initially regular, concerning for SVT. However, he had brief slowing after the Lopressor during which flutter waves can be seen.   [name] is still completely unaware that his heart is out of rhythm. When he was previously in atrial flutter, he could feel the palpitations. However, possibly because of the severe pain he is constantly in, he does not feel any palpitations at all. He was given Lopressor 2.5 mg IV x2 doses and his heart rate would slow temporarily but he has not converted. He was also given Ativan 0.5 mg IV to see if relaxing him would help slow his heart rate. Other than the pain in his hip, he has no complaints.   Past Medical History  Diagnosis Date  . Diabetes mellitus   . CAD (coronary artery disease)   . Hypertension   . Claudication in  peripheral vascular disease 07/08/2011    Left external iliac occlusion with collaterals; mild-moderate right external iliac disease; moderate calcified left popliteal disease  . Carotid artery disease 2012    < 50% stenosis bilaterally  . Hyperlipidemia   . AAA (abdominal aortic aneurysm)   . Chronic kidney disease     hx kidney stones  . Arthritis   . 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 Shoulder surgery      right  . Carpal tunnel release  2006    right   . Hip fracture surgery  11/2007    right  . Joint replacement  2009    right total hip replacement  . Coronary angioplasty with stent placement  05/30/2007    Mid RCA 3.0 x 15 mm vision BMS, distal RCA 2.5 x 12 mm vision BMS, PDA 2.0 x 12 mm vision BMS  . Tonsillectomy      as child  . Total hip revision Right 09/03/2013    Procedure: RIGHT TOTAL HIP REVISION;  Surgeon: Shelda Pal, MD;  Location: [facility] ORS;  Service: Orthopedics;  Laterality: Right;  . Atrial flutter ablation  11/03/2006  . Coronary angioplasty with stent placement  10/06/2007    LM 25, LAD 25, D1 50, D2 50, CFX mild dz, OM1  diffuse dz, OM 2 100 w/ L-L collaterals, mid-RCA stent with mild ISR, distal RCA stent with > 90 ISR, subtotal PL, Rx w/ 2.5 x 15-mm Promus DES. EF 55%,     Allergies  Allergen Reactions  . Nsaids Diarrhea    I have reviewed the patient's current medications . atorvastatin  20 mg Oral q1800  . docusate sodium  100 mg Oral BID  . ferrous sulfate  325 mg Oral TID PC  . gabapentin  300 mg Oral TID  . glipiZIDE  10 mg Oral Q breakfast  . insulin aspart  0-15 Units Subcutaneous TID WC  . insulin glargine  60 Units Subcutaneous Q2200  . isosorbide mononitrate  30 mg Oral q morning - 10a  . labetalol  100 mg Oral BID  . metFORMIN  1,000 mg Oral Q breakfast  . morphine  60 mg Oral Q12H  . oxyCODONE  20 mg Oral Q4H  . polyethylene glycol  17 g Oral BID   . rivaroxaban  10 mg Oral Q24H  . saccharomyces boulardii  250 mg Oral Daily   . sodium chloride 0.9 % 1,000 mL with potassium chloride 10 mEq infusion Stopped (09/04/13 1400)   acetaminophen, alum & mag hydroxide-simeth, bisacodyl, diphenhydrAMINE, HYDROmorphone (DILAUDID) injection, LORazepam, LORazepam, LORazepam, menthol-cetylpyridinium, methocarbamol (ROBAXIN) IV, methocarbamol, metoCLOPramide (REGLAN) injection, metoCLOPramide, metoprolol, nitroGLYCERIN, ondansetron (ZOFRAN) IV, ondansetron, phenol, zolpidem  Prior to Admission medications   Medication Sig Start Date End Date Taking? Authorizing Provider  aspirin EC 81 MG tablet Take 81 mg by mouth daily.    Yes Historical Provider, MD  fenofibrate micronized (LOFIBRA) 134 MG capsule Take 134 mg by mouth daily before breakfast.    Yes Historical Provider, MD  fish oil-omega-3 fatty acids 1000 MG capsule Take 1 g by mouth daily.   Yes Historical Provider, MD  gabapentin (NEURONTIN) 300 MG capsule Take 300 mg by mouth 3 (three) times daily. 1 am, 1 lunch, 1 bedtime   Yes Historical Provider, MD  glipiZIDE (GLUCOTROL) 10 MG tablet Take 10 mg by mouth daily.    Yes Historical Provider, MD  HUMALOG KWIKPEN 100 UNIT/ML SOPN Inject 30 Units as directed every morning.  05/21/13  Yes Historical Provider, MD  insulin glargine (LANTUS) 100 UNIT/ML injection Inject 60 Units into the skin every morning.    Yes Historical Provider, MD  isosorbide mononitrate (IMDUR) 30 MG 24 hr tablet Take 30 mg by mouth every morning.  11/28/12  Yes Everette Rank, MD  lisinopril (PRINIVIL,ZESTRIL) 40 MG tablet Take 40 mg by mouth every morning.    Yes Everette Rank, MD  metFORMIN (GLUCOPHAGE) 500 MG tablet Take 1,000 mg by mouth daily.    Yes Historical Provider, MD  morphine (MS CONTIN) 30 MG 12 hr tablet Take 60 mg by mouth every 12 (twelve) hours.    Yes Historical Provider, MD  oxycodone (OXY-IR) 5 MG capsule Take 20 mg by mouth every 4 (four) hours as needed for  pain.    Yes Historical Provider, MD  rosuvastatin (CRESTOR) 10 MG tablet Take 10 mg by mouth every morning.    Yes Historical Provider, MD  VOLTAREN 1 % GEL apply 2 grams to affected area four times a day 07/28/13  Yes Su Monks, PA-C  zolpidem (AMBIEN) 10 MG tablet Take 10 mg by mouth at bedtime as needed for sleep.    Yes Historical Provider, MD  APIDRA SOLOSTAR 100 UNIT/ML injection Inject 15 Units as directed every morning.  12/18/12   Historical Provider,  MD  nitroGLYCERIN (NITROSTAT) 0.4 MG SL tablet Place 0.4 mg under the tongue every 5 (five) minutes as needed.     Historical Provider, MD  saccharomyces boulardii (FLORASTOR) 250 MG capsule Take 250 mg by mouth daily.    Historical Provider, MD     History   Social History  . Marital Status: Married    Spouse Name: N/A    Number of Children: N/A  . Years of Education: N/A   Occupational History  . Retired    Social History Main Topics  . Smoking status: Former Smoker    Types: Cigarettes    Quit date: 10/11/2005  . Smokeless tobacco: Never Used  . Alcohol Use: Yes     Comment: occasionally  . Drug Use: No  . Sexual Activity: Not on file   Other Topics Concern  . Not on file   Social History Narrative  . No narrative on file    Family Status  Relation Status Death Age  . Mother Deceased   . Father Deceased   . Son Alive    Family History  Problem Relation Age of Onset  . Emphysema Mother   . Cancer Father     Brain  . Diabetes Son      ROS:  Full 14 point review of systems complete and found to be negative unless listed above.  Physical Exam: Blood pressure 114/52, pulse 138, temperature 99.4 F (37.4 C), temperature source Oral, resp. rate 18, height 5\' 11"  (1.803 m), weight 186 lb (84.369 kg), SpO2 98.00%.  General: Well developed, well nourished, male in acute distress Head: Eyes PERRLA, No xanthomas.   Normocephalic and atraumatic, oropharynx without edema or exudate. Dentition: Poor Lungs:  Essentially clear to auscultation bilaterally Heart: Heart irregular rate and rhythm with S1, S2, no murmur. pulses are 2+ all 4 extrem.   Neck: No carotid bruits. No lymphadenopathy.  JVD not elevated. Abdomen: Bowel sounds present, abdomen soft and non-tender without masses or hernias noted. Msk:  No spine or cva tenderness. No weakness, no joint deformities or effusions. Extremities: No clubbing or cyanosis. No edema. Right lower extremity is immobilized in the incision is dressed, not disturbed Neuro: Alert and oriented X 3. No focal deficits noted. Psych:  Appears anxious and acutely uncomfortable, responds appropriately Skin: No rashes or lesions noted.  Labs:   Lab Results  Component Value Date   WBC 8.5 09/05/2013   HGB 8.9* 09/05/2013   HCT 26.0* 09/05/2013   MCV 86.7 09/05/2013   PLT 150 09/05/2013     Recent Labs Lab 09/05/13 0420  NA 136  K 3.5  CL 100  CO2 25  BUN 13  CREATININE 0.80  CALCIUM 8.7  GLUCOSE 158*   Lipase  Date/Time Value Range Status  10/07/2010  6:19 PM 17  11 - 59 U/L Final   TSH  Date/Time Value Range Status  10/08/2010 11:03 AM 2.165  0.350 - 4.500 uIU/mL Final    Echo: 11/01/2006 SUMMARY - Overall left ventricular systolic function was at the lower limits of normal. Left ventricular ejection fraction was estimated , range being 50 % to 55 %. Although no diagnostic left ventricular regional wall motion abnormality was identified, this possibility cannot be completely excluded on the basis of this study. Left ventricular wall thickness was mildly increased. - There was mild mitral valvular regurgitation. - The left atrium was mildly dilated. - The estimated peak pulmonary artery systolic pressure was mildly increased.  ECG:  09/05/2013 SVT,  likely atrial fibrillation with rapid ventricular response; LAFB and RBBB are old Vent. rate 151 BPM PR interval * ms QRS duration 148 ms QT/QTc 356/564 ms P-R-T axes * -76  85  Radiology:  09/03/2013, portable pelvis x-ray IMPRESSION: 1. Subtle findings raising the possibility of proximal femoral metaphyseal fracture in the vicinity of the cerclage wire. Correlate with findings at surgery. CT might be utilized for confirmation, if clinically warranted.  Two-view chest x-ray 08/30/2013 No acute disease  ASSESSMENT AND PLAN:   The patient was seen today by Dr. Shirlee Latch, the patient evaluated and the data reviewed.  Principal Problem:   S/P right TH revision -  Active Problems:   Atrial flutter with rapid ventricular response - the exact time of onset is unclear, however his vital signs this morning showed a heart rate approximately 90. This is consistent with his heart rate since admission. Therefore, we feel sure the flutter started sometime today. We can use diltiazem IV for rate control and consider cardioversion in a.m. if he can be fully anticoagulated and he does not convert spontaneously.    SignedTheodore Demark, PA-C 09/05/2013 5:00 PM Beeper [phone]  Co-Sign MD  Patient seen with PA, agree with the above note.  1. Atrial flutter: Patient is in atrial flutter (perhaps atypical flutter as flutter waves are not classic) with HR 150.  He denies chest pain or dyspnea.  He does not feel palpitations.  BP is preserved.  He has a history of atrial flutter s/p ablation years ago.  He clearly went into flutter this afternoon.   - Stop labetalol po and start diltiazem gtt with 10 mg IV bolus.  - Will give magnesium sulfate dose.  - Increase Xarelto to 20 mg daily if ok with surgery.  - If HR is hard to control, he could be cardioverted tomorrow morning as he just went into atrial flutter this afternoon. Will keep NPO.  - Will do echo.  2. CAD: History of PCI distantly by Dr. Eldridge Dace.  No chest pain currently.   Marca Ancona 09/05/2013

## 2013-09-05 NOTE — Progress Notes (Signed)
Matt babish PA called with orders to transfer pt to telemetry....[employer]

## 2013-09-05 NOTE — Progress Notes (Signed)
Called to give report. Have titrated drip x 2 with no decrease in HR. Will call back.

## 2013-09-05 NOTE — Progress Notes (Signed)
   Subjective: 2 Days Post-Op Procedure(s) (LRB): RIGHT TOTAL HIP REVISION (Right)   Patient reports pain as severe, waves of intense pain. Otherwise no events throughout the night.  [name] lacks tolerance of pain and has been on high dose pain meds for some time now. This will make this a challenge in post-operative period, evident again today.   Objective:   VITALS:   Filed Vitals:   09/05/13 0529  BP: 136/58  Pulse: 93  Temp: 98.7 F (37.1 C)  Resp: 16    Neurovascular intact Dorsiflexion/Plantar flexion intact Incision: dressing C/D/I No cellulitis present Compartment soft  LABS  Recent Labs  09/04/13 0440 09/05/13 0420  HGB 9.9* 8.9*  HCT 28.4* 26.0*  WBC 7.7 8.5  PLT 172 150     Recent Labs  09/04/13 0440 09/05/13 0420  NA 134* 136  K 4.0 3.5  BUN 12 13  CREATININE 0.78 0.80  GLUCOSE 190* 158*     Assessment/Plan: 2 Days Post-Op Procedure(s) (LRB): RIGHT TOTAL HIP REVISION (Right) Up with therapy,  as tolerable today Discharge to SNF eventually, when ready.  Wife is not going to be able to safely manage him at home. Due to condition and lack of tolerance to initial post-operative period we will maintain foley to better monitor output.  Expected ABLA  Treated with iron and will observe  Overweight (BMI 25-29.9) Estimated body mass index is 25.95 kg/(m^2) as calculated from the following:   Height as of this encounter: 5\' 11"  (1.803 m).   Weight as of this encounter: 84.369 kg (186 lb). Patient also counseled that weight may inhibit the healing process Patient counseled that losing weight will help with future health issues        Thomas [name]. Thomas [name]   PAC  09/05/2013, 9:23 AM

## 2013-09-05 NOTE — Progress Notes (Addendum)
Called ortho re: full anticoag, no problem.  Pharmacy said OK to give an extra 10 mg of Xarelto tonight, start 20 mg daily in am.  Called anesthesia about possible DCCV in am.   Plan: NPO after midnight - ordered Permit: ordered If DCCV needed in am, call [phone] and speak to anesthesiologist on call to arrange, should not be a problem.

## 2013-09-05 NOTE — Progress Notes (Signed)
ICU unable to take patient at this time.  Cardizem drip started per 4th floor guidelines.  Patient stable at shift change, still asymptomatic, and report has been given to night RN, Crystal.

## 2013-09-05 NOTE — Progress Notes (Signed)
OT Cancellation Note  Patient Details Name: Thomas [name] MRN: [id number] DOB: November 01, 1940   Cancelled Treatment:    Reason Eval/Treat Not Completed: Other (comment). The plan for pt is SNF per MD and PT recommendations, will defer OT eval to that facility. Acute OT will sign off.  Evette Georges [phone] 09/05/2013, 7:11 AM

## 2013-09-05 NOTE — Progress Notes (Addendum)
Patient received transfer from 6E.  Patient VSS, asymptomatic, resting in bed with wife at bedside.  Agree with previous RN's assessment of patient.  Cardiology paged to notify them patient has arrived to telemetry unit.  Patient HR 160s sustained, supraventricular rhythm.  No orders at this time.  Await call back from cardiology.  Will continue to monitor patient closely.

## 2013-09-05 NOTE — Progress Notes (Signed)
Report given to icu. Charge nurse and RN shannon to take pt down to icu.

## 2013-09-05 NOTE — Progress Notes (Signed)
Physical Therapy Treatment Patient Details Name: Thomas [name] MRN: [id number] DOB: 09/19/1941 Today's Date: 09/05/2013 Time: 1324-4010 PT Time Calculation (min): 23 min  PT Assessment / Plan / Recommendation  History of Present Illness right posterior hip revision   PT Comments   Pt continues ltd by c/o pain and difficulty focusing on task.  Follow Up Recommendations  SNF     Does the patient have the potential to tolerate intense rehabilitation     Barriers to Discharge        Equipment Recommendations  None recommended by PT    Recommendations for Other Services    Frequency 7X/week   Progress towards PT Goals Progress towards PT goals: Progressing toward goals  Plan Current plan remains appropriate    Precautions / Restrictions Precautions Precautions: Posterior Hip Precaution Comments: handout posted for THP;  pt unable to recall THP Required Braces or Orthoses: Knee Immobilizer - Right Restrictions Weight Bearing Restrictions: Yes RLE Weight Bearing: Partial weight bearing RLE Partial Weight Bearing Percentage or Pounds: 50%   Pertinent Vitals/Pain Pt unable to rate pain beyond "hurts like hell"    Mobility  Bed Mobility Bed Mobility: Sit to Supine Sit to Supine: 1: +2 Total assist;HOB flat Sit to Supine: Patient Percentage: 40% Details for Bed Mobility Assistance: multi-modal cues for THP Transfers Transfers: Sit to Stand;Stand to Sit Sit to Stand: From chair/3-in-1;1: +2 Total assist Sit to Stand: Patient Percentage: 40% Stand to Sit: 1: +2 Total assist;To bed;To chair/3-in-1;With armrests;With upper extremity assist Stand to Sit: Patient Percentage: 40% Details for Transfer Assistance: multi-modal cues for hand placement and THP as well as to control descent and ensure overall safety Ambulation/Gait Ambulation/Gait Assistance: 1: +2 Total assist Ambulation/Gait: Patient Percentage: 50% Ambulation Distance (Feet): 5 Feet (2' and 3') Assistive device:  Rolling walker Ambulation/Gait Assistance Details: cues for posture, sequence, position from RW and increased UE WB Gait Pattern: Step-to pattern;Antalgic;Trunk flexed;Narrow base of support Gait velocity: slow General Gait Details: pt extremely unsafe, moderately impulsive,  with increased UE fatigue    Exercises     PT Diagnosis:    PT Problem List:   PT Treatment Interventions:     PT Goals (current goals can now be found in the care plan section) Acute Rehab PT Goals PT Goal Formulation: With patient Time For Goal Achievement: 09/11/13 Potential to Achieve Goals: Good  Visit Information  Last PT Received On: 09/05/13 Assistance Needed: +2 History of Present Illness: right posterior hip revision    Subjective Data  Subjective: I'm getting a headache, I need to sit down, I'm going to throw up.     Cognition  Cognition Arousal/Alertness: Awake/alert Behavior During Therapy: Restless Overall Cognitive Status: Impaired/Different from baseline Area of Impairment: Orientation;Following commands;Safety/judgement;Awareness Orientation Level: Disoriented to;Time;Situation Memory: Decreased recall of precautions Following Commands: Follows one step commands inconsistently Safety/Judgement: Decreased awareness of safety;Decreased awareness of deficits General Comments: pt continues to have difficulty focusing on task, following basic commands    Balance     End of Session PT - End of Session Equipment Utilized During Treatment: Gait belt;Right knee immobilizer Activity Tolerance: Patient limited by fatigue;Patient limited by pain Patient left: in bed;with call bell/phone within reach;with family/visitor present Nurse Communication: Mobility status   GP     Thomas [name] 09/05/2013, 12:43 PM

## 2013-09-06 DIAGNOSIS — I517 Cardiomegaly: Secondary | ICD-10-CM

## 2013-09-06 LAB — GLUCOSE, CAPILLARY
Glucose-Capillary: 147 mg/dL — ABNORMAL HIGH (ref 70–99)
Glucose-Capillary: 233 mg/dL — ABNORMAL HIGH (ref 70–99)
Glucose-Capillary: 176 mg/dL — ABNORMAL HIGH (ref 70–99)
Glucose-Capillary: 136 mg/dL — ABNORMAL HIGH (ref 70–99)

## 2013-09-06 LAB — BODY FLUID CULTURE: Culture: NO GROWTH

## 2013-09-06 MED ORDER — DILTIAZEM HCL ER COATED BEADS 180 MG PO CP24
180.0000 mg | ORAL_CAPSULE | Freq: Every day | ORAL | Status: DC
  Administered 2013-09-06 – 2013-09-07 (×2): 180 mg via ORAL
  Filled 2013-09-06 (×2): qty 1

## 2013-09-06 MED ORDER — FERROUS SULFATE 325 (65 FE) MG PO TABS: 325.0000 mg | ORAL_TABLET | Freq: Three times a day (TID) | ORAL | Status: DC

## 2013-09-06 MED ORDER — POLYETHYLENE GLYCOL 3350 17 G PO PACK: 17.0000 g | PACK | Freq: Two times a day (BID) | ORAL | Status: DC

## 2013-09-06 MED ORDER — DSS 100 MG PO CAPS: 100.0000 mg | ORAL_CAPSULE | Freq: Two times a day (BID) | ORAL | Status: DC

## 2013-09-06 NOTE — Progress Notes (Signed)
Patient ID: Thomas [name], male   DOB: [birth year], 72 y.o.   MRN: [id number] Subjective: 3 Days Post-Op Procedure(s) (LRB): RIGHT TOTAL HIP REVISION (Right)    Patient reports pain as moderate worse with activity.  His pain coping mechanisms are not similar to most others.  Events pertaining to his heart are reviewed Apparently waiting for possible cardioversion today   Objective:   VITALS:   Filed Vitals:   09/06/13 0530  BP: 120/53  Pulse: 137  Temp:   Resp:     Neurovascular intact Incision: dressing C/D/I  D/C'd immobilizer  LABS  Recent Labs  09/04/13 0440 09/05/13 0420  HGB 9.9* 8.9*  HCT 28.4* 26.0*  WBC 7.7 8.5  PLT 172 150     Recent Labs  09/04/13 0440 09/05/13 0420  NA 134* 136  K 4.0 3.5  BUN 12 13  CREATININE 0.78 0.80  GLUCOSE 190* 158*    No results found for this basename: LABPT, INR,  in the last 72 hours   Assessment/Plan: 3 Days Post-Op Procedure(s) (LRB): RIGHT TOTAL HIP REVISION (Right)   Advance diet Up with therapy Discharge to SNF when medically stable and per cardiac clearance issues  Reviewed his pain perception and how it will affect his recovery - this has been discussed and reviewed at length  Appreciate cardiology input and management  If stabilized I will plan on him going to SNF tomorrow If cardiac medications added and/or needed we wil add to discharge meds - thanks

## 2013-09-06 NOTE — Discharge Summary (Signed)
Physician Discharge Summary  Patient ID: Thomas [name] MRN: [id number] DOB/AGE: 1940-12-16 72 y.o.  Admit date: 09/03/2013 Discharge date:     09/07/2013  Procedures:  Procedure(s) (LRB): RIGHT TOTAL HIP REVISION (Right)  Attending Physician:  Dr. 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 Diabetes mellitus   . CAD (coronary artery disease)   . Hypertension   . Claudication in peripheral vascular disease 07/08/2011    Left external iliac occlusion with collaterals; mild-moderate right external iliac disease; moderate calcified left popliteal disease  . Carotid artery disease 2012    < 50% stenosis bilaterally  . Hyperlipidemia   . AAA (abdominal aortic aneurysm)   . Chronic kidney disease     hx kidney stones  . Arthritis   . 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 male, has a history of [name] and functional disability in the right hip due to failed total hip arthroplasty and patient has failed non-surgical conservative treatments for greater than 12 weeks to include NSAID's and/or analgesics, supervised PT with diminished ADL's post treatment, use of assistive devices and activity modification. The indications for the revision total hip arthroplasty are bearing surface wear leading to symptomatic synovitis and hip instability. Onset of symptoms was abrupt starting 5 years ago with gradually worsening course since that time. Prior procedures on the right hip include arthroplasty per Dr. Eulah [name] on 2009. Patient currently rates [name] in the right hip at 8 out of 10 with activity. There is night [name], worsening of [name] with activity and weight bearing, trendelenberg gait, [name] that interfers with activities of  daily living, [name] with passive range of motion and joint swelling. Patient has evidence of fluid collection surrounding the total hip arthroplasty by imaging studies. This condition presents safety issues increasing the risk of falls. Discussion was had regarding possibly doing an osteotomy to remove the prosthesis and changing the position of the acetabular component. There is no current active infection with normal sed rate and CRP labs. Risks, benefits and expectations were discussed with the patient. Risks including but not limited to the risk of anesthesia, blood clots, nerve damage, blood vessel damage, failure of the prosthesis, infection and up to and including death. Patient understand the risks, benefits and expectations and wishes to proceed with surgery.   PCP: Thomas Canterbury, MD   Discharged Condition: good  Hospital Course:  Patient underwent the above stated procedure on 09/03/2013. Patient tolerated the procedure well and brought to the recovery room in good condition and subsequently to the floor.  POD #1 BP: 156/72 ; Pulse: 96 ; Temp: 99.4 F (37.4 C) ; Resp: 18 Patient reports [name] as moderate. Thomas [name] and has been on high dose [name] meds for some time now. This will make this a challenge in post-operative period. Due to anxiety associated with his [name] will be using ativan to calm him down, I worry about hi underlying coping mechanism. Neurovascular intact, dorsiflexion/plantar flexion intact, incision: dressing C/D/I, no cellulitis present and compartment soft.   LABS  Basename    HGB  9.9  HCT  28.4   POD #2  BP: 136/58 ; Pulse: 93 ; Temp: 98.7 F (37.1 C) ;  Resp: 16  Patient reports [name] as severe, waves of intense [name]. Otherwise no events throughout the night. Thomas [name] lacks tolerance of [name] and has been on high dose [name] meds for some time now. This will make this a challenge in post-operative period, evident again today.  Neurovascular  intact, dorsiflexion/plantar flexion intact, incision: dressing C/D/I, no cellulitis present and compartment soft.   LABS  Basename    HGB  8.9  HCT  26.0   POD #3  BP: 120/53 ;  Pulse: 137  Patient reports [name] as moderate worse with activity. His [name] coping mechanisms are not similar to most others. Events pertaining to his heart are reviewed.  Apparently waiting for possible cardioversion today. Neurovascular intact, dorsiflexion/plantar flexion intact, incision: dressing C/D/I, no cellulitis present and compartment soft.  LABS   No new labs  POD #4  Stable.  Converted to NSR and switched to oral diltiazem.  Wound dry.  Slow progress with therapy.   Discharge Exam: General appearance: alert, cooperative and no distress Extremities: Homans sign is negative, no sign of DVT, no edema, redness or tenderness in the calves or thighs and no ulcers, gangrene or trophic changes  Disposition: SNF with follow up in 2 weeks   Follow-up Information   Follow up with Thomas Pal, MD. Schedule an appointment as soon as possible for a visit in 2 weeks.   Specialty:  Orthopedic Surgery   Contact information:   [address] [place] [address] [phone]       Discharge Orders   Future Appointments Provider Department Dept Phone   09/21/2013 12:40 PM Thomas Oyster, MD [facility] [phone]   11/30/2013 11:15 AM Thomas Rank, MD [facility] [phone]   Future Orders Complete By Expires   Call MD / Call 911  As directed    Comments:     If you experience chest [name] or shortness of breath, CALL 911 and be transported to the hospital emergency room.  If you develope a fever above 101 F, pus (white drainage) or increased drainage or redness at the wound, or calf [name], call your surgeon's office.   Change dressing  As directed    Comments:     Maintain surgical dressing for 10-14 days, then replace with  4x4 guaze and tape. Keep the area dry and clean.   Constipation Prevention  As directed    Comments:     Drink plenty of fluids.  Prune juice may be helpful.  You may use a stool softener, such as Colace (over the counter) 100 mg twice a day.  Use MiraLax (over the counter) for constipation as needed.   Diet - low sodium heart healthy  As directed    Discharge instructions  As directed    Comments:     Maintain surgical dressing for 10-14 days, then replace with gauze and tape. Keep the area dry and clean until follow up. Follow up in 2 weeks at [facility]. Call with any questions or concerns.   Driving restrictions  As directed    Comments:     No driving for 4 weeks   Partial weight bearing  As directed    Questions:     % Body Weight:  50   Laterality:  right   Extremity:  Lower   TED hose  As directed    Comments:     Use stockings (TED hose) for 2 weeks on both leg(s).  You may remove them at night for sleeping.         Medication List    STOP taking these medications       oxycodone 5 MG capsule  Commonly known as:  OXY-IR  Replaced by:  Oxycodone HCl 10 MG Tabs     VOLTAREN 1 % Gel  Generic drug:  diclofenac sodium      TAKE these medications       APIDRA SOLOSTAR 100 UNIT/ML Sopn  Generic drug:  Insulin Glulisine  Inject 15 Units as directed every morning.     aspirin EC 81 MG tablet  Take 81 mg by mouth daily.     diltiazem 180 MG 24 hr capsule  Commonly known as:  CARDIZEM CD  Take 1 capsule (180 mg total) by mouth daily.     DSS 100 MG Caps  Take 100 mg by mouth 2 (two) times daily.     fenofibrate micronized 134 MG capsule  Commonly known as:  LOFIBRA  Take 134 mg by mouth daily before breakfast.     ferrous sulfate 325 (65 FE) MG tablet  Take 1 tablet (325 mg total) by mouth 3 (three) times daily after meals.     fish oil-omega-3 fatty acids 1000 MG capsule  Take 1 g by mouth daily.     gabapentin 300 MG capsule  Commonly known  as:  NEURONTIN  Take 300 mg by mouth 3 (three) times daily. 1 am, 1 lunch, 1 bedtime     glipiZIDE 10 MG tablet  Commonly known as:  GLUCOTROL  Take 10 mg by mouth daily.     HUMALOG KWIKPEN 100 UNIT/ML Sopn  Generic drug:  insulin lispro  Inject 30 Units as directed every morning.     insulin glargine 100 UNIT/ML injection  Commonly known as:  LANTUS  Inject 60 Units into the skin every morning.     isosorbide mononitrate 30 MG 24 hr tablet  Commonly known as:  IMDUR  Take 30 mg by mouth every morning.     lisinopril 40 MG tablet  Commonly known as:  PRINIVIL,ZESTRIL  Take 40 mg by mouth every morning.     metFORMIN 500 MG tablet  Commonly known as:  GLUCOPHAGE  Take 1,000 mg by mouth daily.     morphine 30 MG 12 hr tablet  Commonly known as:  MS CONTIN  Take 60 mg by mouth every 12 (twelve) hours.     nitroGLYCERIN 0.4 MG SL tablet  Commonly known as:  NITROSTAT  Place 0.4 mg under the tongue every 5 (five) minutes as needed.     Oxycodone HCl 10 MG Tabs  Take 1-2 tablets (10-20 mg total) by mouth every 4 (four) hours.     polyethylene glycol packet  Commonly known as:  MIRALAX / GLYCOLAX  Take 17 g by mouth 2 (two) times daily.     rivaroxaban 10 MG Tabs tablet  Commonly known as:  XARELTO  Take 1 tablet (10 mg total) by mouth daily.     rosuvastatin 10 MG tablet  Commonly known as:  CRESTOR  Take 10 mg by mouth every morning.     saccharomyces boulardii 250 MG capsule  Commonly known as:  FLORASTOR  Take 250 mg by mouth daily.     zolpidem 10 MG tablet  Commonly known as:  AMBIEN  Take 10 mg by mouth at bedtime as needed for sleep.          Signed:  Durene Romans, MD

## 2013-09-06 NOTE — Progress Notes (Signed)
    Subjective:  No chest pain or palpitations.  Objective:  Vital Signs in the last 24 hours: Temp:  [98 F (36.7 C)-101 F (38.3 C)] 98.6 F (37 C) (11/27 0800) Pulse Rate:  [61-159] 138 (11/27 0800) Resp:  [14-28] 26 (11/27 0800) BP: (100-139)/(39-80) 125/67 mmHg (11/27 0800) SpO2:  [96 %-100 %] 96 % (11/27 0800) Weight:  [186 lb (84.369 kg)] 186 lb (84.369 kg) (11/26 1500)  Intake/Output from previous day: 11/26 0701 - 11/27 0700 In: 375 [P.O.:240; I.V.:135] Out: 350 [Urine:350]  Physical Exam: Pt is alert and oriented, NAD HEENT: normal Neck: JVP - normal Lungs: CTA bilaterally CV: RRR without murmur or gallop Abd: soft, NT Skin: warm/dry no rash   Lab Results:  Recent Labs  09/04/13 0440 09/05/13 0420  WBC 7.7 8.5  HGB 9.9* 8.9*  PLT 172 150    Recent Labs  09/04/13 0440 09/05/13 0420  NA 134* 136  K 4.0 3.5  CL 100 100  CO2 24 25  GLUCOSE 190* 158*  BUN 12 13  CREATININE 0.78 0.80   No results found for this basename: TROPONINI, CK, MB,  in the last 72 hours  Cardiac Studies: 2D Echo pending  Tele: Atrial fib converted to sinus this am 0830  Assessment/Plan:  1. Atrial flutter with RVR - converted to sinus rhythm this am spontaneously on diltiazem drip. Anticoagulated with Xarelto. Will transition from IV to PO diltiazem today.   2. Stable without angina on isosorbide.  Plan: if remains in sinus will be ok from CV standpoint for transfer to rehab tomorrow. thx   Tonny Bollman, M.D. 09/06/2013, 10:12 AM

## 2013-09-06 NOTE — Progress Notes (Signed)
CARE MANAGEMENT NOTE 09/06/2013  Patient:  TROI, FLORENDO   Account Number:  [id number]  Date Initiated:  09/05/2013  Documentation initiated by:  Colleen Can  Subjective/Objective Assessment:   dx failed rt total hip arthroplasty; Revision rt total hip arthroplasty     Action/Plan:   Plans are for SNF rehab   Anticipated DC Date:  09/09/2013   Anticipated DC Plan:  SKILLED NURSING FACILITY  In-house referral  Clinical Social Worker      DC Planning Services  CM consult      Choice offered to / List presented to:             Status of service:  Completed, signed off Medicare Important Message given?   (If response is "NO", the following Medicare IM given date fields will be blank) Date Medicare IM given:   Date Additional Medicare IM given:    Discharge Disposition:    Per UR Regulation:    If discussed at Long Length of Stay Meetings, dates discussed:    Comments:  [id number]/Belinda Schlichting Lorrin Mais: Case management [phone] Chart reviewed and updated.  Next chart review due on [id number].  patient transferred to icu due to a.fib with rvr and iv cardizem drip 09/06/13 Needs for discharge at time of review:  none

## 2013-09-06 NOTE — Progress Notes (Signed)
Echocardiogram 2D Echocardiogram has been performed.  Thomas [name] 09/06/2013, 9:45 AM

## 2013-09-06 NOTE — Progress Notes (Signed)
On call cardiologist, Dr. Arelia Sneddon called and notified of pt having HR sustaining between 130s-140s in a junctional rhythm. Pt currently is resting in the bed with no complaints. Metoprolol was given at 0255 with no changes.Pt currently has Cardizem gtt @ 15. No further orders given at this time.

## 2013-09-07 LAB — GLUCOSE, CAPILLARY: Glucose-Capillary: 126 mg/dL — ABNORMAL HIGH (ref 70–99)

## 2013-09-07 MED ORDER — RIVAROXABAN 10 MG PO TABS: 10.0000 mg | ORAL_TABLET | ORAL | Status: DC

## 2013-09-07 MED ORDER — OXYCODONE HCL 10 MG PO TABS: 10.0000 mg | ORAL_TABLET | ORAL | Status: DC

## 2013-09-07 MED ORDER — DILTIAZEM HCL ER COATED BEADS 180 MG PO CP24: 180.0000 mg | ORAL_CAPSULE | Freq: Every day | ORAL | Status: DC

## 2013-09-07 NOTE — Progress Notes (Signed)
Patient is set to discharge to [facility] SNF today. Patient & wife at bedside aware. Discharge packet with shadow chart, RN - Synetta Fail aware. [facility] called for transport pickup.   Clinical Social Work Department CLINICAL SOCIAL WORK PLACEMENT NOTE 09/07/2013  Patient:  Thomas [name], Thomas [name]  Account Number:  [id number] Admit date:  09/03/2013  Clinical Social Worker:  Orpah Greek  Date/time:  09/05/2013 02:55 PM  Clinical Social Work is seeking post-discharge placement for this patient at the following level of care:   SKILLED NURSING   (*CSW will update this form in Epic as items are completed)   09/05/2013  Patient/family provided with [facility] Department of Clinical Social Work's list of facilities offering this level of care within the geographic area requested by the patient (or if unable, by the patient's family).  09/05/2013  Patient/family informed of their freedom to choose among providers that offer the needed level of care, that participate in Medicare, Medicaid or managed care program needed by the patient, have an available bed and are willing to accept the patient.  09/05/2013  Patient/family informed of [facility]' ownership interest in [facility], as well as of the fact that they are under no obligation to receive care at this facility.  PASARR submitted to EDS on 09/05/2013 PASARR number received from EDS on 09/05/2013  FL2 transmitted to all facilities in geographic area requested by pt/family on  09/05/2013 FL2 transmitted to all facilities within larger geographic area on   Patient informed that his/her managed care company has contracts with or will negotiate with  certain facilities, including the following:     Patient/family informed of bed offers received:  09/05/2013 Patient chooses bed at [facility] Physician recommends and patient chooses bed at    Patient to be transferred to [facility] on   09/07/2013 Patient to be transferred to facility by [facility]  The following physician request were entered in Epic:   Additional Comments:   Unice Bailey, LCSW [facility] Clinical Social Worker cell #: [phone]

## 2013-09-07 NOTE — Progress Notes (Signed)
    Subjective:  No chest pain or palpitations.  Objective:  Vital Signs in the last 24 hours: Temp:  [98.4 F (36.9 C)-98.6 F (37 C)] 98.6 F (37 C) (11/28 0400) Pulse Rate:  [74-138] 85 (11/28 0654) Resp:  [12-26] 19 (11/28 0502) BP: (95-159)/(45-67) 136/54 mmHg (11/28 0502) SpO2:  [94 %-100 %] 96 % (11/28 0654)  Intake/Output from previous day: 11/27 0701 - 11/28 0700 In: 1045 [P.O.:990; I.V.:55] Out: 925 [Urine:925]  Physical Exam: Pt is alert and oriented, NAD HEENT: normal Neck: JVP - normal Lungs: CTA bilaterally CV: RRR without murmur or gallop Abd: soft, NT, Positive BS, no hepatomegaly Ext: no C/C/E, distal pulses intact and equal Skin: warm/dry no rash   Lab Results:  Recent Labs  09/05/13 0420  WBC 8.5  HGB 8.9*  PLT 150    Recent Labs  09/05/13 0420  NA 136  K 3.5  CL 100  CO2 25  GLUCOSE 158*  BUN 13  CREATININE 0.80   No results found for this basename: TROPONINI, CK, MB,  in the last 72 hours  Tele: Sinus rhythm  Echo: Study Conclusions  - Left ventricle: The cavity size was normal. There was mild concentric hypertrophy. Systolic function was normal. The estimated ejection fraction was in the range of 55% to 60%. Wall motion was normal; there were no regional wall motion abnormalities. There was a reduced contribution of atrial contraction to ventricular filling, due to increased ventricular diastolic pressure or atrial contractile dysfunction. Doppler parameters are consistent with a reversible restrictive pattern, indicative of decreased left ventricular diastolic compliance and/or increased left atrial pressure (grade 3 diastolic dysfunction). - Pulmonary arteries: PA peak pressure: 37mm Hg (S). Impressions:  - The right ventricular systolic pressure was increased consistent with mild pulmonary hypertension.   Assessment/Plan:  1. Atrial flutter with RVR. Pt anticoagulated with Xarelto. Tele reviewed and no further  atrial flutter. Would d/c on diltiazem CD 180 mg daily. Will arrange follow-up with Dr Eldridge Dace in 2 weeks.   2. CAD - stable without anginal symptoms on current medical program  Dispo: from cardiac perspective he is stable for discharge to rehab.   Tonny Bollman, M.D. 09/07/2013, 7:16 AM

## 2013-09-07 NOTE — Progress Notes (Signed)
Report called to [place], Charity fundraiser at [facility] for transfer to [facility]

## 2013-09-07 NOTE — Progress Notes (Signed)
Patient ID: Thomas [name], male   DOB: Aug 04, 1941, 72 y.o.   MRN: [id number] Subjective: 4 Days Post-Op Procedure(s) (LRB): RIGHT TOTAL HIP REVISION (Right)    Patient reports pain as moderate.  But sitting upright in bed eating a hearty breakfast. Reports that he feels he is a dazed state - which I took the opportunity to review was related to the amount of pain medications he has allowed himself to get on  Objective:   VITALS:   Filed Vitals:   09/07/13 0800  BP:   Pulse:   Temp: 98.6 F (37 C)  Resp:     Neurovascular intact Incision: dressing C/D/I  LABS  Recent Labs  09/05/13 0420  HGB 8.9*  HCT 26.0*  WBC 8.5  PLT 150     Recent Labs  09/05/13 0420  NA 136  K 3.5  BUN 13  CREATININE 0.80  GLUCOSE 158*    No results found for this basename: LABPT, INR,  in the last 72 hours   Assessment/Plan: 4 Days Post-Op Procedure(s) (LRB): RIGHT TOTAL HIP REVISION (Right)   Discharge to SNF today  Medically (cardiac wise) stable Orthopaedically stable and ready for discharge to Snf to progress with therapy

## 2013-09-08 LAB — ANAEROBIC CULTURE

## 2013-09-16 ENCOUNTER — Emergency Department (HOSPITAL_COMMUNITY): Payer: Medicare Other

## 2013-09-16 ENCOUNTER — Inpatient Hospital Stay (HOSPITAL_COMMUNITY)
Admission: EM | Admit: 2013-09-16 | Discharge: 2013-09-21 | DRG: 308 | Disposition: A | Discharge status: Home Health | Payer: Medicare Other | Attending: Internal Medicine | Admitting: Internal Medicine

## 2013-09-16 ENCOUNTER — Inpatient Hospital Stay (HOSPITAL_COMMUNITY): Payer: Medicare Other

## 2013-09-16 ENCOUNTER — Encounter (HOSPITAL_COMMUNITY): Payer: Self-pay | Admitting: Emergency Medicine

## 2013-09-16 DIAGNOSIS — M169 Osteoarthritis of hip, unspecified: Secondary | ICD-10-CM | POA: Diagnosis present

## 2013-09-16 DIAGNOSIS — I129 Hypertensive chronic kidney disease with stage 1 through stage 4 chronic kidney disease, or unspecified chronic kidney disease: Secondary | ICD-10-CM | POA: Diagnosis present

## 2013-09-16 DIAGNOSIS — M76899 Other specified enthesopathies of unspecified lower limb, excluding foot: Secondary | ICD-10-CM

## 2013-09-16 DIAGNOSIS — I10 Essential (primary) hypertension: Secondary | ICD-10-CM | POA: Diagnosis present

## 2013-09-16 DIAGNOSIS — N189 Chronic kidney disease, unspecified: Secondary | ICD-10-CM | POA: Diagnosis present

## 2013-09-16 DIAGNOSIS — G5790 Unspecified mononeuropathy of unspecified lower limb: Secondary | ICD-10-CM | POA: Diagnosis present

## 2013-09-16 DIAGNOSIS — R0602 Shortness of breath: Secondary | ICD-10-CM

## 2013-09-16 DIAGNOSIS — E119 Type 2 diabetes mellitus without complications: Secondary | ICD-10-CM | POA: Diagnosis present

## 2013-09-16 DIAGNOSIS — M1610 Unilateral primary osteoarthritis, unspecified hip: Secondary | ICD-10-CM | POA: Diagnosis present

## 2013-09-16 DIAGNOSIS — M25551 Pain in right hip: Secondary | ICD-10-CM

## 2013-09-16 DIAGNOSIS — Z9861 Coronary angioplasty status: Secondary | ICD-10-CM

## 2013-09-16 DIAGNOSIS — Z808 Family history of malignant neoplasm of other organs or systems: Secondary | ICD-10-CM

## 2013-09-16 DIAGNOSIS — D649 Anemia, unspecified: Secondary | ICD-10-CM | POA: Diagnosis present

## 2013-09-16 DIAGNOSIS — I4891 Unspecified atrial fibrillation: Principal | ICD-10-CM | POA: Diagnosis present

## 2013-09-16 DIAGNOSIS — M2170 Unequal limb length (acquired), unspecified site: Secondary | ICD-10-CM

## 2013-09-16 DIAGNOSIS — I714 Abdominal aortic aneurysm, without rupture, unspecified: Secondary | ICD-10-CM | POA: Diagnosis present

## 2013-09-16 DIAGNOSIS — Z794 Long term (current) use of insulin: Secondary | ICD-10-CM

## 2013-09-16 DIAGNOSIS — I509 Heart failure, unspecified: Secondary | ICD-10-CM | POA: Diagnosis present

## 2013-09-16 DIAGNOSIS — Z7982 Long term (current) use of aspirin: Secondary | ICD-10-CM

## 2013-09-16 DIAGNOSIS — I5031 Acute diastolic (congestive) heart failure: Secondary | ICD-10-CM

## 2013-09-16 DIAGNOSIS — M5116 Intervertebral disc disorders with radiculopathy, lumbar region: Secondary | ICD-10-CM

## 2013-09-16 DIAGNOSIS — Z79899 Other long term (current) drug therapy: Secondary | ICD-10-CM

## 2013-09-16 DIAGNOSIS — Z889 Allergy status to unspecified drugs, medicaments and biological substances status: Secondary | ICD-10-CM

## 2013-09-16 DIAGNOSIS — I4892 Unspecified atrial flutter: Secondary | ICD-10-CM

## 2013-09-16 DIAGNOSIS — I70219 Atherosclerosis of native arteries of extremities with intermittent claudication, unspecified extremity: Secondary | ICD-10-CM

## 2013-09-16 DIAGNOSIS — I5033 Acute on chronic diastolic (congestive) heart failure: Secondary | ICD-10-CM | POA: Diagnosis present

## 2013-09-16 DIAGNOSIS — E785 Hyperlipidemia, unspecified: Secondary | ICD-10-CM | POA: Diagnosis present

## 2013-09-16 DIAGNOSIS — Z79891 Long term (current) use of opiate analgesic: Secondary | ICD-10-CM

## 2013-09-16 DIAGNOSIS — Z96649 Presence of unspecified artificial hip joint: Secondary | ICD-10-CM

## 2013-09-16 DIAGNOSIS — I739 Peripheral vascular disease, unspecified: Secondary | ICD-10-CM | POA: Diagnosis present

## 2013-09-16 DIAGNOSIS — R079 Chest pain, unspecified: Secondary | ICD-10-CM

## 2013-09-16 DIAGNOSIS — Z87891 Personal history of nicotine dependence: Secondary | ICD-10-CM

## 2013-09-16 DIAGNOSIS — Z836 Family history of other diseases of the respiratory system: Secondary | ICD-10-CM

## 2013-09-16 DIAGNOSIS — E876 Hypokalemia: Secondary | ICD-10-CM | POA: Diagnosis not present

## 2013-09-16 DIAGNOSIS — Z833 Family history of diabetes mellitus: Secondary | ICD-10-CM

## 2013-09-16 DIAGNOSIS — I2510 Atherosclerotic heart disease of native coronary artery without angina pectoris: Secondary | ICD-10-CM | POA: Diagnosis present

## 2013-09-16 LAB — CBC
HCT: 30.1 % — ABNORMAL LOW (ref 39.0–52.0)
Hemoglobin: 9.7 g/dL — ABNORMAL LOW (ref 13.0–17.0)
MCH: 29.7 pg (ref 26.0–34.0)
MCHC: 32.2 g/dL (ref 30.0–36.0)
MCV: 92 fL (ref 78.0–100.0)
Platelets: 339 10*3/uL (ref 150–400)
RBC: 3.27 MIL/uL — ABNORMAL LOW (ref 4.22–5.81)
RDW: 14 % (ref 11.5–15.5)
WBC: 6.8 10*3/uL (ref 4.0–10.5)

## 2013-09-16 LAB — COMPREHENSIVE METABOLIC PANEL WITH GFR
ALT: 14 U/L (ref 0–53)
AST: 22 U/L (ref 0–37)
Albumin: 3.1 g/dL — ABNORMAL LOW (ref 3.5–5.2)
Alkaline Phosphatase: 112 U/L (ref 39–117)
BUN: 14 mg/dL (ref 6–23)
CO2: 27 meq/L (ref 19–32)
Calcium: 8.8 mg/dL (ref 8.4–10.5)
Chloride: 97 meq/L (ref 96–112)
Creatinine, Ser: 0.84 mg/dL (ref 0.50–1.35)
GFR calc Af Amer: >90 mL/min
GFR calc non Af Amer: 87 mL/min — ABNORMAL LOW
Glucose, Bld: 140 mg/dL — ABNORMAL HIGH (ref 70–99)
Potassium: 3.6 meq/L (ref 3.5–5.1)
Sodium: 134 meq/L — ABNORMAL LOW (ref 135–145)
Total Bilirubin: 0.4 mg/dL (ref 0.3–1.2)
Total Protein: 6.8 g/dL (ref 6.0–8.3)

## 2013-09-16 LAB — GLUCOSE, CAPILLARY

## 2013-09-16 LAB — URINALYSIS, ROUTINE W REFLEX MICROSCOPIC
Glucose, UA: NEGATIVE mg/dL
Hgb urine dipstick: NEGATIVE
Leukocytes, UA: NEGATIVE
Protein, ur: NEGATIVE mg/dL
pH: 5.5 (ref 5.0–8.0)

## 2013-09-16 LAB — PROTIME-INR: Prothrombin Time: 19.4 seconds — ABNORMAL HIGH (ref 11.6–15.2)

## 2013-09-16 LAB — TROPONIN I: Troponin I: <0.3 ng/mL

## 2013-09-16 MED ORDER — RIVAROXABAN 20 MG PO TABS
20.0000 mg | ORAL_TABLET | Freq: Every day | ORAL | Status: DC
  Administered 2013-09-17 – 2013-09-20 (×4): 20 mg via ORAL
  Filled 2013-09-16 (×5): qty 1

## 2013-09-16 MED ORDER — ONDANSETRON HCL 4 MG PO TABS: 4.0000 mg | ORAL_TABLET | Freq: Four times a day (QID) | ORAL | Status: DC | PRN

## 2013-09-16 MED ORDER — NITROGLYCERIN 0.4 MG SL SUBL: 0.4000 mg | SUBLINGUAL_TABLET | SUBLINGUAL | Status: DC | PRN

## 2013-09-16 MED ORDER — ISOSORBIDE MONONITRATE ER 30 MG PO TB24
30.0000 mg | ORAL_TABLET | Freq: Every morning | ORAL | Status: DC
  Filled 2013-09-16: qty 1

## 2013-09-16 MED ORDER — HYDROMORPHONE HCL PF 1 MG/ML IJ SOLN
1.0000 mg | Freq: Once | INTRAMUSCULAR | Status: AC
  Administered 2013-09-16: 1 mg via INTRAVENOUS
  Filled 2013-09-16: qty 1

## 2013-09-16 MED ORDER — OXYCODONE HCL 5 MG PO TABS
10.0000 mg | ORAL_TABLET | ORAL | Status: DC | PRN
  Administered 2013-09-16: 10 mg via ORAL
  Administered 2013-09-17 – 2013-09-20 (×3): 20 mg via ORAL
  Administered 2013-09-20: 10 mg via ORAL
  Filled 2013-09-16 (×2): qty 2
  Filled 2013-09-16 (×3): qty 4

## 2013-09-16 MED ORDER — RIVAROXABAN 10 MG PO TABS
10.0000 mg | ORAL_TABLET | ORAL | Status: AC
  Administered 2013-09-16: 10 mg via ORAL
  Filled 2013-09-16: qty 1

## 2013-09-16 MED ORDER — SODIUM CHLORIDE 0.9 % IV SOLN: INTRAVENOUS | Status: AC

## 2013-09-16 MED ORDER — HYDROMORPHONE HCL PF 1 MG/ML IJ SOLN
1.0000 mg | INTRAMUSCULAR | Status: DC | PRN
  Administered 2013-09-16 – 2013-09-18 (×10): 1 mg via INTRAVENOUS
  Filled 2013-09-16 (×10): qty 1

## 2013-09-16 MED ORDER — INSULIN LISPRO 100 UNIT/ML (KWIKPEN): 30.0000 [IU] | PEN_INJECTOR | Freq: Every morning | SUBCUTANEOUS | Status: DC

## 2013-09-16 MED ORDER — ZOLPIDEM TARTRATE 5 MG PO TABS
5.0000 mg | ORAL_TABLET | Freq: Every evening | ORAL | Status: DC | PRN
  Administered 2013-09-16 – 2013-09-20 (×5): 5 mg via ORAL
  Filled 2013-09-16 (×5): qty 1

## 2013-09-16 MED ORDER — ASPIRIN EC 81 MG PO TBEC
81.0000 mg | DELAYED_RELEASE_TABLET | Freq: Every day | ORAL | Status: DC
  Administered 2013-09-17 – 2013-09-21 (×5): 81 mg via ORAL
  Filled 2013-09-16 (×5): qty 1

## 2013-09-16 MED ORDER — LISINOPRIL 40 MG PO TABS
40.0000 mg | ORAL_TABLET | Freq: Every morning | ORAL | Status: DC
  Filled 2013-09-16: qty 1

## 2013-09-16 MED ORDER — INSULIN ASPART 100 UNIT/ML ~~LOC~~ SOLN: 30.0000 [IU] | Freq: Every day | SUBCUTANEOUS | Status: DC

## 2013-09-16 MED ORDER — INSULIN ASPART 100 UNIT/ML ~~LOC~~ SOLN: 0.0000 [IU] | Freq: Every day | SUBCUTANEOUS | Status: DC

## 2013-09-16 MED ORDER — FENTANYL CITRATE 0.05 MG/ML IJ SOLN: 25.0000 ug | Freq: Once | INTRAMUSCULAR | Status: DC

## 2013-09-16 MED ORDER — DILTIAZEM LOAD VIA INFUSION
20.0000 mg | Freq: Once | INTRAVENOUS | Status: AC
  Administered 2013-09-16: 20 mg via INTRAVENOUS

## 2013-09-16 MED ORDER — GABAPENTIN 300 MG PO CAPS
300.0000 mg | ORAL_CAPSULE | Freq: Three times a day (TID) | ORAL | Status: DC
  Administered 2013-09-16 – 2013-09-21 (×14): 300 mg via ORAL
  Filled 2013-09-16 (×16): qty 1

## 2013-09-16 MED ORDER — SODIUM CHLORIDE 0.9 % IV SOLN
INTRAVENOUS | Status: DC
  Administered 2013-09-16: 1 mL via INTRAVENOUS

## 2013-09-16 MED ORDER — OXYCODONE-ACETAMINOPHEN 5-325 MG PO TABS
1.0000 | ORAL_TABLET | ORAL | Status: DC | PRN
  Administered 2013-09-17 – 2013-09-19 (×5): 2 via ORAL
  Administered 2013-09-19: 1 via ORAL
  Administered 2013-09-19 – 2013-09-21 (×7): 2 via ORAL
  Filled 2013-09-16: qty 1
  Filled 2013-09-16 (×12): qty 2
  Filled 2013-09-16: qty 1

## 2013-09-16 MED ORDER — POTASSIUM CHLORIDE IN NACL 20-0.9 MEQ/L-% IV SOLN
INTRAVENOUS | Status: DC
  Administered 2013-09-16: 23:00:00 via INTRAVENOUS
  Filled 2013-09-16 (×2): qty 1000

## 2013-09-16 MED ORDER — GLIPIZIDE 10 MG PO TABS
10.0000 mg | ORAL_TABLET | Freq: Every day | ORAL | Status: DC
  Filled 2013-09-16: qty 1

## 2013-09-16 MED ORDER — RIVAROXABAN 10 MG PO TABS: 10.0000 mg | ORAL_TABLET | Freq: Every day | ORAL | Status: DC

## 2013-09-16 MED ORDER — ONDANSETRON HCL 4 MG/2ML IJ SOLN: 4.0000 mg | Freq: Four times a day (QID) | INTRAMUSCULAR | Status: DC | PRN

## 2013-09-16 MED ORDER — DOCUSATE SODIUM 100 MG PO CAPS
100.0000 mg | ORAL_CAPSULE | Freq: Two times a day (BID) | ORAL | Status: DC
  Administered 2013-09-16 – 2013-09-20 (×9): 100 mg via ORAL
  Filled 2013-09-16 (×9): qty 1

## 2013-09-16 MED ORDER — DILTIAZEM HCL 100 MG IV SOLR
5.0000 mg/h | Freq: Once | INTRAVENOUS | Status: AC
  Administered 2013-09-16: 5 mg/h via INTRAVENOUS

## 2013-09-16 MED ORDER — ATORVASTATIN CALCIUM 20 MG PO TABS
20.0000 mg | ORAL_TABLET | Freq: Every day | ORAL | Status: DC
  Administered 2013-09-17 – 2013-09-20 (×4): 20 mg via ORAL
  Filled 2013-09-16 (×6): qty 1

## 2013-09-16 MED ORDER — HYDROMORPHONE HCL PF 1 MG/ML IJ SOLN: 1.0000 mg | INTRAMUSCULAR | Status: DC | PRN

## 2013-09-16 MED ORDER — INSULIN ASPART 100 UNIT/ML ~~LOC~~ SOLN
0.0000 [IU] | Freq: Three times a day (TID) | SUBCUTANEOUS | Status: DC
  Administered 2013-09-17: 2 [IU] via SUBCUTANEOUS

## 2013-09-16 MED ORDER — MORPHINE SULFATE ER 15 MG PO TBCR
60.0000 mg | EXTENDED_RELEASE_TABLET | Freq: Two times a day (BID) | ORAL | Status: DC
  Administered 2013-09-16 – 2013-09-21 (×10): 60 mg via ORAL
  Filled 2013-09-16 (×10): qty 4

## 2013-09-16 MED ORDER — INSULIN GLARGINE 100 UNIT/ML ~~LOC~~ SOLN
60.0000 [IU] | Freq: Every morning | SUBCUTANEOUS | Status: DC
  Filled 2013-09-16: qty 0.6

## 2013-09-16 MED ORDER — FENTANYL CITRATE 0.05 MG/ML IJ SOLN
12.5000 ug | Freq: Once | INTRAMUSCULAR | Status: AC
  Administered 2013-09-16: 100 ug via INTRAVENOUS
  Filled 2013-09-16: qty 2

## 2013-09-16 MED ORDER — FERROUS SULFATE 325 (65 FE) MG PO TABS
325.0000 mg | ORAL_TABLET | Freq: Three times a day (TID) | ORAL | Status: DC
Start: 2013-09-17 — End: 2013-09-21
  Administered 2013-09-17 – 2013-09-21 (×13): 325 mg via ORAL
  Filled 2013-09-16 (×16): qty 1

## 2013-09-16 MED ORDER — SODIUM CHLORIDE 0.9 % IJ SOLN
3.0000 mL | Freq: Two times a day (BID) | INTRAMUSCULAR | Status: DC
  Administered 2013-09-19 – 2013-09-21 (×4): 3 mL via INTRAVENOUS

## 2013-09-16 MED ORDER — DEXTROSE 5 % IV SOLN
5.0000 mg/h | INTRAVENOUS | Status: DC
  Administered 2013-09-16 – 2013-09-18 (×6): 15 mg/h via INTRAVENOUS
  Administered 2013-09-18 – 2013-09-19 (×5): 20 mg/h via INTRAVENOUS
  Administered 2013-09-19: 15 mg/h via INTRAVENOUS
  Administered 2013-09-19: 20 mg/h via INTRAVENOUS
  Administered 2013-09-20: 10 mg/h via INTRAVENOUS
  Filled 2013-09-16 (×7): qty 100

## 2013-09-16 NOTE — H&P (Signed)
[facility] History and Physical  Patient: Thomas [name]  WGN:[id number]  DOB: [birth year]  DOS: the patient was seen and examined on 09/16/2013 PCP: [name], MD  Chief Complaint: Elevated heart rate  HPI: Chasyn Cinque is a 72 y.o. male with Past medical history of diabetes mellitus, coronary disease, hypertension, peripheral vascular disease, AAA, CK-MB, osteoarthritis with repeated hip arthroplasty. The patient is coming from SNF. The patient is presenting with complaints of elevated heart rate. He mentions that since last 5 days due to his daily evaluation in the rehabilitation facility they have found that his heart rate has been persistently elevated. Patient mentions"they have tried to lower the heart rate but they couldn't". Along with that the patient mentions that he had pain in his hip that is persistent since the surgery and increasing the worsening since last few days. The pain is located primarily in the hip and worsens with movement and radiates to his knee. He does mentions that post surgery he always have some swelling of his legs which is not worsening and he also denies any tenderness in his legs. Patient denies any complaints of chest pain or palpitation or shortness of breath. He mentions occasional dizziness. He denies any fever chills cough orthopnea or PND. He mentions he is compliant with all the medications. He also mentions his urinary frequency has also reduced. He denies any constipation.  Review of Systems: as mentioned in the history of present illness.  A Comprehensive review of the other systems is negative.  Past Medical History  Diagnosis Date  . Diabetes mellitus   . CAD (coronary artery disease)   . Hypertension   . Claudication in peripheral vascular disease 07/08/2011    Left external iliac occlusion with collaterals; mild-moderate right external iliac disease; moderate calcified left popliteal disease  . Carotid artery disease 2012    < 50%  stenosis bilaterally  . Hyperlipidemia   . AAA (abdominal aortic aneurysm)   . Chronic kidney disease     hx kidney stones  . Arthritis   . 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 Shoulder surgery      right  . Carpal tunnel release  2006    right   . Hip fracture surgery  11/2007    right  . Joint replacement  2009    right total hip replacement  . Coronary angioplasty with stent placement  05/30/2007    Mid RCA 3.0 x 15 mm vision BMS, distal RCA 2.5 x 12 mm vision BMS, PDA 2.0 x 12 mm vision BMS  . Tonsillectomy      as child  . Total hip revision Right 09/03/2013    Procedure: RIGHT TOTAL HIP REVISION;  Surgeon: Shelda Pal, MD;  Location: [facility];  Service: Orthopedics;  Laterality: Right;  . Atrial flutter ablation  11/03/2006  . Coronary angioplasty with stent placement  10/06/2007    LM 25, LAD 25, D1 50, D2 50, CFX mild dz, OM1 diffuse dz, OM 2 100 w/ L-L collaterals, mid-RCA stent with mild ISR, distal RCA stent with > 90 ISR, subtotal PL, Rx w/ 2.5 x 15-mm Promus DES. EF 55%,    Social History:  reports that he quit smoking about 7 years ago. His smoking use included Cigarettes. He smoked 0.00 packs per day. He has never used smokeless tobacco. He reports that he drinks alcohol. He reports that he does not use  illicit drugs. Independent for most of his  ADL.  Allergies  Allergen Reactions  . Nsaids Diarrhea    Family History  Problem Relation Age of Onset  . Emphysema Mother   . Cancer Father     Brain  . Diabetes Son     Prior to Admission medications   Medication Sig Start Date End Date Taking? Authorizing Provider  APIDRA SOLOSTAR 100 UNIT/ML injection Inject 15 Units as directed every morning.  12/18/12  Yes Historical Provider, MD  aspirin EC 81 MG tablet Take 81 mg by mouth daily.    Yes Historical Provider, MD  diltiazem (CARDIZEM CD) 180 MG 24 hr capsule Take 1 capsule  (180 mg total) by mouth daily. 09/07/13  Yes Shelda Pal, MD  docusate sodium 100 MG CAPS Take 100 mg by mouth 2 (two) times daily. 09/06/13  Yes Genelle Gather Babish, PA-C  fenofibrate micronized (LOFIBRA) 134 MG capsule Take 134 mg by mouth daily before breakfast.    Yes Historical Provider, MD  ferrous sulfate 325 (65 FE) MG tablet Take 1 tablet (325 mg total) by mouth 3 (three) times daily after meals. 09/06/13  Yes Genelle Gather Babish, PA-C  fish oil-omega-3 fatty acids 1000 MG capsule Take 1 g by mouth daily.   Yes Historical Provider, MD  gabapentin (NEURONTIN) 300 MG capsule Take 300 mg by mouth 3 (three) times daily. 1 am, 1 lunch, 1 bedtime   Yes Historical Provider, MD  glipiZIDE (GLUCOTROL) 10 MG tablet Take 10 mg by mouth daily.    Yes Historical Provider, MD  HUMALOG KWIKPEN 100 UNIT/ML SOPN Inject 30 Units as directed every morning.  05/21/13  Yes Historical Provider, MD  insulin glargine (LANTUS) 100 UNIT/ML injection Inject 60 Units into the skin every morning.    Yes Historical Provider, MD  isosorbide mononitrate (IMDUR) 30 MG 24 hr tablet Take 30 mg by mouth every morning.  11/28/12  Yes Everette Rank, MD  lisinopril (PRINIVIL,ZESTRIL) 40 MG tablet Take 40 mg by mouth every morning.    Yes Everette Rank, MD  metFORMIN (GLUCOPHAGE) 500 MG tablet Take 1,000 mg by mouth daily.    Yes Historical Provider, MD  morphine (MS CONTIN) 30 MG 12 hr tablet Take 60 mg by mouth every 12 (twelve) hours.    Yes Historical Provider, MD  nitroGLYCERIN (NITROSTAT) 0.4 MG SL tablet Place 0.4 mg under the tongue every 5 (five) minutes as needed.    Yes Historical Provider, MD  oxyCODONE 10 MG TABS Take 1-2 tablets (10-20 mg total) by mouth every 4 (four) hours. 09/07/13  Yes Shelda Pal, MD  rivaroxaban (XARELTO) 10 MG TABS tablet Take 10 mg by mouth daily.   Yes Historical Provider, MD  rosuvastatin (CRESTOR) 10 MG tablet Take 10 mg by mouth every morning.    Yes Historical Provider, MD   saccharomyces boulardii (FLORASTOR) 250 MG capsule Take 250 mg by mouth daily.   Yes Historical Provider, MD  zolpidem (AMBIEN) 10 MG tablet Take 10 mg by mouth at bedtime as needed for sleep.    Yes Historical Provider, MD    Physical Exam: Filed Vitals:   09/16/13 2030 09/16/13 2033 09/16/13 2038 09/16/13 2045  BP: 114/54  114/54 113/60  Pulse: 143   144  Temp:  98 F (36.7 C) 98 F (36.7 C)   TempSrc:      Resp: 22  18 10   SpO2: 98% 100% 94% 98%    General: Alert, Awake and Oriented to  Time, Place and Person. Appear in marked distress Eyes: PERRL ENT: Oral Mucosa clear moist. Neck: No JVD Cardiovascular: S1 and S2 Present, no Murmur, Peripheral Pulses Present Respiratory: Bilateral Air entry equal and Decreased, Clear to Auscultation,  No Crackles, no wheezes Abdomen: Bowel Sound Present, Soft and Non tender Skin: No Rash Extremities: Right Pedal edema, no calf tenderness, extreme tenderness on palpation and movement of right hip and knee, no warmth or erythema. Neurologic: Grossly Unremarkable.  Labs on Admission:  CBC:  Recent Labs Lab 09/16/13 1757  WBC 6.8  HGB 9.7*  HCT 30.1*  MCV 92.0  PLT 339    CMP     Component Value Date/Time   NA 134* 09/16/2013 1757   K 3.6 09/16/2013 1757   CL 97 09/16/2013 1757   CO2 27 09/16/2013 1757   GLUCOSE 140* 09/16/2013 1757   BUN 14 09/16/2013 1757   CREATININE 0.84 09/16/2013 1757   CALCIUM 8.8 09/16/2013 1757   PROT 6.8 09/16/2013 1757   ALBUMIN 3.1* 09/16/2013 1757   AST 22 09/16/2013 1757   ALT 14 09/16/2013 1757   ALKPHOS 112 09/16/2013 1757   BILITOT 0.4 09/16/2013 1757   GFRNONAA 87* 09/16/2013 1757   GFRAA >90 09/16/2013 1757    No results found for this basename: LIPASE, AMYLASE,  in the last 168 hours No results found for this basename: AMMONIA,  in the last 168 hours   Recent Labs Lab 09/16/13 1805  TROPONINI <0.30   BNP (last 3 results) No results found for this basename: PROBNP,  in the last 8760  hours  Radiological Exams on Admission: Dg Chest Port 1 View  09/16/2013   CLINICAL DATA:  Cardiac arrhythmia  EXAM: PORTABLE CHEST - 1 VIEW  COMPARISON:  08/30/2013.  FINDINGS: The heart size and mediastinal contours are within normal limits. Mild tortuosity and calcification of the thoracic aorta is again demonstrated. Both lungs are clear. The visualized skeletal structures are unremarkable.  IMPRESSION: No acute cardiopulmonary findings.   Electronically Signed   By: Loralie Champagne M.D.   On: 09/16/2013 18:38    EKG: Independently reviewed. atrial fibrillation, RVR  Assessment/Plan Principal Problem:   Atrial fibrillation with RVR Active Problems:   S/P right TH revision   Diabetes mellitus   CAD (coronary artery disease)   Hypertension   1. Atrial fibrillation with RVR The patient is presenting with A. fib with RVR, he does have history of the same and was discharged on Cardizem after his recent hospitalization. At present he has been started on Cardizem drip and we will continue to monitor him on telemetry and stepdown unit. He was on Xarelto 10 mg off of the discharge for postoperative DVT prophylaxis I will increase his c Xarelto to 20 mg for anticoagulation for A. fib which was recommended and agreed upon those during his last hospitalization. More likely his A. fib is driven by his severe pain in his hip. I would also obtain serial troponins  2. Status post right hip total arthroplasty revision I have discussed the case with on-call orthopedic Dr. Victorino Dike, he recommended lower extremity duplex, I would also obtain x-ray of his hip and pelvis. For the pain management he will continue his MS Contin and oxycodone IR and his prior dose. I would add Dilaudid when necessary. I would also add Percocet when necessary. 4 bowel regimen I would continue him on Colace twice a day and add MiraLax  3. Diabetes mellitus Continue home dose of insulin and placed  the patient on sliding  scale  4. Hypertension Resuming antihypertensive tomorrow in view of his A. fib and borderline blood pressure  Consults: Cardiology and orthopedics, appreciate their input.  DVT Prophylaxis: mechanical compression device Nutrition: Cardiac and diabetic diet  Code Status: Full  Family Communication: Wife was present at bedside, opportunity was given to ask question and all questions were answered satisfactorily at the time of interview. Disposition: Admitted to inpatient in step-down unit.  Author: Lynden Oxford, MD [facility] Pager: [phone] 09/16/2013, 9:00 PM    If 7PM-7AM, please contact night-coverage [email or web address] Password [email or web address]

## 2013-09-16 NOTE — Consult Note (Signed)
Reason for Consult: atrial flutter Referring Physician: Dr. Amie Critchley Thomas [name] is an 72 y.o. male.  HPI: Thomas [name] is a 72 yo man with known CAD, previous atrial flutter with ablation many years ago with recent right total hip surgery 11/24-11/28 hospitalization with significant pain and atrial flutter requiring treatment with diltiazem with ultimately spontaneous cardioversion before transfer to rehabilitation who comes in today with recurrent palpitations and found to have tachycardia to 140s-150s with significant knee pain. He tells me his primary complaint is right hip pain which he tells me has not been controlled. He does note some palpitations but he really only knows he has tachycardia because they check his HR at his SNF. He doesn't note any fever; perhaps some chills, no nausea/vomiting/diarrhea. No syncope/dizziness. He really just has significant joint pain that is chronic with a subacute component. In the ER he received bolus + drip of diltiazem with improvement of rate control to 110s before resuming faster. A second bolus and increased drip rate ensued with mildly improved HR leading to cardiology consultation. He received dilaudid in the ER with some mild improvement in pain.    Past Medical History  Diagnosis Date  . Diabetes mellitus   . CAD (coronary artery disease)   . Hypertension   . Claudication in peripheral vascular disease 07/08/2011    Left external iliac occlusion with collaterals; mild-moderate right external iliac disease; moderate calcified left popliteal disease  . Carotid artery disease 2012    < 50% stenosis bilaterally  . Hyperlipidemia   . AAA (abdominal aortic aneurysm)   . Chronic kidney disease     hx kidney stones  . Arthritis   . 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 Shoulder surgery      right  . Carpal tunnel release  2006    right   . Hip fracture surgery   11/2007    right  . Joint replacement  2009    right total hip replacement  . Coronary angioplasty with stent placement  05/30/2007    Mid RCA 3.0 x 15 mm vision BMS, distal RCA 2.5 x 12 mm vision BMS, PDA 2.0 x 12 mm vision BMS  . Tonsillectomy      as child  . Total hip revision Right 09/03/2013    Procedure: RIGHT TOTAL HIP REVISION;  Surgeon: Shelda Pal, MD;  Location: [facility];  Service: Orthopedics;  Laterality: Right;  . Atrial flutter ablation  11/03/2006  . Coronary angioplasty with stent placement  10/06/2007    LM 25, LAD 25, D1 50, D2 50, CFX mild dz, OM1 diffuse dz, OM 2 100 w/ L-L collaterals, mid-RCA stent with mild ISR, distal RCA stent with > 90 ISR, subtotal PL, Rx w/ 2.5 x 15-mm Promus DES. EF 55%,     Family History  Problem Relation Age of Onset  . Emphysema Mother   . Cancer Father     Brain  . Diabetes Son     Social History:  reports that he quit smoking about 7 years ago. His smoking use included Cigarettes. He smoked 0.00 packs per day. He has never used smokeless tobacco. He reports that he drinks alcohol. He reports that he does not use illicit drugs.  Allergies:  Allergies  Allergen Reactions  . Nsaids Diarrhea    Medications: I have reviewed the patient's current medications. Prior to Admission:  (Not in  a hospital admission) Scheduled:   Results for orders placed during the hospital encounter of 09/16/13 (from the past 48 hour(s))  CBC     Status: Abnormal   Collection Time    09/16/13  5:57 PM      Result Value Range   WBC 6.8  4.0 - 10.5 K/uL   RBC 3.27 (*) 4.22 - 5.81 MIL/uL   Hemoglobin 9.7 (*) 13.0 - 17.0 g/dL   HCT 16.1 (*) 09.6 - 04.5 %   MCV 92.0  78.0 - 100.0 fL   MCH 29.7  26.0 - 34.0 pg   MCHC 32.2  30.0 - 36.0 g/dL   RDW 40.9  81.1 - 91.4 %   Platelets 339  150 - 400 K/uL  COMPREHENSIVE METABOLIC PANEL     Status: Abnormal   Collection Time    09/16/13  5:57 PM      Result Value Range   Sodium 134 (*) 135 - 145 mEq/L    Potassium 3.6  3.5 - 5.1 mEq/L   Chloride 97  96 - 112 mEq/L   CO2 27  19 - 32 mEq/L   Glucose, Bld 140 (*) 70 - 99 mg/dL   BUN 14  6 - 23 mg/dL   Creatinine, Ser 7.82  0.50 - 1.35 mg/dL   Calcium 8.8  8.4 - 95.6 mg/dL   Total Protein 6.8  6.0 - 8.3 g/dL   Albumin 3.1 (*) 3.5 - 5.2 g/dL   AST 22  0 - 37 U/L   ALT 14  0 - 53 U/L   Alkaline Phosphatase 112  39 - 117 U/L   Total Bilirubin 0.4  0.3 - 1.2 mg/dL   GFR calc non Af Amer 87 (*) >90 mL/min   GFR calc Af Amer >90  >90 mL/min   Comment: (NOTE)     The eGFR has been calculated using the CKD EPI equation.     This calculation has not been validated in all clinical situations.     eGFR's persistently <90 mL/min signify possible Chronic Kidney     Disease.  PROTIME-INR     Status: Abnormal   Collection Time    09/16/13  5:57 PM      Result Value Range   Prothrombin Time 19.4 (*) 11.6 - 15.2 seconds   INR 1.69 (*) 0.00 - 1.49  TROPONIN I     Status: None   Collection Time    09/16/13  6:05 PM      Result Value Range   Troponin I <0.30  <0.30 ng/mL   Comment:            Due to the release kinetics of cTnI,     a negative result within the first hours     of the onset of symptoms does not rule out     myocardial infarction with certainty.     If myocardial infarction is still suspected,     repeat the test at appropriate intervals.    Dg Chest Port 1 View  09/16/2013   CLINICAL DATA:  Cardiac arrhythmia  EXAM: PORTABLE CHEST - 1 VIEW  COMPARISON:  08/30/2013.  FINDINGS: The heart size and mediastinal contours are within normal limits. Mild tortuosity and calcification of the thoracic aorta is again demonstrated. Both lungs are clear. The visualized skeletal structures are unremarkable.  IMPRESSION: No acute cardiopulmonary findings.   Electronically Signed   By: Loralie Champagne M.D.   On: 09/16/2013 18:38  Review of Systems  Constitutional: Positive for chills and malaise/fatigue. Negative for fever.  HENT: Negative for  ear pain.   Eyes: Negative for double vision and photophobia.  Respiratory: Negative for cough, hemoptysis and sputum production.   Cardiovascular: Positive for palpitations and leg swelling. Negative for chest pain and orthopnea.  Gastrointestinal: Negative for nausea, vomiting, abdominal pain and blood in stool.  Genitourinary: Negative for dysuria and hematuria.  Musculoskeletal: Positive for joint pain. Negative for myalgias.  Skin: Negative for itching and rash.  Neurological: Negative for tingling, tremors and headaches.  Endo/Heme/Allergies: Negative for environmental allergies and polydipsia.  Psychiatric/Behavioral: Negative for suicidal ideas and substance abuse.   Blood pressure 115/60, pulse 145, temperature 98.5 F (36.9 C), temperature source Oral, resp. rate 21, SpO2 97.00%. Physical Exam  Nursing note and vitals reviewed. Constitutional: He is oriented to person, place, and time. He appears well-developed and well-nourished. He appears distressed.  Mildly uncomfortable due to right hip pain  HENT:  Head: Normocephalic and atraumatic.  Nose: Nose normal.  Mouth/Throat: Oropharynx is clear and moist. No oropharyngeal exudate.  Eyes: Conjunctivae and EOM are normal. Pupils are equal, round, and reactive to light. No scleral icterus.  Neck: Normal range of motion. Neck supple. No JVD present. No tracheal deviation present. No thyromegaly present.  Cardiovascular: Normal heart sounds and intact distal pulses.   No murmur heard. Tachycardic, regular  Respiratory: Effort normal and breath sounds normal. No respiratory distress. He has no wheezes.  GI: Soft. Bowel sounds are normal. He exhibits no distension. There is no tenderness.  Musculoskeletal: Normal range of motion. He exhibits edema.  Trace pedal edema  Neurological: He is alert and oriented to person, place, and time. No cranial nerve deficit. Coordination normal.  Skin: Skin is warm and dry. No rash noted. He is not  diaphoretic. No erythema.  Psychiatric: He has a normal mood and affect. His behavior is normal. Thought content normal.   Labs reveiwed wbc 6.8, h/h 9.7/30.1, plt 300s, na 134, K 3.6, bun/cr 14/0.84, albumin 3.1, INR 1.7, Trop < 0.3 Echo 11/27: EF 55-60%, PASP 37 mmHg, grade III DD,  EKG x 2: Tachycardia, somewhat regular 150, RBBB, LVH  Problem List Atrial flutter with RVR Right leg/hip pain Known Coronary Artery Disease Hypertension Dyslipidemia Anemia T2DM Grade III DDD with preserved EF on Echo EKG with RBBB, LVH, Tachycardia Assessment/Plan: 72 yo man with known history of CAD, T2DM, hypertension, dyslipidemia, recent 11/24-11/28 hospitalization for right hip replacement who returns from rehabilitation with palpitations and found to have atrial flutter with RVR. Triggers for atrial fibrillation include infection, pain, heart failure, ischemia, among other etiologies. I favor a diagnosis of pain and anxiety. He has had significant issues with his right hip and just had another right hip replacement 11/24-11/28 hospitalization. There may be another trigger but this seems the most likely. I favor pain control, rate control as able with diltiazem gtt and consider DCCV if he does not spontaneously convert. He would need TEE before as he has not been anticoagulation for > 21 days.  - continue rivaroxaban, rate control with diltiazem IV for now; he has stable hemodyanmics - would continue crestor 10 mg daily, lisinopril 40 mg daily (as bp tolerates), imdur 30 mg daily - NPO after MN for potential TEE DCCV - pursue pain control; ? Pain consult - reasonable to trend ischemias at least once more - evaluate for infection (urinalysis unrevealing, chest x-ray unrevealing    Thomas [name] 09/16/2013, 7:36 PM

## 2013-09-16 NOTE — Consult Note (Signed)
PHARMACY CONSULT NOTE  Pharmacy Consult:  Xarelto Indication: Non-valvular atrial fibrillation w/RVR   Allergies: Allergies  Allergen Reactions  . Nsaids Diarrhea    Height/Weight: Height: 5\' 11"  (180.3 cm) Weight: 180 lb 12.4 oz (82 kg) IBW/kg (Calculated) : 75.3   Vital Signs: BP 125/65  Pulse 143  Temp(Src) 98.9 F (37.2 C) (Oral)  Resp 20  Ht 5\' 11"  (1.803 m)  Wt 180 lb 12.4 oz (82 kg)  BMI 25.22 kg/m2  SpO2 98%  Active Problems: Active Problems:    Atrial fibrillation with RVR   Labs:  Recent Labs  09/16/13 1757  HGB 9.7*  HCT 30.1*  PLT 339  LABPROT 19.4*  INR 1.69*  CREATININE 0.84   Estimated Creatinine Clearance: 87.2 ml/min (by C-G formula based on Cr of 0.84).  Medical / Surgical History: Past Medical History  Diagnosis Date  . Diabetes mellitus   . CAD (coronary artery disease)   . Hypertension   . Claudication in peripheral vascular disease 07/08/2011    Left external iliac occlusion with collaterals; mild-moderate right external iliac disease; moderate calcified left popliteal disease  . Carotid artery disease 2012    < 50% stenosis bilaterally  . Hyperlipidemia   . AAA (abdominal aortic aneurysm)   . Chronic kidney disease     hx kidney stones  . Arthritis   . 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 Shoulder surgery      right  . Carpal tunnel release  2006    right   . Hip fracture surgery  11/2007    right  . Joint replacement  2009    right total hip replacement  . Coronary angioplasty with stent placement  05/30/2007    Mid RCA 3.0 x 15 mm vision BMS, distal RCA 2.5 x 12 mm vision BMS, PDA 2.0 x 12 mm vision BMS  . Tonsillectomy      as child  . Total hip revision Right 09/03/2013    Procedure: RIGHT TOTAL HIP REVISION;  Surgeon: Shelda Pal, MD;  Location: [facility];  Service: Orthopedics;  Laterality: Right;  . Atrial flutter ablation   11/03/2006  . Coronary angioplasty with stent placement  10/06/2007    LM 25, LAD 25, D1 50, D2 50, CFX mild dz, OM1 diffuse dz, OM 2 100 w/ L-L collaterals, mid-RCA stent with mild ISR, distal RCA stent with > 90 ISR, subtotal PL, Rx w/ 2.5 x 15-mm Promus DES. EF 55%,     Medications:  Medication Sig  . APIDRA SOLOSTAR 100 UNIT/ML injection Inject 15 Units as directed every morning.   Marland Kitchen aspirin EC 81 MG tablet Take 81 mg by mouth daily.   Marland Kitchen diltiazem (CARDIZEM CD) 180 MG 24 hr capsule Take 1 capsule (180 mg total) by mouth daily.  Marland Kitchen docusate sodium 100 MG CAPS Take 100 mg by mouth 2 (two) times daily.  . fenofibrate micronized (LOFIBRA) 134 MG capsule Take 134 mg by mouth daily before breakfast.   . ferrous sulfate 325 (65 FE) MG tablet Take 1 tablet (325 mg total) by mouth 3 (three) times daily after meals.  . fish oil-omega-3 fatty acids 1000 MG capsule Take 1 g by mouth daily.  Marland Kitchen gabapentin (NEURONTIN) 300 MG capsule Take 300 mg by mouth 3 (three) times daily. 1 am, 1 lunch, 1 bedtime  . glipiZIDE (GLUCOTROL) 10 MG tablet Take 10 mg by mouth  daily.   Marland Kitchen HUMALOG KWIKPEN 100 UNIT/ML SOPN Inject 30 Units as directed every morning.   . insulin glargine (LANTUS) 100 UNIT/ML injection Inject 60 Units into the skin every morning.   . isosorbide mononitrate (IMDUR) 30 MG 24 hr tablet Take 30 mg by mouth every morning.   Marland Kitchen lisinopril (PRINIVIL,ZESTRIL) 40 MG tablet Take 40 mg by mouth every morning.   . metFORMIN (GLUCOPHAGE) 500 MG tablet Take 1,000 mg by mouth daily.   Marland Kitchen morphine (MS CONTIN) 30 MG 12 hr tablet Take 60 mg by mouth every 12 (twelve) hours.   . nitroGLYCERIN (NITROSTAT) 0.4 MG SL tablet Place 0.4 mg under the tongue every 5 (five) minutes as needed.   Marland Kitchen oxyCODONE 10 MG TABS Take 1-2 tablets (10-20 mg total) by mouth every 4 (four) hours.  Marland Kitchen RIVAROXABAN (XARELTO) 10 MG TABS tablet Take 10 mg by mouth daily.  [ VTE prophylaxis dose post-op ortho surgery ]  . rosuvastatin (CRESTOR) 10  MG tablet Take 10 mg by mouth every morning.   . saccharomyces boulardii (FLORASTOR) 250 MG capsule Take 250 mg by mouth daily.  Marland Kitchen zolpidem (AMBIEN) 10 MG tablet Take 10 mg by mouth at bedtime as needed for sleep.    Scheduled:  . sodium chloride   Intravenous STAT  . aspirin EC  81 mg Oral Daily  . [START ON 09/17/2013] atorvastatin  20 mg Oral q1800  . docusate sodium  100 mg Oral BID  . [START ON 09/17/2013] ferrous sulfate  325 mg Oral TID PC  . gabapentin  300 mg Oral TID  . glipiZIDE  10 mg Oral Daily  . [START ON 09/17/2013] insulin aspart  0-15 Units Subcutaneous TID WC  . insulin aspart  0-5 Units Subcutaneous QHS  . [START ON 09/17/2013] insulin glargine  60 Units Subcutaneous q morning - 10a  . [START ON 09/17/2013] insulin lispro  30 Units Subcutaneous q morning - 10a  . [START ON 09/17/2013] isosorbide mononitrate  30 mg Oral q morning - 10a  . [START ON 09/17/2013] lisinopril  40 mg Oral q morning - 10a  . morphine  60 mg Oral Q12H  . rivaroxaban  10 mg Oral Daily  . sodium chloride  3 mL Intravenous Q12H    Assessment:  72 y.o.male with recent hip surgery and on chronic Xarelto 10 mg daily for VTE prophylaxis now admitted with atrial fibrillation w/RVR.  Pharmacy consulted to dose Xarelto for non-valvular atrial fibrillation .    Patient has received Xarelto 10 mg earlier today, his current home dose.  Renal function with CrCl 87 ml/min.  Hgb 9.7,  Plt 339.  No bleeding complications noted   Goal of Therapy:   Full anticoagulation doses of Xarelto for atrial fibrillation       Plan:  1. Give an additional dose of Xarelto 10 mg tonight for total of 20 mg Xarelto today, then 2. Change Xarelto to 20 mg daily given with the evening meal.  Next dose 09/17/13.   3. Monitor for bleeding complications   Laurena Bering,  Pharm.D.. 09/16/2013,  9:43 PM

## 2013-09-16 NOTE — ED Notes (Signed)
Pt from Rehab, c/o rapid heart rate. Pt sp right hip replacement x weeks. Per EMS rehab facility sent for eval of tachycardia. Pt states rapid HR 140-150s since surgery, denies sob, cp, n/v. Pt states hip pain 8/10.

## 2013-09-16 NOTE — ED Provider Notes (Signed)
CSN: [id number]     Arrival date & time 09/16/13  1745 History   First MD Initiated Contact with Patient 09/16/13 1755     Chief Complaint  Patient presents with  . Tachycardia   (Consider location/radiation/quality/duration/timing/severity/associated sxs/prior Treatment) The history is provided by the patient and the EMS personnel.   Pt s/p right hip surgery approximated 2 weeks ago c/o constant pain in hip since surgery ('even before' surgery per pt), as well as states rapid heart beat for past few days.  Hx afib w rvr, w recent tx for same in hospital.  Pt denies chest discomfort or sob. No faintness or lightheadedness. Denies fever or chills. No problems w swelling or redness at incision. Post op denies infectious complications, no fevers, no gu symptoms, no cough or sob. States compliant w normal meds.    Past Medical History  Diagnosis Date  . Diabetes mellitus   . CAD (coronary artery disease)   . Hypertension   . Claudication in peripheral vascular disease 07/08/2011    Left external iliac occlusion with collaterals; mild-moderate right external iliac disease; moderate calcified left popliteal disease  . Carotid artery disease 2012    < 50% stenosis bilaterally  . Hyperlipidemia   . AAA (abdominal aortic aneurysm)   . Chronic kidney disease     hx kidney stones  . Arthritis   . 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 Shoulder surgery      right  . Carpal tunnel release  2006    right   . Hip fracture surgery  11/2007    right  . Joint replacement  2009    right total hip replacement  . Coronary angioplasty with stent placement  05/30/2007    Mid RCA 3.0 x 15 mm vision BMS, distal RCA 2.5 x 12 mm vision BMS, PDA 2.0 x 12 mm vision BMS  . Tonsillectomy      as child  . Total hip revision Right 09/03/2013    Procedure: RIGHT TOTAL HIP REVISION;  Surgeon: Shelda Pal, MD;  Location: [facility];  Service: Orthopedics;  Laterality: Right;  . Atrial flutter ablation  11/03/2006  . Coronary angioplasty with stent placement  10/06/2007    LM 25, LAD 25, D1 50, D2 50, CFX mild dz, OM1 diffuse dz, OM 2 100 w/ L-L collaterals, mid-RCA stent with mild ISR, distal RCA stent with > 90 ISR, subtotal PL, Rx w/ 2.5 x 15-mm Promus DES. EF 55%,    Family History  Problem Relation Age of Onset  . Emphysema Mother   . Cancer Father     Brain  . Diabetes Son    History  Substance Use Topics  . Smoking status: Former Smoker    Types: Cigarettes    Quit date: 10/11/2005  . Smokeless tobacco: Never Used  . Alcohol Use: Yes     Comment: occasionally    Review of Systems  Constitutional: Negative for fever and chills.  HENT: Negative for sore throat.   Eyes: Negative for redness.  Respiratory: Negative for cough and shortness of breath.   Cardiovascular: Positive for palpitations. Negative for chest pain and leg swelling.  Gastrointestinal: Negative for vomiting, abdominal pain and diarrhea.  Genitourinary: Negative for dysuria and flank pain.  Musculoskeletal: Negative for back pain and neck pain.  Skin: Negative for rash.  Neurological: Negative for headaches.  Hematological: Does not bruise/bleed  easily.  Psychiatric/Behavioral: Negative for confusion.    Allergies  Nsaids  Home Medications   Current Outpatient Rx  Name  Route  Sig  Dispense  Refill  . APIDRA SOLOSTAR 100 UNIT/ML injection   Injection   Inject 15 Units as directed every morning.          Marland Kitchen aspirin EC 81 MG tablet   Oral   Take 81 mg by mouth daily.          Marland Kitchen diltiazem (CARDIZEM CD) 180 MG 24 hr capsule   Oral   Take 1 capsule (180 mg total) by mouth daily.   60 capsule   0   . fenofibrate micronized (LOFIBRA) 134 MG capsule   Oral   Take 134 mg by mouth daily before breakfast.          . ferrous sulfate 325 (65 FE) MG tablet   Oral   Take 1 tablet (325 mg total) by mouth 3 (three)  times daily after meals.      3   . fish oil-omega-3 fatty acids 1000 MG capsule   Oral   Take 1 g by mouth daily.         Marland Kitchen gabapentin (NEURONTIN) 300 MG capsule   Oral   Take 300 mg by mouth 3 (three) times daily. 1 am, 1 lunch, 1 bedtime         . glipiZIDE (GLUCOTROL) 10 MG tablet   Oral   Take 10 mg by mouth daily.          Marland Kitchen HUMALOG KWIKPEN 100 UNIT/ML SOPN   Injection   Inject 30 Units as directed every morning.          . insulin glargine (LANTUS) 100 UNIT/ML injection   Subcutaneous   Inject 60 Units into the skin every morning.          . isosorbide mononitrate (IMDUR) 30 MG 24 hr tablet   Oral   Take 30 mg by mouth every morning.          Marland Kitchen lisinopril (PRINIVIL,ZESTRIL) 40 MG tablet   Oral   Take 40 mg by mouth every morning.          . metFORMIN (GLUCOPHAGE) 500 MG tablet   Oral   Take 1,000 mg by mouth daily.          Marland Kitchen morphine (MS CONTIN) 30 MG 12 hr tablet   Oral   Take 60 mg by mouth every 12 (twelve) hours.          . nitroGLYCERIN (NITROSTAT) 0.4 MG SL tablet   Sublingual   Place 0.4 mg under the tongue every 5 (five) minutes as needed.          . docusate sodium 100 MG CAPS   Oral   Take 100 mg by mouth 2 (two) times daily.   10 capsule   0   . oxyCODONE 10 MG TABS   Oral   Take 1-2 tablets (10-20 mg total) by mouth every 4 (four) hours.   120 tablet   0   . polyethylene glycol (MIRALAX / GLYCOLAX) packet   Oral   Take 17 g by mouth 2 (two) times daily.   14 each   0   . rosuvastatin (CRESTOR) 10 MG tablet   Oral   Take 10 mg by mouth every morning.          . saccharomyces boulardii (FLORASTOR) 250 MG capsule   Oral  Take 250 mg by mouth daily.         Marland Kitchen zolpidem (AMBIEN) 10 MG tablet   Oral   Take 10 mg by mouth at bedtime as needed for sleep.           BP 119/67  Pulse 147  Temp(Src) 98.5 F (36.9 C) (Oral)  Resp 22  SpO2 95% Physical Exam  Nursing note and vitals  reviewed. Constitutional: He is oriented to person, place, and time. He appears well-developed and well-nourished.  Anxious, upset appearing. Markedly tachycardic.   HENT:  Head: Atraumatic.  Mouth/Throat: Oropharynx is clear and moist.  Eyes: Conjunctivae are normal. No scleral icterus.  Neck: Neck supple. No tracheal deviation present.  Cardiovascular: Normal heart sounds and intact distal pulses.   tachycardic  Pulmonary/Chest: Effort normal and breath sounds normal. No accessory muscle usage. No respiratory distress.  Abdominal: Soft. He exhibits no distension and no mass. There is no tenderness. There is no rebound and no guarding.  Genitourinary:  No cva tenderness  Musculoskeletal: Normal range of motion. He exhibits no edema and no tenderness.  Healing post op hip incision right hip without sign of infection. Distal pulses palp.   Neurological: He is alert and oriented to person, place, and time.  Skin: Skin is warm and dry. He is not diaphoretic.  Psychiatric:  Anxious.     ED Course  Procedures (including critical care time)   Results for orders placed during the hospital encounter of 09/16/13  CBC      Result Value Range   WBC 6.8  4.0 - 10.5 K/uL   RBC 3.27 (*) 4.22 - 5.81 MIL/uL   Hemoglobin 9.7 (*) 13.0 - 17.0 g/dL   HCT 16.1 (*) 09.6 - 04.5 %   MCV 92.0  78.0 - 100.0 fL   MCH 29.7  26.0 - 34.0 pg   MCHC 32.2  30.0 - 36.0 g/dL   RDW 40.9  81.1 - 91.4 %   Platelets 339  150 - 400 K/uL  COMPREHENSIVE METABOLIC PANEL      Result Value Range   Sodium 134 (*) 135 - 145 mEq/L   Potassium 3.6  3.5 - 5.1 mEq/L   Chloride 97  96 - 112 mEq/L   CO2 27  19 - 32 mEq/L   Glucose, Bld 140 (*) 70 - 99 mg/dL   BUN 14  6 - 23 mg/dL   Creatinine, Ser 7.82  0.50 - 1.35 mg/dL   Calcium 8.8  8.4 - 95.6 mg/dL   Total Protein 6.8  6.0 - 8.3 g/dL   Albumin 3.1 (*) 3.5 - 5.2 g/dL   AST 22  0 - 37 U/L   ALT 14  0 - 53 U/L   Alkaline Phosphatase 112  39 - 117 U/L   Total  Bilirubin 0.4  0.3 - 1.2 mg/dL   GFR calc non Af Amer 87 (*) >90 mL/min   GFR calc Af Amer >90  >90 mL/min  PROTIME-INR      Result Value Range   Prothrombin Time 19.4 (*) 11.6 - 15.2 seconds   INR 1.69 (*) 0.00 - 1.49  TROPONIN I      Result Value Range   Troponin I <0.30  <0.30 ng/mL   Dg Chest 2 View  08/30/2013   CLINICAL DATA:  Preop right total hip  EXAM: CHEST  2 VIEW  COMPARISON:  None.  FINDINGS: The heart size and mediastinal contours are within normal limits. Both lungs  are clear. There is chronic widening of the right AC joint.  IMPRESSION: No active cardiopulmonary disease.   Electronically Signed   By: Elige Ko   On: 08/30/2013 14:07   Dg Pelvis Portable  09/03/2013   CLINICAL DATA:  Right hip prosthesis revision  EXAM: PORTABLE PELVIS 1-2 VIEWS  COMPARISON:  10/08/2010  FINDINGS: A new right hip prosthesis is in place with cerclage noted along the proximal femoral metaphysis. Two screws affix the acetabular shell component 30 degrees of lateral inclination of the acetabular shell.  Atherosclerosis is present.  There is abnormal appearing scalloping of the proximal femoral cortex in the vicinity of the cerclage wire. Appearance raises concern for possible proximal femoral metaphyseal fracture in the immediate vicinity of the cerclage wire.  IMPRESSION: 1. Subtle findings raising the possibility of proximal femoral metaphyseal fracture in the vicinity of the cerclage wire. Correlate with findings at surgery. CT might be utilized for confirmation, if clinically warranted.   Electronically Signed   By: Herbie Baltimore M.D.   On: 09/03/2013 13:46   Dg Chest Port 1 View  09/16/2013   CLINICAL DATA:  Cardiac arrhythmia  EXAM: PORTABLE CHEST - 1 VIEW  COMPARISON:  08/30/2013.  FINDINGS: The heart size and mediastinal contours are within normal limits. Mild tortuosity and calcification of the thoracic aorta is again demonstrated. Both lungs are clear. The visualized skeletal structures  are unremarkable.  IMPRESSION: No acute cardiopulmonary findings.   Electronically Signed   By: Loralie Champagne M.D.   On: 09/16/2013 18:38   Dg Hip Portable 1 View Right  09/03/2013   CLINICAL DATA:  Right hip replacement.  EXAM: PORTABLE RIGHT HIP - 1 VIEW  COMPARISON:  Right hips 09/03/2013 and 05/02/2013.  FINDINGS: Portable cross-table lateral of the right hip obtained. A right hip prosthesis appears to be well-seated and in good anatomic alignment.  IMPRESSION: Right hip prosthesis with good anatomic alignment.   Electronically Signed   By: Maisie Fus  Register   On: 09/03/2013 13:34   Dg Hip Portable 1 View Right  09/03/2013   CLINICAL DATA:  Right hip replacement.  EXAM: PORTABLE RIGHT HIP - 1 VIEW  COMPARISON:  MRI right hip 07/22/2013, right hip series 05/02/2013.  FINDINGS: Patient status post right hip replacement revision. Good anatomic alignment noted on cross-table lateral portable view. Surgical drainage noted adjacent to the hip.  IMPRESSION: Patient status post right hip replacement revision. Good anatomic alignment on one view .   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 Continuous pulse ox and monitor.  o2 [place].  Ecg.  Cxr. Labs.  Reviewed recent admission, notes - pt w afib/flutter, ecg unchanged from prior.  cardizem bolus and gtt.  Transient decrease hr to 112, but back up to 140.  cardizem gtt increased, will rebolus.  Pt also notes constant, persistent pain in hip since  surgery 2 weeks ago.  No acute or abrupt worsening of  this pain. No swelling, redness, or fevers. Incision healing well without sign of infection.  Pt requests pain med.  Dilaudid 1 mg iv.  Recheck pain sl better but persists. Additional pain med given.  Pt also very anxious, almost to point of visibly shaking.  Pt offered reassurance, comfort measures. Remains anxious.  Ativan iv.   Cardiology service called to see/admit.  CRITICAL CARE  Re marked  Tachycardia, rapid afib,  Performed by: Suzi Roots Total critical care time: 40 Critical care time was exclusive of separately billable procedures and treating other patients. Critical care was necessary to treat or prevent imminent or life-threatening deterioration. Critical care was time spent personally by me on the following activities: development of treatment plan with patient and/or surrogate as well as nursing, discussions with consultants, evaluation of patient's response to treatment, examination of patient, obtaining history from patient or surrogate, ordering and performing treatments and interventions, ordering and review of laboratory studies, ordering and review of radiographic studies, pulse oximetry and re-evaluation of patient's condition.    Discussed pt with cardiology - they will see, consult, manage afib, but request med service admit.  [facility] hospitalist called.    Suzi Roots, MD 09/16/13 [phone]

## 2013-09-17 ENCOUNTER — Encounter: Payer: Medicare Other | Admitting: Interventional Cardiology

## 2013-09-17 ENCOUNTER — Inpatient Hospital Stay (HOSPITAL_COMMUNITY): Payer: Medicare Other

## 2013-09-17 ENCOUNTER — Encounter (HOSPITAL_COMMUNITY): Payer: Self-pay | Admitting: *Deleted

## 2013-09-17 DIAGNOSIS — I4892 Unspecified atrial flutter: Secondary | ICD-10-CM

## 2013-09-17 DIAGNOSIS — E119 Type 2 diabetes mellitus without complications: Secondary | ICD-10-CM

## 2013-09-17 DIAGNOSIS — M1610 Unilateral primary osteoarthritis, unspecified hip: Secondary | ICD-10-CM

## 2013-09-17 DIAGNOSIS — I4891 Unspecified atrial fibrillation: Principal | ICD-10-CM

## 2013-09-17 DIAGNOSIS — I2510 Atherosclerotic heart disease of native coronary artery without angina pectoris: Secondary | ICD-10-CM

## 2013-09-17 DIAGNOSIS — M7989 Other specified soft tissue disorders: Secondary | ICD-10-CM

## 2013-09-17 LAB — CBC
HCT: 27.8 % — ABNORMAL LOW (ref 39.0–52.0)
Hemoglobin: 9 g/dL — ABNORMAL LOW (ref 13.0–17.0)
MCV: 91.1 fL (ref 78.0–100.0)
Platelets: 284 10*3/uL (ref 150–400)
RBC: 3.05 MIL/uL — ABNORMAL LOW (ref 4.22–5.81)
RDW: 13.8 % (ref 11.5–15.5)
WBC: 6.8 10*3/uL (ref 4.0–10.5)

## 2013-09-17 LAB — GLUCOSE, CAPILLARY
Glucose-Capillary: 161 mg/dL — ABNORMAL HIGH (ref 70–99)
Glucose-Capillary: 148 mg/dL — ABNORMAL HIGH (ref 70–99)
Glucose-Capillary: 169 mg/dL — ABNORMAL HIGH (ref 70–99)

## 2013-09-17 LAB — COMPREHENSIVE METABOLIC PANEL
ALT: 12 U/L (ref 0–53)
Albumin: 2.9 g/dL — ABNORMAL LOW (ref 3.5–5.2)
Alkaline Phosphatase: 107 U/L (ref 39–117)
BUN: 13 mg/dL (ref 6–23)
CO2: 25 mEq/L (ref 19–32)
Calcium: 8.7 mg/dL (ref 8.4–10.5)
Creatinine, Ser: 0.75 mg/dL (ref 0.50–1.35)
GFR calc Af Amer: >90 mL/min (ref >90)
GFR calc non Af Amer: >90 mL/min (ref >90)
Glucose, Bld: 159 mg/dL — ABNORMAL HIGH (ref 70–99)
Potassium: 3.9 mEq/L (ref 3.5–5.1)
Sodium: 137 mEq/L (ref 135–145)
Total Bilirubin: 0.3 mg/dL (ref 0.3–1.2)
Total Protein: 6.2 g/dL (ref 6.0–8.3)

## 2013-09-17 LAB — HEMOGLOBIN A1C
Hgb A1c MFr Bld: 6.4 % — ABNORMAL HIGH (ref <5.7)
Mean Plasma Glucose: 137 mg/dL — ABNORMAL HIGH (ref <117)

## 2013-09-17 MED ORDER — INSULIN ASPART 100 UNIT/ML ~~LOC~~ SOLN
0.0000 [IU] | SUBCUTANEOUS | Status: DC
  Administered 2013-09-17: 1 [IU] via SUBCUTANEOUS
  Administered 2013-09-17: 2 [IU] via SUBCUTANEOUS
  Administered 2013-09-17: 1 [IU] via SUBCUTANEOUS
  Administered 2013-09-18: 2 [IU] via SUBCUTANEOUS
  Administered 2013-09-18: 3 [IU] via SUBCUTANEOUS
  Administered 2013-09-18: 2 [IU] via SUBCUTANEOUS
  Administered 2013-09-18: 3 [IU] via SUBCUTANEOUS
  Administered 2013-09-18: 2 [IU] via SUBCUTANEOUS
  Administered 2013-09-19: 18:00:00 via SUBCUTANEOUS
  Administered 2013-09-19: 2 [IU] via SUBCUTANEOUS
  Administered 2013-09-19 (×3): 3 [IU] via SUBCUTANEOUS
  Administered 2013-09-19: 2 [IU] via SUBCUTANEOUS
  Administered 2013-09-20: 1 [IU] via SUBCUTANEOUS
  Administered 2013-09-20: 2 [IU] via SUBCUTANEOUS
  Administered 2013-09-20: 1 [IU] via SUBCUTANEOUS
  Administered 2013-09-20: 3 [IU] via SUBCUTANEOUS

## 2013-09-17 MED ORDER — INSULIN GLARGINE 100 UNIT/ML ~~LOC~~ SOLN
60.0000 [IU] | Freq: Every day | SUBCUTANEOUS | Status: DC
  Administered 2013-09-18: 60 [IU] via SUBCUTANEOUS
  Filled 2013-09-17 (×2): qty 0.6

## 2013-09-17 MED ORDER — INSULIN GLARGINE 100 UNIT/ML ~~LOC~~ SOLN
20.0000 [IU] | Freq: Once | SUBCUTANEOUS | Status: AC
  Administered 2013-09-17: 20 [IU] via SUBCUTANEOUS
  Filled 2013-09-17: qty 0.2

## 2013-09-17 MED ORDER — AMIODARONE HCL IN DEXTROSE 360-4.14 MG/200ML-% IV SOLN
30.0000 mg/h | INTRAVENOUS | Status: DC
  Administered 2013-09-18 – 2013-09-20 (×5): 30 mg/h via INTRAVENOUS
  Filled 2013-09-17 (×10): qty 200

## 2013-09-17 MED ORDER — AMIODARONE HCL IN DEXTROSE 360-4.14 MG/200ML-% IV SOLN
60.0000 mg/h | INTRAVENOUS | Status: AC
  Administered 2013-09-17: 60 mg/h via INTRAVENOUS
  Filled 2013-09-17 (×2): qty 200

## 2013-09-17 MED ORDER — AMIODARONE LOAD VIA INFUSION
150.0000 mg | Freq: Once | INTRAVENOUS | Status: AC
  Administered 2013-09-17: 150 mg via INTRAVENOUS
  Filled 2013-09-17: qty 83.34

## 2013-09-17 MED ORDER — KCL IN DEXTROSE-NACL 20-5-0.9 MEQ/L-%-% IV SOLN
INTRAVENOUS | Status: DC
  Administered 2013-09-17: 75 mL/h via INTRAVENOUS
  Filled 2013-09-17 (×3): qty 1000

## 2013-09-17 NOTE — Progress Notes (Signed)
VASCULAR LAB PRELIMINARY  PRELIMINARY  PRELIMINARY  PRELIMINARY  Right lower extremity venous duplex completed.    Preliminary report:  Right:  No evidence of DVT, superficial thrombosis, or Baker's cyst.  Esthela Brandner, RVT 09/17/2013, 12:52 PM

## 2013-09-17 NOTE — Progress Notes (Signed)
Utilization review completed.  

## 2013-09-17 NOTE — Progress Notes (Signed)
Nutrition Brief Note  Patient identified on the Malnutrition Screening Tool (MST) Report for weight loss PTA. Patient reports a little weight loss over the past 6 weeks due to recent surgery. Per review of usual weights below, patient has lost 3-4% of usual weight over the past 4-6 months, which is not significant. Patient reports good intake PTA. He is currently hungry, expect intake will be good once diet advanced.  Wt Readings from Last 15 Encounters:  09/17/13 182 lb 8.7 oz (82.8 kg)  09/05/13 186 lb (84.369 kg)  09/05/13 186 lb (84.369 kg)  08/30/13 186 lb (84.369 kg)  08/17/13 188 lb 9.6 oz (85.548 kg)  07/17/13 188 lb (85.276 kg)  07/03/13 197 lb (89.359 kg)  06/07/13 190 lb (86.183 kg)  04/30/13 190 lb (86.183 kg)  04/10/13 188 lb (85.276 kg)  03/14/13 188 lb (85.276 kg)  02/12/13 191 lb (86.637 kg)  01/16/13 191 lb (86.637 kg)  12/18/12 194 lb (87.998 kg)  11/21/12 194 lb (87.998 kg)    Body mass index is 25.47 kg/(m^2). Patient meets criteria for overweight based on current BMI.   Current diet order is NPO for a potential procedure today. Labs and medications reviewed.   No nutrition interventions warranted at this time. If nutrition issues arise, please consult RD.   Joaquin Courts, RD, LDN, CNSC Pager [phone] After Hours Pager [phone]

## 2013-09-17 NOTE — Progress Notes (Signed)
       Patient Name: Thomas [name] Date of Encounter: 09/17/2013    SUBJECTIVE: Still having orthopedic pain. At the rehab center, he has been noted to have a significant HR elevation day after day around 150 bpm.  TELEMETRY:  Atrial flutter with RVR  Filed Vitals:   09/17/13 0456 09/17/13 0756 09/17/13 1154 09/17/13 1600  BP: 116/56 109/51 107/66   Pulse: 103 80    Temp: 98.4 F (36.9 C) 98.2 F (36.8 C) 98 F (36.7 C) 98.4 F (36.9 C)  TempSrc: Oral Oral  Oral  Resp: 16 19    Height:      Weight: 182 lb 8.7 oz (82.8 kg)     SpO2: 97% 98%      Intake/Output Summary (Last 24 hours) at 09/17/13 1653 Last data filed at 09/17/13 1600  Gross per 24 hour  Intake    920 ml  Output   1450 ml  Net   -530 ml    LABS: Basic Metabolic Panel:  Recent Labs  57/84/69 1757 09/17/13 0435  NA 134* 137  K 3.6 3.9  CL 97 104  CO2 27 25  GLUCOSE 140* 159*  BUN 14 13  CREATININE 0.84 0.75  CALCIUM 8.8 8.7   CBC:  Recent Labs  09/16/13 1757 09/17/13 0435  WBC 6.8 6.8  HGB 9.7* 9.0*  HCT 30.1* 27.8*  MCV 92.0 91.1  PLT 339 284   Cardiac Enzymes:  Recent Labs  09/16/13 1805 09/17/13 0435  TROPONINI <0.30 <0.30  Radiology/Studies:   CXR with no acute process.  Physical Exam: Blood pressure 107/66, pulse 80, temperature 98.4 F (36.9 C), temperature source Oral, resp. rate 19, height 5\' 11"  (1.803 m), weight 182 lb 8.7 oz (82.8 kg), SpO2 98.00%. Weight change:    Clear lungs Cardiac exam is unremarkable except tachycardia. No edema is noted.  ASSESSMENT: 1. Atrial flutter with RVR. Not on adequate anticoagulation at the time of admission 2. Orthopedic pain  Plan:  1. Amiodarone load 2. Electical cardioversion with TEE guidance in 48-72 hours.  Selinda Eon 09/17/2013, 4:53 PM

## 2013-09-17 NOTE — Clinical Social Work Placement (Signed)
Clinical Social Work Department CLINICAL SOCIAL WORK PLACEMENT NOTE 09/17/2013  Patient:  Thomas [name], Thomas [name]  Account Number:  [id number] Admit date:  09/16/2013  Clinical Social Worker:  Cherre Blanc, [place]  Date/time:  09/17/2013 04:06 PM  Clinical Social Work is seeking post-discharge placement for this patient at the following level of care:   SKILLED NURSING   (*CSW will update this form in Epic as items are completed)   09/17/2013  Patient/family provided with [facility] Department of Clinical Social Work's list of facilities offering this level of care within the geographic area requested by the patient (or if unable, by the patient's family).  09/17/2013  Patient/family informed of their freedom to choose among providers that offer the needed level of care, that participate in Medicare, Medicaid or managed care program needed by the patient, have an available bed and are willing to accept the patient.  09/17/2013  Patient/family informed of [facility]' ownership interest in [facility], as well as of the fact that they are under no obligation to receive care at this facility.  PASARR submitted to EDS on  PASARR number received from EDS on   FL2 transmitted to all facilities in geographic area requested by pt/family on   FL2 transmitted to all facilities within larger geographic area on   Patient informed that his/her managed care company has contracts with or will negotiate with  certain facilities, including the following:     Patient/family informed of bed offers received:   Patient chooses bed at  Physician recommends and patient chooses bed at    Patient to be transferred to  on   Patient to be transferred to facility by   The following physician request were entered in Epic:   Additional Comments:   Roddie Mc, [place], [place], [phone]

## 2013-09-17 NOTE — Progress Notes (Signed)
[facility] PROGRESS NOTE  Thomas [name] WUJ:[id number] DOB: 1941-03-05 DOA: 09/16/2013 PCP: [name], MD  Assessment/Plan: 72 y.o. male with PMH of IDDM, HTN, CAD s/p PTCA, PAD, AAA, PAF, chronic pain, osteoarthritis with repeated hip arthroplasty is coming from SNF for a fib RVR, and worsening hip pain   1. A fib RVR; HR control improved on IV Cardizem; on xarelto; echo (08/2013): LVEF 55%, grade 3 diastolic dysfunction -defer management cardiology;   2. Arthritis; right total hip surgery 11/24-11/28; acute on chronic pain on opioids;   -xray: Right hip arthroplasty demonstrates increased lucency about the socket at the acetabulum, and also about the proximal femur -c/s orthopedics; cont pain control  3. IDDM; unclear insulin regimen, no recent HA1c; hold glipizide while NPO -cont lantus 60units + ISS; check A1C;   4. CAD s/p PTCA; cont home regimen  5. HTN BP labile; hold ACE, imdur due to low BP; cont cardizem for HR control; ;   6. Anemia chronic on iron; no s/s of acute bleeding; cont iron supplement    Code Status: full Family Communication: wife at  The bedside (indicate person spoken with, relationship, and if by phone, the number) Disposition Plan: pend evaluation    Consultants:  Ortho, cardiology  Procedures:  None   Antibiotics:  None  (indicate start date, and stop date if known)  HPI/Subjective: alert  Objective: Filed Vitals:   09/17/13 0756  BP: 109/51  Pulse: 80  Temp: 98.2 F (36.8 C)  Resp: 19    Intake/Output Summary (Last 24 hours) at 09/17/13 0908 Last data filed at 09/17/13 0600  Gross per 24 hour  Intake    920 ml  Output    675 ml  Net    245 ml   Filed Weights   09/16/13 2115 09/17/13 0456  Weight: 82 kg (180 lb 12.4 oz) 82.8 kg (182 lb 8.7 oz)    Exam:   General:  alert  Cardiovascular: s1,s2 rrr  Respiratory: CTA BL  Abdomen: soft, nt, nd   Musculoskeletal: no LE edema   Data Reviewed: Basic  Metabolic Panel:  Recent Labs Lab 09/16/13 1757 09/17/13 0435  NA 134* 137  K 3.6 3.9  CL 97 104  CO2 27 25  GLUCOSE 140* 159*  BUN 14 13  CREATININE 0.84 0.75  CALCIUM 8.8 8.7   Liver Function Tests:  Recent Labs Lab 09/16/13 1757 09/17/13 0435  AST 22 18  ALT 14 12  ALKPHOS 112 107  BILITOT 0.4 0.3  PROT 6.8 6.2  ALBUMIN 3.1* 2.9*   No results found for this basename: LIPASE, AMYLASE,  in the last 168 hours No results found for this basename: AMMONIA,  in the last 168 hours CBC:  Recent Labs Lab 09/16/13 1757 09/17/13 0435  WBC 6.8 6.8  HGB 9.7* 9.0*  HCT 30.1* 27.8*  MCV 92.0 91.1  PLT 339 284   Cardiac Enzymes:  Recent Labs Lab 09/16/13 1805 09/17/13 0435  TROPONINI <0.30 <0.30   BNP (last 3 results) No results found for this basename: PROBNP,  in the last 8760 hours CBG:  Recent Labs Lab 09/16/13 2131 09/17/13 0754  GLUCAP 145* 142*    Recent Results (from the past 240 hour(s))  MRSA PCR SCREENING     Status: None   Collection Time    09/16/13 11:29 PM      Result Value Range Status   MRSA by PCR NEGATIVE  NEGATIVE Final   Comment:  The GeneXpert MRSA Assay (FDA     approved for NASAL specimens     only), is one component of a     comprehensive MRSA colonization     surveillance program. It is not     intended to diagnose MRSA     infection nor to guide or     monitor treatment for     MRSA infections.     Studies: Dg Pelvis Portable  09/17/2013   CLINICAL DATA:  Right hip pain, status post recent right hip replacement.  EXAM: PORTABLE PELVIS 1-2 VIEWS  COMPARISON:  Pelvis radiograph performed 09/03/2013  FINDINGS: The patient's right hip arthroplasty demonstrates increased lucency about the socket at the acetabulum, and also about the proximal femur. Would recommend clinical correlation to exclude particle disease. There is no evidence of fracture. The distal aspect of the arthroplasty is incompletely imaged, and the  known cerclage wire is not seen.  The left hip joint is unremarkable in appearance. The sacroiliac joints are within normal limits. The visualized bowel gas pattern is grossly unremarkable.  IMPRESSION: 1. Right hip arthroplasty demonstrates increased lucency about the socket at the acetabulum, and also about the proximal femur. Would recommend clinical correlation to exclude particle disease. 2. No evidence of fracture; the distal aspect of the arthroplasty is incompletely imaged, and the known cerclage wire is not seen.   Electronically Signed   By: Roanna Raider M.D.   On: 09/17/2013 00:30   Dg Chest Port 1 View  09/16/2013   CLINICAL DATA:  Cardiac arrhythmia  EXAM: PORTABLE CHEST - 1 VIEW  COMPARISON:  08/30/2013.  FINDINGS: The heart size and mediastinal contours are within normal limits. Mild tortuosity and calcification of the thoracic aorta is again demonstrated. Both lungs are clear. The visualized skeletal structures are unremarkable.  IMPRESSION: No acute cardiopulmonary findings.   Electronically Signed   By: Loralie Champagne M.D.   On: 09/16/2013 18:38   Dg Hip Portable 1 View Right  09/17/2013   CLINICAL DATA:  Right hip pain, status post recent right hip replacement.  EXAM: PORTABLE RIGHT HIP - 1 VIEW  COMPARISON:  None.  FINDINGS: The cross-table lateral view is limited in evaluation for loosening or particle disease. However, there is no definite evidence of fracture. The cerclage wire is grossly unremarkable in appearance. The distal aspect of the prosthesis appears grossly intact.  IMPRESSION: No definite evidence of fracture; the cerclage wire and distal aspect of the prosthesis appear grossly intact. There was question for changes of particle disease on the concurrent pelvis radiograph; would suggest clinical correlation.   Electronically Signed   By: Roanna Raider M.D.   On: 09/17/2013 00:33    Scheduled Meds: . sodium chloride   Intravenous STAT  . aspirin EC  81 mg Oral Daily  .  atorvastatin  20 mg Oral q1800  . docusate sodium  100 mg Oral BID  . ferrous sulfate  325 mg Oral TID PC  . gabapentin  300 mg Oral TID  . glipiZIDE  10 mg Oral Daily  . insulin aspart  0-15 Units Subcutaneous TID WC  . insulin aspart  0-5 Units Subcutaneous QHS  . insulin aspart  30 Units Subcutaneous Q breakfast  . insulin glargine  60 Units Subcutaneous q morning - 10a  . isosorbide mononitrate  30 mg Oral q morning - 10a  . lisinopril  40 mg Oral q morning - 10a  . morphine  60 mg Oral Q12H  .  Rivaroxaban  20 mg Oral Q supper  . sodium chloride  3 mL Intravenous Q12H   Continuous Infusions: . sodium chloride Stopped (09/16/13 2255)  . 0.9 % NaCl with KCl 20 mEq / L 75 mL/hr at 09/16/13 2255  . diltiazem (CARDIZEM) infusion 15 mg/hr (09/17/13 0600)    Principal Problem:   Atrial fibrillation with RVR Active Problems:   S/P right TH revision   Diabetes mellitus   CAD (coronary artery disease)   Hypertension    Time spent: >35  Minutes     Esperanza Sheets  [facility] Pager [phone]. If 7PM-7AM, please contact night-coverage at [email or web address], password [facility] 09/17/2013, 9:08 AM  LOS: 1 day

## 2013-09-17 NOTE — Clinical Social Work Psychosocial (Signed)
Clinical Social Work Department BRIEF PSYCHOSOCIAL ASSESSMENT 09/17/2013  Patient:  Thomas [name], Thomas [name]     Account Number:  [id number]     Admit date:  09/16/2013  Clinical Social Worker:  Lavell Luster  Date/Time:  09/17/2013 01:50 PM  Referred by:  Physician  Date Referred:  09/17/2013 Referred for  SNF Placement   Other Referral:   Interview type:  Patient Other interview type:   Patient alert and oriented during assessment.    PSYCHOSOCIAL DATA Living Status:  WIFE Admitted from facility:  [facility] Level of care:  Skilled Nursing Facility Primary support name:  Myriam Jacobson [phone] Primary support relationship to patient:  SPOUSE Degree of support available:   Support is strong. Wife is at bedside.    CURRENT CONCERNS Current Concerns  Post-Acute Placement   Other Concerns:    SOCIAL WORK ASSESSMENT / PLAN CSW met with patient and wife at bedside to discuss DC plan. Patient was admitted to hospital from [facility] in [place]. Patient and wife would ultimately like to go home with PT and [facility] services. Wife states that if patient's pain is more manageable they would like to go home. Wife and patient would like patient to be considered for CIR. Patient is agreeable to looking at SNF options as a back up.   Assessment/plan status:  Psychosocial Support/Ongoing Assessment of Needs Other assessment/ plan:   Complete FL2, Fax   Information/referral to community resources:   CSW contact information and SNF list given to patient and wife.    PATIENT'S/FAMILY'S RESPONSE TO PLAN OF CARE: Patient and wife would like patient considered for CIR. They state that ultimately they would like for patient to go home with HHPT, but if the patient needs more intense PT they want CIR. Patient and wife are agreeable to looking at [facility] backup options. CSW will continue to follow.       Roddie Mc, [place], [place], [phone]

## 2013-09-18 ENCOUNTER — Encounter: Payer: Medicare Other | Admitting: Interventional Cardiology

## 2013-09-18 ENCOUNTER — Encounter (HOSPITAL_COMMUNITY): Payer: Self-pay | Admitting: Interventional Cardiology

## 2013-09-18 DIAGNOSIS — R0602 Shortness of breath: Secondary | ICD-10-CM | POA: Insufficient documentation

## 2013-09-18 DIAGNOSIS — I5031 Acute diastolic (congestive) heart failure: Secondary | ICD-10-CM

## 2013-09-18 DIAGNOSIS — I10 Essential (primary) hypertension: Secondary | ICD-10-CM

## 2013-09-18 LAB — CBC
HCT: 29 % — ABNORMAL LOW (ref 39.0–52.0)
Hemoglobin: 9.7 g/dL — ABNORMAL LOW (ref 13.0–17.0)
MCH: 30.5 pg (ref 26.0–34.0)
MCHC: 33.4 g/dL (ref 30.0–36.0)
MCV: 91.2 fL (ref 78.0–100.0)
RBC: 3.18 MIL/uL — ABNORMAL LOW (ref 4.22–5.81)
WBC: 8.2 10*3/uL (ref 4.0–10.5)

## 2013-09-18 LAB — GLUCOSE, CAPILLARY
Glucose-Capillary: 155 mg/dL — ABNORMAL HIGH (ref 70–99)
Glucose-Capillary: 172 mg/dL — ABNORMAL HIGH (ref 70–99)
Glucose-Capillary: 212 mg/dL — ABNORMAL HIGH (ref 70–99)
Glucose-Capillary: 229 mg/dL — ABNORMAL HIGH (ref 70–99)

## 2013-09-18 LAB — TSH: TSH: 0.486 u[IU]/mL (ref 0.350–4.500)

## 2013-09-18 MED ORDER — FUROSEMIDE 10 MG/ML IJ SOLN
40.0000 mg | Freq: Once | INTRAMUSCULAR | Status: AC
  Administered 2013-09-18: 40 mg via INTRAVENOUS
  Filled 2013-09-18: qty 4

## 2013-09-18 MED ORDER — HYDROMORPHONE HCL PF 1 MG/ML IJ SOLN
1.0000 mg | INTRAMUSCULAR | Status: DC | PRN
  Administered 2013-09-19 – 2013-09-20 (×7): 1 mg via INTRAVENOUS
  Filled 2013-09-18 (×9): qty 1

## 2013-09-18 MED ORDER — FUROSEMIDE 10 MG/ML IJ SOLN: 40.0000 mg | Freq: Two times a day (BID) | INTRAMUSCULAR | Status: DC

## 2013-09-18 MED ORDER — LEVALBUTEROL HCL 0.63 MG/3ML IN NEBU: 0.6300 mg | INHALATION_SOLUTION | Freq: Four times a day (QID) | RESPIRATORY_TRACT | Status: DC | PRN

## 2013-09-18 MED ORDER — FUROSEMIDE 10 MG/ML IJ SOLN
40.0000 mg | Freq: Two times a day (BID) | INTRAMUSCULAR | Status: DC
  Administered 2013-09-18 – 2013-09-20 (×4): 40 mg via INTRAVENOUS
  Filled 2013-09-18 (×5): qty 4

## 2013-09-18 MED ORDER — HYDROMORPHONE HCL PF 1 MG/ML IJ SOLN
INTRAMUSCULAR | Status: AC
  Administered 2013-09-18: 1 mg
  Filled 2013-09-18: qty 1

## 2013-09-18 NOTE — Clinical Social Work Placement (Signed)
Clinical Social Work Department CLINICAL SOCIAL WORK PLACEMENT NOTE 09/18/2013  Patient:  KAHIAU, SCHEWE  Account Number:  [id number] Admit date:  09/16/2013  Clinical Social Worker:  Cherre Blanc, [place]  Date/time:  09/17/2013 04:06 PM  Clinical Social Work is seeking post-discharge placement for this patient at the following level of care:   SKILLED NURSING   (*CSW will update this form in Epic as items are completed)   09/17/2013  Patient/family provided with [facility] Department of Clinical Social Work's list of facilities offering this level of care within the geographic area requested by the patient (or if unable, by the patient's family).  09/17/2013  Patient/family informed of their freedom to choose among providers that offer the needed level of care, that participate in Medicare, Medicaid or managed care program needed by the patient, have an available bed and are willing to accept the patient.  09/17/2013  Patient/family informed of [facility]' ownership interest in [facility], as well as of the fact that they are under no obligation to receive care at this facility.  PASARR submitted to EDS on  PASARR number received from EDS on   FL2 transmitted to all facilities in geographic area requested by pt/family on  09/17/2013 FL2 transmitted to all facilities within larger geographic area on   Patient informed that his/her managed care company has contracts with or will negotiate with  certain facilities, including the following:     Patient/family informed of bed offers received:  09/18/2013 Patient chooses bed at  Physician recommends and patient chooses bed at    Patient to be transferred to  on   Patient to be transferred to facility by   The following physician request were entered in Epic:   Additional Comments:   Roddie Mc, [place], [phone]

## 2013-09-18 NOTE — Evaluation (Signed)
Physical Therapy Evaluation Patient Details Name: Thomas [name] MRN: [id number] DOB: August 16, 1941 Today's Date: 09/18/2013 Time: 4098-1191 PT Time Calculation (min): 27 min  PT Assessment / Plan / Recommendation History of Present Illness  Pt admitted for elevated HR. Pt at SNF for rehab for R Total hip revision on 11/24.  Clinical Impression  Pt OOB mobility and ambulation tolerance limited by increased R hip pain with all mobility. Pt con't to require assist for safety for all mobility. Pt con't to benefit from SNF upon d/c to achieve safe mod I function for safe transition home.    PT Assessment  Patient needs continued PT services    Follow Up Recommendations  SNF    Does the patient have the potential to tolerate intense rehabilitation      Barriers to Discharge        Equipment Recommendations  None recommended by PT    Recommendations for Other Services     Frequency Min 4X/week    Precautions / Restrictions Precautions Precautions: Posterior Hip Precaution Comments: pt re-educated. per Dr. Charlann Boxer no active R abduction Restrictions RLE Weight Bearing: Partial weight bearing RLE Partial Weight Bearing Percentage or Pounds: 50%   Pertinent Vitals/Pain 7/10 R hip pain      Mobility  Bed Mobility Bed Mobility: Supine to Sit Supine to Sit: 4: Min assist;HOB flat Details for Bed Mobility Assistance: assist for R LE mangement to adhere to precautions Transfers Transfers: Sit to Stand;Stand to Sit Sit to Stand: 4: Min assist;From elevated surface;From bed Stand to Sit: 4: Min assist;With upper extremity assist;To chair/3-in-1 Details for Transfer Assistance: v/c's for safe hand placement Ambulation/Gait Ambulation/Gait Assistance: 4: Min assist Ambulation Distance (Feet): 100 Feet Assistive device: Rolling walker Ambulation/Gait Assistance Details: increased bilat UE WBing, v/c's to decrease step length,. pt required 1 standign rest break due to increased R hip  pain Gait Pattern: Step-to pattern;Antalgic;Trunk flexed;Narrow base of support Gait velocity: slow    Exercises     PT Diagnosis: Difficulty walking  PT Problem List: Decreased strength;Decreased range of motion;Decreased activity tolerance;Decreased balance;Decreased mobility;Pain;Decreased cognition;Decreased knowledge of use of DME PT Treatment Interventions: DME instruction;Gait training;Functional mobility training;Therapeutic activities;Therapeutic exercise;Patient/family education     PT Goals(Current goals can be found in the care plan section) Acute Rehab PT Goals Patient Stated Goal: to talk to dr. Charlann Boxer regarding his hip surgery. PT Goal Formulation: With patient Time For Goal Achievement: 10/02/13 Potential to Achieve Goals: Good  Visit Information  Last PT Received On: 09/18/13 Assistance Needed: +1 History of Present Illness: Pt admitted for elevated HR. Pt at SNF for rehab for R Total hip revision on 11/24.       Prior Functioning  Home Living Family/patient expects to be discharged to:: Skilled nursing facility Living Arrangements: Spouse/significant other Additional Comments: spouse avail 24/7 Prior Function Level of Independence: Needs assistance Comments: was undergoing rehab Communication Communication: No difficulties Dominant Hand: Right    Cognition  Cognition Arousal/Alertness: Awake/alert Behavior During Therapy: Anxious (re: talking to dr. Charlann Boxer) Overall Cognitive Status: Within Functional Limits for tasks assessed    Extremity/Trunk Assessment Upper Extremity Assessment Upper Extremity Assessment: Overall WFL for tasks assessed Lower Extremity Assessment RLE Deficits / Details: limited by precautions. able to complete LAQ Cervical / Trunk Assessment Cervical / Trunk Assessment: Normal   Balance    End of Session PT - End of Session Equipment Utilized During Treatment: Gait belt Activity Tolerance: Patient limited by pain Patient left: in  chair;with call bell/phone within reach;with  family/visitor present Nurse Communication: Mobility status  GP     Marcene Brawn 09/18/2013, 4:52 PM  Lewis Shock, PT, DPT Pager #: [phone] Office #: [phone]

## 2013-09-18 NOTE — Progress Notes (Addendum)
Verified diet order with Armanda Magic; Cardiac diet until midnight then NPO; cardioversion planned for tomorrow.  Will continue to monitor.  Vivi Martens RN

## 2013-09-18 NOTE — Progress Notes (Signed)
Patient ID: Thomas [name], male   DOB: 1941/08/28, 72 y.o.   MRN: [id number]  Admission X-rays reviewed and same as immediate post-operative, no new findings.  Large greater troch segment with fracture at base.  Will adjust PT orders to try and help with pain  Continue pain management  No operation required or expected

## 2013-09-18 NOTE — Progress Notes (Signed)
[facility] PROGRESS NOTE  Thomas [name] ZOX:[id number] DOB: 1940-12-19 DOA: 09/16/2013 PCP: [name], MD  Brief history  72 y.o. male with PMH of IDDM, HTN, CAD s/p PTCA, PAD, AAA, PAF, chronic pain, osteoarthritis with repeated hip arthroplasty recently discharged to rehab post op coming from SNF for a fib RVR, worsening hip pain and found to have hip fracture and CHF    Assessment/Plan: 1. A fib RVR; on IV Cardizem+amiodarone per cardiology;  -defer management cardiology; possible TEE cardioversion 48-72 hours; f/u TSH  2. Acute on chronic CHF likely worse with A fib RVR; diastolic HF; echo (08/2013): LVEF 55%, grade 3 diastolic dysfunction -IV lasix 40 x 1 today; diuresis as needed; control HR; daily I/O;  3. S/p right total hip surgery 11/24-11/28; acute on chronic pain on opioids;   -xray: Periprosthetic fracture in the region of the cerclage wire; d/w Dr. Charlann Boxer awaiting evaluation; cont pain control  4. IDDM; unclear insulin regimen, HA1c-6.4; hold glipizide while NPO -cont lantus 60units + ISS;  5. CAD s/p PTCA; trop neg; ECG a fib; cont home regimen  6. HTN BP labile; hold ACE, imdur due to low BP; cont cardizem for HR control; ;   7. Anemia chronic on iron; no s/s of acute bleeding; cont iron supplement    Code Status: full Family Communication: wife at  The bedside (indicate person spoken with, relationship, and if by phone, the number) Disposition Plan: pend evaluation of hip, PT/OT   Consultants:  Ortho, cardiology  Procedures:  None   Antibiotics:  None  (indicate start date, and stop date if known)  HPI/Subjective: alert  Objective: Filed Vitals:   09/18/13 0600  BP: 154/52  Pulse:   Temp:   Resp: 29    Intake/Output Summary (Last 24 hours) at 09/18/13 0809 Last data filed at 09/18/13 0700  Gross per 24 hour  Intake 2507.29 ml  Output   2000 ml  Net 507.29 ml   Filed Weights   09/16/13 2115 09/17/13 0456 09/18/13 0417  Weight:  82 kg (180 lb 12.4 oz) 82.8 kg (182 lb 8.7 oz) 83.2 kg (183 lb 6.8 oz)    Exam:   General:  alert  Cardiovascular: s1,s2 rrr  Respiratory: CTA BL  Abdomen: soft, nt, nd   Musculoskeletal: no LE edema   Data Reviewed: Basic Metabolic Panel:  Recent Labs Lab 09/16/13 1757 09/17/13 0435  NA 134* 137  K 3.6 3.9  CL 97 104  CO2 27 25  GLUCOSE 140* 159*  BUN 14 13  CREATININE 0.84 0.75  CALCIUM 8.8 8.7   Liver Function Tests:  Recent Labs Lab 09/16/13 1757 09/17/13 0435  AST 22 18  ALT 14 12  ALKPHOS 112 107  BILITOT 0.4 0.3  PROT 6.8 6.2  ALBUMIN 3.1* 2.9*   No results found for this basename: LIPASE, AMYLASE,  in the last 168 hours No results found for this basename: AMMONIA,  in the last 168 hours CBC:  Recent Labs Lab 09/16/13 1757 09/17/13 0435 09/18/13 0435  WBC 6.8 6.8 8.2  HGB 9.7* 9.0* 9.7*  HCT 30.1* 27.8* 29.0*  MCV 92.0 91.1 91.2  PLT 339 284 298   Cardiac Enzymes:  Recent Labs Lab 09/16/13 1805 09/17/13 0435  TROPONINI <0.30 <0.30   BNP (last 3 results)  Recent Labs  09/17/13 1900  PROBNP 1110.0*   CBG:  Recent Labs Lab 09/17/13 1123 09/17/13 1615 09/17/13 2137 09/17/13 2354 09/18/13 0407  GLUCAP 148* 140* 169* 155* 172*  Recent Results (from the past 240 hour(s))  MRSA PCR SCREENING     Status: None   Collection Time    09/16/13 11:29 PM      Result Value Range Status   MRSA by PCR NEGATIVE  NEGATIVE Final   Comment:            The GeneXpert MRSA Assay (FDA     approved for NASAL specimens     only), is one component of a     comprehensive MRSA colonization     surveillance program. It is not     intended to diagnose MRSA     infection nor to guide or     monitor treatment for     MRSA infections.     Studies: Dg Pelvis Portable  09/17/2013   CLINICAL DATA:  Right hip pain, status post recent right hip replacement.  EXAM: PORTABLE PELVIS 1-2 VIEWS  COMPARISON:  Pelvis radiograph performed 09/03/2013   FINDINGS: The patient's right hip arthroplasty demonstrates increased lucency about the socket at the acetabulum, and also about the proximal femur. Would recommend clinical correlation to exclude particle disease. There is no evidence of fracture. The distal aspect of the arthroplasty is incompletely imaged, and the known cerclage wire is not seen.  The left hip joint is unremarkable in appearance. The sacroiliac joints are within normal limits. The visualized bowel gas pattern is grossly unremarkable.  IMPRESSION: 1. Right hip arthroplasty demonstrates increased lucency about the socket at the acetabulum, and also about the proximal femur. Would recommend clinical correlation to exclude particle disease. 2. No evidence of fracture; the distal aspect of the arthroplasty is incompletely imaged, and the known cerclage wire is not seen.   Electronically Signed   By: Roanna Raider M.D.   On: 09/17/2013 00:30   Dg Chest Port 1 View  09/16/2013   CLINICAL DATA:  Cardiac arrhythmia  EXAM: PORTABLE CHEST - 1 VIEW  COMPARISON:  08/30/2013.  FINDINGS: The heart size and mediastinal contours are within normal limits. Mild tortuosity and calcification of the thoracic aorta is again demonstrated. Both lungs are clear. The visualized skeletal structures are unremarkable.  IMPRESSION: No acute cardiopulmonary findings.   Electronically Signed   By: Loralie Champagne M.D.   On: 09/16/2013 18:38   Dg Hip Portable 1 View Right  09/17/2013   CLINICAL DATA:  Pain, evaluate for fracture  EXAM: PORTABLE RIGHT HIP - 1 VIEW  COMPARISON:  Prior radiographs of the right hip 05/02/2013  FINDINGS: Periprosthetic fracture of the lateral aspect of the humerus in the region of the cerclage wire. The fracture fragment including the greater trochanter is displaced slightly laterally resulting in approximately 11 mm of gap between the femoral component and greater trochanter. The femoral head component appears located within the acetabular  component. The visualized bony pelvis is intact. Atherosclerotic calcifications noted throughout the superficial femoral artery.  IMPRESSION: Periprosthetic fracture in the region of the cerclage wire. The fracture fragment including the greater trochanter is displaced laterally by approximately 11 mm at the superior aspect of the femoral neck prosthesis.   Electronically Signed   By: Malachy Moan M.D.   On: 09/17/2013 15:50   Dg Hip Portable 1 View Right  09/17/2013   CLINICAL DATA:  Right hip pain, status post recent right hip replacement.  EXAM: PORTABLE RIGHT HIP - 1 VIEW  COMPARISON:  None.  FINDINGS: The cross-table lateral view is limited in evaluation for loosening or particle disease. However, there  is no definite evidence of fracture. The cerclage wire is grossly unremarkable in appearance. The distal aspect of the prosthesis appears grossly intact.  IMPRESSION: No definite evidence of fracture; the cerclage wire and distal aspect of the prosthesis appear grossly intact. There was question for changes of particle disease on the concurrent pelvis radiograph; would suggest clinical correlation.   Electronically Signed   By: Roanna Raider M.D.   On: 09/17/2013 00:33    Scheduled Meds: . aspirin EC  81 mg Oral Daily  . atorvastatin  20 mg Oral q1800  . docusate sodium  100 mg Oral BID  . ferrous sulfate  325 mg Oral TID PC  . furosemide  40 mg Intravenous Once  . gabapentin  300 mg Oral TID  . insulin aspart  0-9 Units Subcutaneous Q4H  . insulin glargine  60 Units Subcutaneous Daily  . morphine  60 mg Oral Q12H  . Rivaroxaban  20 mg Oral Q supper  . sodium chloride  3 mL Intravenous Q12H   Continuous Infusions: . sodium chloride Stopped (09/16/13 2255)  . amiodarone (NEXTERONE PREMIX) 360 mg/200 mL dextrose 30 mg/hr (09/18/13 0600)  . diltiazem (CARDIZEM) infusion 15 mg/hr (09/18/13 0600)    Principal Problem:   Atrial fibrillation with RVR Active Problems:   S/P right TH  revision   Diabetes mellitus   CAD (coronary artery disease)   Hypertension    Time spent: >35  Minutes     Esperanza Sheets  [facility] Pager [phone]. If 7PM-7AM, please contact night-coverage at [email or web address], password [facility] 09/18/2013, 8:09 AM  LOS: 2 days

## 2013-09-18 NOTE — Progress Notes (Signed)
VASCULAR LAB PRELIMINARY  PRELIMINARY  PRELIMINARY  PRELIMINARY  Left lower extremity venous duplex completed.    Preliminary report:  Left:  No evidence of DVT, superficial thrombosis, or Baker's cyst.   Negative right LE venous study completed yesterday.    Marijayne Rauth, RVT 09/18/2013, 12:41 PM

## 2013-09-18 NOTE — Progress Notes (Signed)
       Patient Name: Thomas [name] Date of Encounter: 09/18/2013    SUBJECTIVE: Still having orthopedic pain. At the rehab center, he has been noted to have a significant HR elevation day after day around 150 bpm.  HR better on IV cardizem.  SHOB last night started suddenly.  Worse this AM despite ok o2 sats.  TELEMETRY:  Atrial flutter with RVR  Filed Vitals:   09/18/13 0417 09/18/13 0500 09/18/13 0600 09/18/13 0800  BP:  132/54 154/52 150/66  Pulse:    104  Temp:    100.3 F (37.9 C)  TempSrc:    Oral  Resp:  22 29 18   Height:      Weight: 183 lb 6.8 oz (83.2 kg)     SpO2:    92%    Intake/Output Summary (Last 24 hours) at 09/18/13 0923 Last data filed at 09/18/13 0700  Gross per 24 hour  Intake 2507.29 ml  Output   1825 ml  Net 682.29 ml    LABS: Basic Metabolic Panel:  Recent Labs  40/98/11 1757 09/17/13 0435  NA 134* 137  K 3.6 3.9  CL 97 104  CO2 27 25  GLUCOSE 140* 159*  BUN 14 13  CREATININE 0.84 0.75  CALCIUM 8.8 8.7   CBC:  Recent Labs  09/17/13 0435 09/18/13 0435  WBC 6.8 8.2  HGB 9.0* 9.7*  HCT 27.8* 29.0*  MCV 91.1 91.2  PLT 284 298   Cardiac Enzymes:  Recent Labs  09/16/13 1805 09/17/13 0435  TROPONINI <0.30 <0.30  Radiology/Studies:   CXR with no acute process.  Physical Exam: Blood pressure 150/66, pulse 104, temperature 100.3 F (37.9 C), temperature source Oral, resp. rate 18, height 5\' 11"  (1.803 m), weight 183 lb 6.8 oz (83.2 kg), SpO2 92.00%. Weight change: 2 lb 10.3 oz (1.2 kg)   [place]/AT No JVD Bibasilar crackles Cardiac exam is unremarkable except tachycardia. No edema is noted.  ASSESSMENT: 1. Atrial flutter with RVR. Not on adequate anticoagulation at the time of admission. Xarelto started. 2. Orthopedic pain. ? increase Dilaudid.  He has needed high doses of pain meds in the past, even for cath.  Plan:  1. Amiodarone load 2. Electical cardioversion with TEE guidance scheduled for tomorrow.  Hopefully he  will convert.   Lasix for diastolic heart failure related to AFlutter.  Check LE venous DOpplers to make sure there is no DVT, given acute onset of SHOB.  He is on Xarelto, but it is not the PE dose.   Signed, Lance Muss S. 09/18/2013, 9:23 AM

## 2013-09-18 NOTE — Progress Notes (Signed)
OT Cancellation Note  Patient Details Name: Thomas [name] MRN: [id number] DOB: [birth year]   Cancelled Treatment:    Reason Eval/Treat Not Completed: Medical issues which prohibited therapy Pt with cardiac issues and apparent hip fracture. Awaiting ortho consult. Will sign off. Please reorder when appropriate.  [facility] [name], OTR/L  [phone] 09/18/2013 09/18/2013, 8:40 AM

## 2013-09-18 NOTE — Progress Notes (Signed)
PT Cancellation Note  Patient Details Name: Thomas [name] MRN: [id number] DOB: [birth year]   Cancelled Treatment:    Reason Eval/Treat Not Completed: Patient not medically ready. Pt with R periprosthetic hip fracture. Awaiting medical/surgical plan. Please re-consult when pt appropriate for PT.   Marcene Brawn 09/18/2013, 8:04 AM

## 2013-09-19 ENCOUNTER — Other Ambulatory Visit: Payer: Self-pay

## 2013-09-19 ENCOUNTER — Encounter (HOSPITAL_COMMUNITY): Payer: Self-pay | Admitting: Anesthesiology

## 2013-09-19 ENCOUNTER — Encounter (HOSPITAL_COMMUNITY)
Admission: EM | Disposition: A | Discharge status: Home Health | Payer: Self-pay | Source: Home / Self Care | Attending: Internal Medicine

## 2013-09-19 DIAGNOSIS — E876 Hypokalemia: Secondary | ICD-10-CM | POA: Diagnosis not present

## 2013-09-19 DIAGNOSIS — Z79899 Other long term (current) drug therapy: Secondary | ICD-10-CM

## 2013-09-19 DIAGNOSIS — Z79891 Long term (current) use of opiate analgesic: Secondary | ICD-10-CM

## 2013-09-19 LAB — CBC
HCT: 28.2 % — ABNORMAL LOW (ref 39.0–52.0)
Hemoglobin: 9.1 g/dL — ABNORMAL LOW (ref 13.0–17.0)
MCH: 29.3 pg (ref 26.0–34.0)
MCV: 90.7 fL (ref 78.0–100.0)
RBC: 3.11 MIL/uL — ABNORMAL LOW (ref 4.22–5.81)
WBC: 4.1 10*3/uL (ref 4.0–10.5)

## 2013-09-19 LAB — GLUCOSE, CAPILLARY
Glucose-Capillary: 219 mg/dL — ABNORMAL HIGH (ref 70–99)
Glucose-Capillary: 160 mg/dL — ABNORMAL HIGH (ref 70–99)
Glucose-Capillary: 193 mg/dL — ABNORMAL HIGH (ref 70–99)

## 2013-09-19 LAB — BASIC METABOLIC PANEL
CO2: 26 mEq/L (ref 19–32)
Calcium: 8.4 mg/dL (ref 8.4–10.5)
Chloride: 100 mEq/L (ref 96–112)
Glucose, Bld: 157 mg/dL — ABNORMAL HIGH (ref 70–99)
Potassium: 3 mEq/L — ABNORMAL LOW (ref 3.5–5.1)
Sodium: 136 mEq/L (ref 135–145)

## 2013-09-19 SURGERY — ECHOCARDIOGRAM, TRANSESOPHAGEAL
Anesthesia: Moderate Sedation

## 2013-09-19 SURGERY — CARDIOVERSION
Anesthesia: Monitor Anesthesia Care

## 2013-09-19 MED ORDER — INSULIN GLARGINE 100 UNIT/ML ~~LOC~~ SOLN
30.0000 [IU] | Freq: Every day | SUBCUTANEOUS | Status: DC
  Administered 2013-09-19: 30 [IU] via SUBCUTANEOUS
  Filled 2013-09-19 (×2): qty 0.3

## 2013-09-19 MED ORDER — POTASSIUM CHLORIDE CRYS ER 20 MEQ PO TBCR
40.0000 meq | EXTENDED_RELEASE_TABLET | Freq: Three times a day (TID) | ORAL | Status: DC
  Administered 2013-09-19 – 2013-09-21 (×7): 40 meq via ORAL
  Filled 2013-09-19 (×9): qty 2

## 2013-09-19 MED ORDER — POTASSIUM CHLORIDE CRYS ER 20 MEQ PO TBCR: 40.0000 meq | EXTENDED_RELEASE_TABLET | Freq: Three times a day (TID) | ORAL | Status: DC

## 2013-09-19 MED ORDER — POTASSIUM CHLORIDE 10 MEQ/100ML IV SOLN
10.0000 meq | INTRAVENOUS | Status: AC
  Administered 2013-09-19 (×4): 10 meq via INTRAVENOUS
  Filled 2013-09-19 (×4): qty 100

## 2013-09-19 NOTE — Consult Note (Signed)
PHARMACY CONSULT NOTE - FOLLOW UP  Pharmacy Consult:  Xarelto Indication: Non-valvular atrial fibrillation w/RVR   Allergies: Allergies  Allergen Reactions  . Nsaids Diarrhea    Height/Weight: Height: 5\' 11"  (180.3 cm) Weight: 183 lb 6.8 oz (83.2 kg) IBW/kg (Calculated) : 75.3   Vital Signs: BP 121/47  Pulse 99  Temp(Src) 98.9 F (37.2 C) (Oral)  Resp 26  Ht 5\' 11"  (1.803 m)  Wt 183 lb 6.8 oz (83.2 kg)  BMI 25.59 kg/m2  SpO2 95%  Active Problems: Active Problems:    Atrial fibrillation with RVR   Labs:  Recent Labs  09/16/13 1757 09/17/13 0435 09/18/13 0435 09/19/13 0405  HGB 9.7* 9.0* 9.7* 9.1*  HCT 30.1* 27.8* 29.0* 28.2*  PLT 339 284 298 235  LABPROT 19.4*  --   --   --   INR 1.69*  --   --   --   CREATININE 0.84 0.75  --  0.72   Estimated Creatinine Clearance: 91.5 ml/min (by C-G formula based on Cr of 0.72).   Assessment:  72 y.o.male on Xarelto for recent hip surgery and Afib with RVR. Currently on amiodarone and diltiazem per cardiology. Was converted to NSR briefly but now back in A fib. Awaiting TEE and cardioversion today.  Renal function with CrCl 92 ml/min.  Hgb 9.1,  Plt 235.  No bleeding complications noted   Goal of Therapy:   Full anticoagulation doses of Xarelto for atrial fibrillation       Plan:  1) Xarelto dose to 20 mg daily.  2) Monitor for s/s of bleeding 3) F/u cbc   Vinnie Level, PharmD.  Clinical Pharmacist Pager [phone]

## 2013-09-19 NOTE — Progress Notes (Signed)
Spoke with MD Eldridge Dace regarding patient having some chest tightness and SOB; EKG was ordered and done.  Will continue to monitor.  Vivi Martens RN

## 2013-09-19 NOTE — Progress Notes (Signed)
Chart reviewed.  [facility] PROGRESS NOTE  Thomas [name] ZOX:[id number] DOB: 1941/03/18 DOA: 09/16/2013 PCP: [name], MD  Brief history  72 y.o. male with PMH of IDDM, HTN, CAD s/p PTCA, PAD, AAA, PAF, chronic pain, osteoarthritis with repeated hip arthroplasty recently discharged to rehab post op coming from SNF for a fib RVR, worsening hip pain and found to have hip fracture and CHF    Assessment/Plan: 1. A fib RVR; on IV Cardizem+amiodarone per cardiology; converted to NSR briefly. Now back in A fib. Rate around 100.  NPO for TEE and Cardioversion today. On Xarelto  Hypokalemia: replete IV while NPO. Check magnesium  2. Acute on chronic CHF likely worse with A fib RVR; diastolic HF; echo (08/2013): LVEF 55%, grade 3 diastolic dysfunction Got lasix 12/9; diuresis as needed; control HR; daily I/O, weights  3. S/p right total hip surgery 11/24-11/28; acute on chronic pain on opioids;   -xray: Periprosthetic fracture in the region of the cerclage wire; Dr. York Spaniel d/w Dr. Charlann Boxer, who reviewed xray and wrote a note, but has not yet evaluated patient; Per patient, will see today?  Dilaudid changed to q2h PRN.  Opiate tolerant, on chronic opiates.  4. IDDM; unclear insulin regimen, HA1c-6.4; hold glipizide while NPO -cont lantus 60units + ISS;  5. CAD s/p PTCA; trop neg; ECG a fib; cont home regimen  6. HTN BP labile;  ACE, imdur held due to low BP; cont cardizem for HR control; ;   7. Anemia chronic on iron; no s/s of acute bleeding; cont iron supplement   Multiple questions answered, discussed plan, labs, current clinical issues at great length. . Code Status: full Family Communication: wife at  The bedside  Disposition Plan: PT recommends SNF.  I agree   Consultants:  Ortho, cardiology  Procedures:  None   Antibiotics:  None  (indicate start date, and stop date if known)  Subjective: Upset about multiple things. "I need all the information."  Wants to see Dr.  Charlann Boxer, the cardiologist "not the house doctor". Hungry.  Pain 6/10, but better since dilaudid changed.  Some dyspnea.  Objective: Filed Vitals:   09/19/13 0010 09/19/13 0400 09/19/13 0600 09/19/13 0800  BP: 127/42 105/83 121/47   Pulse: 78 69 75 99  Temp:  99.3 F (37.4 C)  98.9 F (37.2 C)  TempSrc:  Oral  Oral  Resp: 16 26 22 26   Height:      Weight:      SpO2: 95% 93% 98% 95%    Intake/Output Summary (Last 24 hours) at 09/19/13 0857 Last data filed at 09/19/13 0600  Gross per 24 hour  Intake 1085.72 ml  Output   1825 ml  Net -739.28 ml   Filed Weights   09/16/13 2115 09/17/13 0456 09/18/13 0417  Weight: 82 kg (180 lb 12.4 oz) 82.8 kg (182 lb 8.7 oz) 83.2 kg (183 lb 6.8 oz)   tele: atrial fib with rate around 100  Exam:   General:  Alert in chair, crotchety  Cardiovascular: irregularly irregular  Respiratory: CTA BL  Abdomen: soft, nt, nd   Musculoskeletal: no LE edema   Data Reviewed: Basic Metabolic Panel:  Recent Labs Lab 09/16/13 1757 09/17/13 0435 09/19/13 0405  NA 134* 137 136  K 3.6 3.9 3.0*  CL 97 104 100  CO2 27 25 26   GLUCOSE 140* 159* 157*  BUN 14 13 11   CREATININE 0.84 0.75 0.72  CALCIUM 8.8 8.7 8.4   Liver Function Tests:  Recent Labs Lab  09/16/13 1757 09/17/13 0435  AST 22 18  ALT 14 12  ALKPHOS 112 107  BILITOT 0.4 0.3  PROT 6.8 6.2  ALBUMIN 3.1* 2.9*   No results found for this basename: LIPASE, AMYLASE,  in the last 168 hours No results found for this basename: AMMONIA,  in the last 168 hours CBC:  Recent Labs Lab 09/16/13 1757 09/17/13 0435 09/18/13 0435 09/19/13 0405  WBC 6.8 6.8 8.2 4.1  HGB 9.7* 9.0* 9.7* 9.1*  HCT 30.1* 27.8* 29.0* 28.2*  MCV 92.0 91.1 91.2 90.7  PLT 339 284 298 235   Cardiac Enzymes:  Recent Labs Lab 09/16/13 1805 09/17/13 0435  TROPONINI <0.30 <0.30   BNP (last 3 results)  Recent Labs  09/17/13 1900  PROBNP 1110.0*   CBG:  Recent Labs Lab 09/18/13 1234 09/18/13 1734  09/18/13 2005 09/18/13 2323 09/19/13 0410  GLUCAP 212* 210* 155* 219* 160*    Recent Results (from the past 240 hour(s))  MRSA PCR SCREENING     Status: None   Collection Time    09/16/13 11:29 PM      Result Value Range Status   MRSA by PCR NEGATIVE  NEGATIVE Final   Comment:            The GeneXpert MRSA Assay (FDA     approved for NASAL specimens     only), is one component of a     comprehensive MRSA colonization     surveillance program. It is not     intended to diagnose MRSA     infection nor to guide or     monitor treatment for     MRSA infections.     Studies: Dg Hip Portable 1 View Right  09/17/2013   CLINICAL DATA:  Pain, evaluate for fracture  EXAM: PORTABLE RIGHT HIP - 1 VIEW  COMPARISON:  Prior radiographs of the right hip 05/02/2013  FINDINGS: Periprosthetic fracture of the lateral aspect of the humerus in the region of the cerclage wire. The fracture fragment including the greater trochanter is displaced slightly laterally resulting in approximately 11 mm of gap between the femoral component and greater trochanter. The femoral head component appears located within the acetabular component. The visualized bony pelvis is intact. Atherosclerotic calcifications noted throughout the superficial femoral artery.  IMPRESSION: Periprosthetic fracture in the region of the cerclage wire. The fracture fragment including the greater trochanter is displaced laterally by approximately 11 mm at the superior aspect of the femoral neck prosthesis.   Electronically Signed   By: Malachy Moan M.D.   On: 09/17/2013 15:50   Doppler legs: no DVT  Scheduled Meds: . aspirin EC  81 mg Oral Daily  . atorvastatin  20 mg Oral q1800  . docusate sodium  100 mg Oral BID  . ferrous sulfate  325 mg Oral TID PC  . furosemide  40 mg Intravenous Q12H  . gabapentin  300 mg Oral TID  . insulin aspart  0-9 Units Subcutaneous Q4H  . insulin glargine  60 Units Subcutaneous Daily  . morphine  60 mg  Oral Q12H  . Rivaroxaban  20 mg Oral Q supper  . sodium chloride  3 mL Intravenous Q12H   Continuous Infusions: . sodium chloride Stopped (09/16/13 2255)  . amiodarone (NEXTERONE PREMIX) 360 mg/200 mL dextrose 30 mg/hr (09/19/13 0600)  . diltiazem (CARDIZEM) infusion 20 mg/hr (09/19/13 0736)   Time spent: >35  Minutes   Jaymarie Yeakel L  [facility] Pager [phone]. If 7PM-7AM, please  contact night-coverage at [email or web address], password [facility] 09/19/2013, 8:57 AM  LOS: 3 days

## 2013-09-19 NOTE — Progress Notes (Signed)
Pt converted from NS c BBB to A.fib 80-116 @ approximately 0520. [facility] notified and instructed RN to page cardiology. Cardiology, Yousof, notified. BP 121/47. No new orders given. Will continue to monitor.

## 2013-09-19 NOTE — Progress Notes (Addendum)
       Patient Name: Thomas [name] Date of Encounter: 09/19/2013    SUBJECTIVE: Still having orthopedic pain. At the rehab center, he has been noted to have a significant HR elevation day after day around 150 bpm.  HR better on IV cardizem, Amio.  SHOB 2 nights ago started suddenly.  Better immediately after lasix but then gets worse.  TELEMETRY:  Atrial flutter with RVR; occasional controlled HR, particularly during sleep  Filed Vitals:   09/19/13 0010 09/19/13 0400 09/19/13 0600 09/19/13 0800  BP: 127/42 105/83 121/47   Pulse: 78 69 75 99  Temp:  99.3 F (37.4 C)  98.9 F (37.2 C)  TempSrc:  Oral  Oral  Resp: 16 26 22 26   Height:      Weight:      SpO2: 95% 93% 98% 95%    Intake/Output Summary (Last 24 hours) at 09/19/13 1156 Last data filed at 09/19/13 0600  Gross per 24 hour  Intake 1085.72 ml  Output    900 ml  Net 185.72 ml    LABS: Basic Metabolic Panel:  Recent Labs  16/10/96 0435 09/19/13 0405  NA 137 136  K 3.9 3.0*  CL 104 100  CO2 25 26  GLUCOSE 159* 157*  BUN 13 11  CREATININE 0.75 0.72  CALCIUM 8.7 8.4  MG  --  1.8   CBC:  Recent Labs  09/18/13 0435 09/19/13 0405  WBC 8.2 4.1  HGB 9.7* 9.1*  HCT 29.0* 28.2*  MCV 91.2 90.7  PLT 298 235   Cardiac Enzymes:  Recent Labs  09/16/13 1805 09/17/13 0435  TROPONINI <0.30 <0.30  Radiology/Studies:   CXR with no acute process.  Physical Exam: Blood pressure 121/47, pulse 99, temperature 98.9 F (37.2 C), temperature source Oral, resp. rate 26, height 5\' 11"  (1.803 m), weight 183 lb 6.8 oz (83.2 kg), SpO2 95.00%. Weight change:    [place]/AT No JVD Bibasilar crackles- improved Cardiac exam is unremarkable except tachycardia. No edema is noted.  ASSESSMENT: 1. Atrial flutter with RVR. Not on adequate anticoagulation at the time of admission. Xarelto started. 2. Orthopedic pain. ? increase Dilaudid.  He has needed high doses of pain meds in the past, even for cath.  Plan:  1.  Amiodarone load IV 2. Electical cardioversion with TEE guidance scheduled for today.   Procedure explained.  Questions answered for patient and wife. Lasix for diastolic heart failure related to AFlutter.  Check LE venous DOpplers negative for DVT,   He is on Xarelto, but it is not the PE dose.  Pain control.  CAD- stents. No angina.  Signed, Lance Muss S. 09/19/2013, 11:56 AM  Just before his cardioversion, I was asked her views telemetry. It appears he converted to a junctional rhythm. We have canceled the TEE cardioversion.  Adalid Beckmann S.

## 2013-09-19 NOTE — Clinical Social Work Note (Addendum)
CSW has attempted to fax clinicals to [facility] through provider link several times. Several fax confirmations have been received but clinicals do not show up in ProviderLink. CSW will continue to work on SNF authorization.   Roddie Mc, [place], [place], [phone]

## 2013-09-19 NOTE — Progress Notes (Signed)
Physical Therapy Treatment Patient Details Name: Thomas [name] MRN: [id number] DOB: 08/05/1941 Today's Date: 09/19/2013 Time: 4098-1191 PT Time Calculation (min): 28 min  PT Assessment / Plan / Recommendation  History of Present Illness Pt admitted for elevated HR. Pt at SNF for rehab for R Total hip revision on 11/24.   PT Comments   Patient making gains with mobility and gait.  However remains impulsive and unsafe at times with gait.  Unable to remember hip precautions.  Agree with need for continued therapy at SNF at discharge.  Follow Up Recommendations  SNF     Does the patient have the potential to tolerate intense rehabilitation     Barriers to Discharge        Equipment Recommendations  None recommended by PT    Recommendations for Other Services    Frequency Min 4X/week   Progress towards PT Goals Progress towards PT goals: Progressing toward goals  Plan Current plan remains appropriate    Precautions / Restrictions Precautions Precautions: Posterior Hip Precaution Comments: Reviewed precautions Restrictions Weight Bearing Restrictions: Yes RLE Weight Bearing: Partial weight bearing RLE Partial Weight Bearing Percentage or Pounds: 50% Other Position/Activity Restrictions: No active abduction RLE   Pertinent Vitals/Pain Pain 7/10 with ambulation, limiting mobility    Mobility  Bed Mobility Bed Mobility: Supine to Sit;Sitting - Scoot to Edge of Bed;Sit to Supine Supine to Sit: 4: Min assist;HOB elevated Sitting - Scoot to Edge of Bed: 4: Min guard Sit to Supine: 4: Min guard;HOB elevated Details for Bed Mobility Assistance: Verbal cues for technique.  Assist to move RLE off of bed.  Verbal cues to remind patient of hip precautions with mobility. Transfers Transfers: Sit to Stand;Stand to Sit Sit to Stand: 4: Min assist;With upper extremity assist;From bed;From toilet Stand to Sit: 4: Min assist;With upper extremity assist;To bed;To toilet Details for Transfer  Assistance: Verbal cues for safe hand placement.  Patient continues to reach for RW when moving sit to stand.  Reminders of 50% WB status with gait. Ambulation/Gait Ambulation/Gait Assistance: 4: Min assist Ambulation Distance (Feet): 110 Feet Assistive device: Rolling walker Ambulation/Gait Assistance Details: Verbal cues for safe use of RW and gait sequence.  Impulsive, attempting to move lines/tubes.  Focused patient on holding onto RW for safety with gait.  Patient fatigues quickly, requiring 3 standing rest breaks during gait today. Gait Pattern: Step-to pattern;Antalgic;Trunk flexed;Narrow base of support Gait velocity: slow General Gait Details: pt extremely unsafe, moderately impulsive,  with increased UE fatigue      PT Goals (current goals can now be found in the care plan section)    Visit Information  Last PT Received On: 09/19/13 Assistance Needed: +1 History of Present Illness: Pt admitted for elevated HR. Pt at SNF for rehab for R Total hip revision on 11/24.    Subjective Data  Subjective: "I need my pain medicine every time after I walk"   Cognition  Cognition Arousal/Alertness: Awake/alert Behavior During Therapy: Anxious Overall Cognitive Status: Within Functional Limits for tasks assessed Area of Impairment: Safety/judgement General Comments: Patient asking to go to bathroom on own.  Reminded patient to ask for assist when needed.    Balance     End of Session PT - End of Session Equipment Utilized During Treatment: Gait belt Activity Tolerance: Patient limited by pain;Patient limited by fatigue Patient left: in bed;with call bell/phone within reach Nurse Communication: Mobility status;Patient requests pain meds   GP     Vena Austria 09/19/2013, 4:53 PM  Carita Pian Sanjuana Kava, [place] Pager [phone]

## 2013-09-20 ENCOUNTER — Telehealth: Payer: Self-pay | Admitting: *Deleted

## 2013-09-20 LAB — BASIC METABOLIC PANEL
BUN: 12 mg/dL (ref 6–23)
Creatinine, Ser: 0.67 mg/dL (ref 0.50–1.35)
GFR calc Af Amer: >90 mL/min (ref >90)
GFR calc non Af Amer: >90 mL/min (ref >90)

## 2013-09-20 LAB — GLUCOSE, CAPILLARY
Glucose-Capillary: 231 mg/dL — ABNORMAL HIGH (ref 70–99)
Glucose-Capillary: 212 mg/dL — ABNORMAL HIGH (ref 70–99)
Glucose-Capillary: 217 mg/dL — ABNORMAL HIGH (ref 70–99)
Glucose-Capillary: 177 mg/dL — ABNORMAL HIGH (ref 70–99)

## 2013-09-20 MED ORDER — INSULIN ASPART 100 UNIT/ML ~~LOC~~ SOLN: 0.0000 [IU] | Freq: Every day | SUBCUTANEOUS | Status: DC

## 2013-09-20 MED ORDER — INSULIN GLARGINE 100 UNIT/ML ~~LOC~~ SOLN
60.0000 [IU] | Freq: Every day | SUBCUTANEOUS | Status: DC
  Administered 2013-09-20 – 2013-09-21 (×2): 60 [IU] via SUBCUTANEOUS
  Filled 2013-09-20 (×2): qty 0.6

## 2013-09-20 MED ORDER — METOPROLOL TARTRATE 1 MG/ML IV SOLN: 5.0000 mg | INTRAVENOUS | Status: DC | PRN

## 2013-09-20 MED ORDER — FUROSEMIDE 40 MG PO TABS
40.0000 mg | ORAL_TABLET | Freq: Two times a day (BID) | ORAL | Status: DC
  Administered 2013-09-20 – 2013-09-21 (×2): 40 mg via ORAL
  Filled 2013-09-20 (×4): qty 1

## 2013-09-20 MED ORDER — AMIODARONE HCL 200 MG PO TABS
400.0000 mg | ORAL_TABLET | Freq: Two times a day (BID) | ORAL | Status: DC
  Administered 2013-09-20 – 2013-09-21 (×3): 400 mg via ORAL
  Filled 2013-09-20 (×4): qty 2

## 2013-09-20 MED ORDER — DILTIAZEM HCL ER COATED BEADS 180 MG PO CP24
180.0000 mg | ORAL_CAPSULE | Freq: Every day | ORAL | Status: DC
  Administered 2013-09-20 – 2013-09-21 (×2): 180 mg via ORAL
  Filled 2013-09-20 (×2): qty 1

## 2013-09-20 MED ORDER — INSULIN ASPART 100 UNIT/ML ~~LOC~~ SOLN
0.0000 [IU] | Freq: Three times a day (TID) | SUBCUTANEOUS | Status: DC
  Administered 2013-09-21: 2 [IU] via SUBCUTANEOUS

## 2013-09-20 NOTE — Care Management Note (Signed)
    Page 1 of 2   09/21/2013     10:41:34 AM   CARE MANAGEMENT NOTE 09/21/2013  Patient:  Thomas [name], Thomas [name]   Account Number:  [id number]  Date Initiated:  09/17/2013  Documentation initiated by:  Donn Pierini  Subjective/Objective Assessment:   Pt admitted with afib RVR- on cardizem gtt for rate control     Action/Plan:   PTA pt was at [facility] rehab after hip revision- CSW consulted for return rehab- pt would like new search done   Anticipated DC Date:  09/21/2013   Anticipated DC Plan:  HOME W HOME HEALTH SERVICES  In-house referral  Clinical Social Worker      DC Planning Services  CM consult      [facility] Choice  HOME HEALTH   Choice offered to / List presented to:  C-1 Patient        HH arranged  HH-1 RN  HH-2 PT  HH-6 SOCIAL WORKER      HH agency  [facility]   Status of service:  Completed, signed off Medicare Important Message given?   (If response is "NO", the following Medicare IM given date fields will be blank) Date Medicare IM given:   Date Additional Medicare IM given:    Discharge Disposition:  HOME W HOME HEALTH SERVICES  Per UR Regulation:  Reviewed for med. necessity/level of care/duration of stay  If discussed at Long Length of Stay Meetings, dates discussed:    Comments:  09/21/13- 1000- Donn Pierini RN, BSN [phone] Orders received for [facility]- referral called to [facility] with Genevieve Norlander- pt to be discharged today- [facility] services to begin within 24-48 hr post discharge.  09/20/13- 1030- Donn Pierini RN, BSN [phone] Spoke with pt and wife at bedside regarding d/c plans SNF vs HH- per conversation pt and wife really want to take pt home with [facility] at this point- pt had been at [facility] for rehab but was not satisfied there- discussed option of going to a different place for rehab- but wife and pt both feel like they can manage at home- discussed that [facility] does not come out on a daily basis- pt and wife voiced understanding of this as pt has had HH in  the past- wife states that she feels like she can handle pt at his current functional level and states that she has family close by that can assist her. List of [facility] agencies given for [facility]- per choice they would like to use [facility]- who is preferred provider for [facility]- wife would also like info on w/c transportation private pay options - will provide info on this for them. Discussed DME at home- and pt already has RW, W/C, BSC, and shower chair- at this time no other DME needed. Informed pt and wife that pt could still be placed into a [facility] rehab from home through pt's PCP if things do not work out as they plan at home. Both pt and wife voiced understanding- pt will need HH orders for RN/PT/CSW for home with [facility].

## 2013-09-20 NOTE — Progress Notes (Signed)
Patient ID: Thomas [name], male   DOB: 04/20/1941, 72 y.o.   MRN: [id number]  Had a chance to see Thomas [name] today. I reviewed with him and his wife their concerns  His incision is healed with out complication, no erythema, no drainage  He is moving his right leg freely around in bed without apparent issue.  They at this point despite therapy assessment and my discussion with them would love more than anything to go home with home theray  I will see them back in the office in 4 weeks, wife to call and make appointment  I will reach out to Dr. Lendell Caprice about plan

## 2013-09-20 NOTE — Progress Notes (Signed)
Physical Therapy Treatment Patient Details Name: Thomas [name] MRN: [id number] DOB: 1940-10-13 Today's Date: 09/20/2013 Time: 4098-1191 PT Time Calculation (min): 24 min  PT Assessment / Plan / Recommendation  History of Present Illness Pt admitted for elevated HR. Pt at SNF for rehab for R Total hip revision on 11/24.   PT Comments   Patient and wife declining SNF at discharge.  Provided wife and patient instruction: Reviewed hip precautions with patient and wife and how they impact patient's ability to complete tasks.  That patient needs physical assist with ambulation - any time he is up, someone must be with him.  Patient needs to move slowly, and focus on task at hand - not get distracted.  Also informed patient and wife that patient is quick to fatigue and needs frequent rest breaks during longer walks.    Patient and wife continue to decline SNF.  Recommended HHPT at discharge. During today's session, patient needed reminders of hip precautions.  Patient ambulated 120' with 3 standing rest breaks.  Needed redirection to focus on ambulation for safety - distracted.  Follow Up Recommendations  Home health PT;Supervision/Assistance - 24 hour     Does the patient have the potential to tolerate intense rehabilitation     Barriers to Discharge        Equipment Recommendations  None recommended by PT    Recommendations for Other Services    Frequency Min 4X/week   Progress towards PT Goals Progress towards PT goals: Progressing toward goals  Plan Discharge plan needs to be updated    Precautions / Restrictions Precautions Precautions: Posterior Hip Precaution Comments: Patient able to recall 1/3 hip precautions ("bending").  Reviewed all 3 with patient and wife. Restrictions Weight Bearing Restrictions: Yes RLE Weight Bearing: Partial weight bearing RLE Partial Weight Bearing Percentage or Pounds: 50% Other Position/Activity Restrictions: No active abduction RLE   Pertinent  Vitals/Pain Patient in A-fib.  RN reports OK to ambulate short distance.  HR increased to 138 during gait for short period.  Primarily between 116-125.    Mobility  Bed Mobility Bed Mobility: Supine to Sit;Sitting - Scoot to Edge of Bed Supine to Sit: 4: Min assist Sitting - Scoot to [place] of Bed: 4: Min guard Details for Bed Mobility Assistance: Verbal cues for technique to maintain hip precautions.  Cues to keep trunk upright when scooting to EOB - not flex forward. Transfers Transfers: Sit to Stand;Stand to Sit Sit to Stand: 4: Min guard;With upper extremity assist;From bed Stand to Sit: 4: Min guard;With upper extremity assist;With armrests;To chair/3-in-1 Details for Transfer Assistance: Verbal cues for safe hand placement and technique.  Reminders of 50% WB on RLE. Ambulation/Gait Ambulation/Gait Assistance: 4: Min guard Ambulation Distance (Feet): 120 Feet Assistive device: Rolling walker Ambulation/Gait Assistance Details: Verbal cues for safe use of RW and gait sequence.  Distracted by people in hallway - cues to focus on ambulation and gait sequence, and to remain close to RW.  After 70', patient with HR of 138.  Stood for rest break.   Gait Pattern: Step-through pattern;Decreased stance time - right;Decreased step length - left;Antalgic;Trunk flexed Gait velocity: slow General Gait Details: pt extremely unsafe, moderately impulsive,  with increased UE fatigue      PT Goals (current goals can now be found in the care plan section)    Visit Information  Last PT Received On: 09/20/13 Assistance Needed: +1 History of Present Illness: Pt admitted for elevated HR. Pt at SNF for rehab for R Total  hip revision on 11/24.    Subjective Data  Subjective: "I'm going home tomorrow"   Cognition  Cognition Arousal/Alertness: Awake/alert Behavior During Therapy: Anxious Overall Cognitive Status: Within Functional Limits for tasks assessed Area of Impairment:  Safety/judgement General Comments: Patient continues to be unaware of hip precautions.  Easily distracted from task at hand.  Spoke with patient and wife - focus on what he is doing.  Move slowly.    Balance     End of Session PT - End of Session Equipment Utilized During Treatment: Gait belt Activity Tolerance: Patient limited by pain;Patient limited by fatigue Patient left: in chair;with call bell/phone within reach;with family/visitor present Nurse Communication: Mobility status   GP     Vena Austria 09/20/2013, 4:03 PM Durenda Hurt. Renaldo Fiddler, [facility] Acute Rehab Services Pager [phone]

## 2013-09-20 NOTE — Progress Notes (Signed)
       Patient Name: Thomas [name] Date of Encounter: 09/20/2013    SUBJECTIVE: Still having orthopedic pain. At the rehab center, he has been noted to have a significant HR elevation day after day around 150 bpm.  HR better on IV cardizem, Amio.  SHOB Better now.  Converted to junctional rhythm yesterday. There were some intermittent P waves. More recently, he appears to be in normal sinus rhythm..  TELEMETRY:  Normal sinus rhythm Filed Vitals:   09/20/13 0800 09/20/13 1000 09/20/13 1050 09/20/13 1200  BP: 102/70 125/54 125/54 119/67  Pulse: 66 69  68  Temp:      TempSrc:      Resp: 17 24  23   Height:      Weight:      SpO2: 96% 97%  96%    Intake/Output Summary (Last 24 hours) at 09/20/13 1312 Last data filed at 09/20/13 1103  Gross per 24 hour  Intake 795.92 ml  Output   1100 ml  Net -304.08 ml    LABS: Basic Metabolic Panel:  Recent Labs  81/19/14 0405 09/20/13 0400  NA 136 134*  K 3.0* 3.6  CL 100 98  CO2 26 27  GLUCOSE 157* 144*  BUN 11 12  CREATININE 0.72 0.67  CALCIUM 8.4 8.6  MG 1.8  --    CBC:  Recent Labs  09/18/13 0435 09/19/13 0405 09/20/13 0400  WBC 8.2 4.1  --   HGB 9.7* 9.1* 9.0*  HCT 29.0* 28.2* 28.1*  MCV 91.2 90.7  --   PLT 298 235  --    Cardiac Enzymes: No results found for this basename: CKTOTAL, CKMB, CKMBINDEX, TROPONINI,  in the last 72 hours Radiology/Studies:   CXR with no acute process.  Physical Exam: Blood pressure 119/67, pulse 68, temperature 98.7 F (37.1 C), temperature source Oral, resp. rate 23, height 5\' 11"  (1.803 m), weight 178 lb 5.6 oz (80.9 kg), SpO2 96.00%. Weight change:    /AT No JVD Bibasilar crackles- improved Regular rate and rhythm, S1-S2 No edema is noted.  ASSESSMENT: 1. Atrial flutter with RVR. Not on adequate anticoagulation at the time of admission. Xarelto started. 2. Orthopedic pain. ? increase Dilaudid.  He has needed high doses of pain meds in the past, even for  cath.  Plan:  1. Amiodarone changed to oral formulation.  At the time of discharge, would send him home on amiodarone 400 mg daily. 2. Electical cardioversion with TEE guidance was canceled after he went into a junctional rhythm.  3. Questions answered for patient and wife. Lasix for diastolic heart failure related to AFlutter.  Check LE venous DOpplers negative for DVT,   He is on Xarelto, but it is not the PE dose.  Pain control.  CAD- stents. No angina.  Diltiazem changed back to his home dose. Cardizem CD 180 mg daily. Change furosemide to 40 mg by mouth twice a day.  Signed, Dhwani Venkatesh S. 09/20/2013, 1:12 PM

## 2013-09-20 NOTE — Progress Notes (Signed)
Discussed with Dr. Eldridge Dace, and RN  [facility] PROGRESS NOTE  Thomas [name] ONG:[id number] DOB: 1941-08-13 DOA: 09/16/2013 PCP: [name], MD  Assessment/Plan:  A fib RVR; converted to junctional rhythm yesterday before cardioversion. Now NSR. Remains on amio and cardizem gtt.  Dr. Eldridge Dace to transition to PO. D/C tomorrow if stable.  Hypokalemia: resolved. mag ok.  Acute on chronic CHF likely worse with A fib RVR; diastolic HF; echo (08/2013): LVEF 55%, grade 3 diastolic dysfunction No dyspnea today  S/p right total hip surgery 11/24-11/28; Per Dr. Boneta Lucks note, xray unchanged from postop. No surgery. Pt requesting a visit from Dr. Charlann Boxer. I have paged him.  Pt now wants to go home instead of SNF.  Per PT, pt is impulsive, forgets hip precautions, and she is concerned about fall potential at home. Will discuss with [facility].  PT to continue therapy daily  Chronic pain with chronic high dose opiates. Pain never below 6/10 per pt report, even prior to fx.  RN recommends pain management consult.  Pt declines, "counterproductive. I already go to pain clinic."  IDDM; unclear insulin regimen, HA1c-6.4; hold glipizide while NPO -cont lantus 60units + ISS;  5. CAD s/p PTCA  6. HTN BP labile;  ACE, imdur held due to low BP; cont cardizem for HR control; ;   7. Anemia chronic on iron; no s/s of acute bleeding; cont iron supplement   Multiple questions answered, discussed plan, labs, current clinical issues at great length. . Code Status: full Family Communication: wife at  The bedside  Disposition Plan: PT recommends SNF.  I agree, but pt may refuse   Consultants:  Ortho, cardiology  Procedures:  None   Antibiotics:  None  (indicate start date, and stop date if known)  Subjective: No dyspnea. Pain unchanged. Wants to see ortho  Objective: Filed Vitals:   09/20/13 0400 09/20/13 0600 09/20/13 0750 09/20/13 0800  BP: 106/50 93/38 113/47 102/70  Pulse: 68 63  66  Temp:  98.9 F (37.2 C)  98.7 F (37.1 C)   TempSrc: Oral  Oral   Resp: 17   17  Height:      Weight: 80.9 kg (178 lb 5.6 oz)     SpO2: 99% 99%  96%    Intake/Output Summary (Last 24 hours) at 09/20/13 0931 Last data filed at 09/20/13 0700  Gross per 24 hour  Intake 792.92 ml  Output   2175 ml  Net -1382.08 ml   Filed Weights   09/17/13 0456 09/18/13 0417 09/20/13 0400  Weight: 82.8 kg (182 lb 8.7 oz) 83.2 kg (183 lb 6.8 oz) 80.9 kg (178 lb 5.6 oz)   tele: NSR  Exam:   General:  Alert in chair, better mood  Cardiovascular: RRR without MGR  Respiratory: CTA BL without WRR  Abdomen: soft, nt, nd   Musculoskeletal: no LE edema   Data Reviewed: Basic Metabolic Panel:  Recent Labs Lab 09/16/13 1757 09/17/13 0435 09/19/13 0405 09/20/13 0400  NA 134* 137 136 134*  K 3.6 3.9 3.0* 3.6  CL 97 104 100 98  CO2 27 25 26 27   GLUCOSE 140* 159* 157* 144*  BUN 14 13 11 12   CREATININE 0.84 0.75 0.72 0.67  CALCIUM 8.8 8.7 8.4 8.6  MG  --   --  1.8  --    Liver Function Tests:  Recent Labs Lab 09/16/13 1757 09/17/13 0435  AST 22 18  ALT 14 12  ALKPHOS 112 107  BILITOT 0.4 0.3  PROT 6.8  6.2  ALBUMIN 3.1* 2.9*   No results found for this basename: LIPASE, AMYLASE,  in the last 168 hours No results found for this basename: AMMONIA,  in the last 168 hours CBC:  Recent Labs Lab 09/16/13 1757 09/17/13 0435 09/18/13 0435 09/19/13 0405 09/20/13 0400  WBC 6.8 6.8 8.2 4.1  --   HGB 9.7* 9.0* 9.7* 9.1* 9.0*  HCT 30.1* 27.8* 29.0* 28.2* 28.1*  MCV 92.0 91.1 91.2 90.7  --   PLT 339 284 298 235  --    Cardiac Enzymes:  Recent Labs Lab 09/16/13 1805 09/17/13 0435  TROPONINI <0.30 <0.30   BNP (last 3 results)  Recent Labs  09/17/13 1900  PROBNP 1110.0*   CBG:  Recent Labs Lab 09/19/13 1138 09/19/13 1615 09/19/13 2318 09/20/13 0356 09/20/13 0747  GLUCAP 172* 193* 212* 149* 142*    Recent Results (from the past 240 hour(s))  MRSA PCR SCREENING      Status: None   Collection Time    09/16/13 11:29 PM      Result Value Range Status   MRSA by PCR NEGATIVE  NEGATIVE Final   Comment:            The GeneXpert MRSA Assay (FDA     approved for NASAL specimens     only), is one component of a     comprehensive MRSA colonization     surveillance program. It is not     intended to diagnose MRSA     infection nor to guide or     monitor treatment for     MRSA infections.     Studies: No results found. Doppler legs: no DVT  Scheduled Meds: . amiodarone  400 mg Oral BID  . aspirin EC  81 mg Oral Daily  . atorvastatin  20 mg Oral q1800  . diltiazem  180 mg Oral Daily  . docusate sodium  100 mg Oral BID  . ferrous sulfate  325 mg Oral TID PC  . furosemide  40 mg Intravenous Q12H  . gabapentin  300 mg Oral TID  . insulin aspart  0-9 Units Subcutaneous Q4H  . insulin glargine  30 Units Subcutaneous Daily  . morphine  60 mg Oral Q12H  . potassium chloride  40 mEq Oral TID  . Rivaroxaban  20 mg Oral Q supper  . sodium chloride  3 mL Intravenous Q12H   Continuous Infusions:   Time spent: >35  Minutes   Barnard Sharps L  [facility] Pager [phone]. If 7PM-7AM, please contact night-coverage at [email or web address], password [facility] 09/20/2013, 9:31 AM  LOS: 4 days

## 2013-09-20 NOTE — Progress Notes (Signed)
Received word from central telemetry that the patient has flipped back into Afib. Rate is anywhere from the  90's to 120's. Notified Dr. Lendell Caprice and she is writing some new orders.

## 2013-09-20 NOTE — Telephone Encounter (Signed)
Thomas Thomas [name] called from [facility] where he has been admitted for hip surgery by Dr Charlann Boxer and subsequently some heart issues.  He was calling about getting his meds refilled.  I explained that standard procedure for patients who have surgery is that the surgeon (Dr Charlann Boxer) will prescribe pain medication during the post op period and then Thomas [name] will return to Dr Riley Kill for medication after Dr Nilsa Nutting office releases him.

## 2013-09-20 NOTE — Progress Notes (Signed)
Utilization review completed.  

## 2013-09-21 ENCOUNTER — Encounter: Payer: Medicare Other | Admitting: Physical Medicine & Rehabilitation

## 2013-09-21 LAB — BASIC METABOLIC PANEL
BUN: 13 mg/dL (ref 6–23)
Chloride: 103 mEq/L (ref 96–112)
Creatinine, Ser: 0.72 mg/dL (ref 0.50–1.35)
GFR calc Af Amer: >90 mL/min (ref >90)
GFR calc non Af Amer: >90 mL/min (ref >90)
Glucose, Bld: 154 mg/dL — ABNORMAL HIGH (ref 70–99)
Potassium: 3.8 mEq/L (ref 3.5–5.1)

## 2013-09-21 MED ORDER — RIVAROXABAN 20 MG PO TABS: 20.0000 mg | ORAL_TABLET | Freq: Every day | ORAL | Status: DC

## 2013-09-21 MED ORDER — OXYCODONE HCL 10 MG PO TABS: 10.0000 mg | ORAL_TABLET | ORAL | Status: DC | PRN

## 2013-09-21 MED ORDER — POTASSIUM CHLORIDE CRYS ER 20 MEQ PO TBCR: 40.0000 meq | EXTENDED_RELEASE_TABLET | Freq: Two times a day (BID) | ORAL | Status: DC

## 2013-09-21 MED ORDER — FUROSEMIDE 40 MG PO TABS: 40.0000 mg | ORAL_TABLET | Freq: Two times a day (BID) | ORAL | Status: DC

## 2013-09-21 MED ORDER — LISINOPRIL 10 MG PO TABS: 10.0000 mg | ORAL_TABLET | Freq: Every day | ORAL | Status: DC

## 2013-09-21 MED ORDER — AMIODARONE HCL 400 MG PO TABS: 400.0000 mg | ORAL_TABLET | Freq: Two times a day (BID) | ORAL | Status: DC

## 2013-09-21 MED ORDER — MORPHINE SULFATE ER 30 MG PO TBCR: 60.0000 mg | EXTENDED_RELEASE_TABLET | Freq: Two times a day (BID) | ORAL | Status: DC

## 2013-09-21 MED ORDER — AMIODARONE HCL 400 MG PO TABS: 400.0000 mg | ORAL_TABLET | Freq: Every day | ORAL | Status: DC

## 2013-09-21 NOTE — Progress Notes (Addendum)
Pt to be d/c home with HH, et all., VSS, reviewed d/c instructions; follow up appts, inc Rx, info on [place].

## 2013-09-21 NOTE — Progress Notes (Signed)
CSW reviewed chart and CM noted that Pt is returning Home with HHPT.   No further needs at this time.   CSW signing off.     Leron Croak [facility]  [address] 1[address];  [phone] Phone: [phone]

## 2013-09-21 NOTE — Discharge Summary (Signed)
Physician Discharge Summary  Thomas [name] AVW:[id number] DOB: March 19, 1941 DOA: 09/16/2013  PCP: [name], MD  Admit date: 09/16/2013 Discharge date: 09/21/2013  Time spent: greater than 30 minutes  Recommendations for Outpatient Follow-up:  1. back SNF recommended by PT, orhto and myself. 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 male with Past medical history of diabetes mellitus, coronary disease, hypertension, peripheral vascular disease, AAA, CK-MB, osteoarthritis with repeated hip arthroplasty.  The patient is coming from SNF.  The patient is presenting with complaints of elevated heart rate. He mentions that since last 5 days due to his daily evaluation in the rehabilitation facility they have found that his heart rate has been persistently elevated. Patient mentions"they have tried to lower the heart rate but they couldn't".  Along with that the patient mentions that he had pain in his hip that is persistent since the surgery and increasing the worsening since last few days. The pain is located primarily in the hip and worsens with movement and radiates to his knee. He does mentions that post surgery he always have some swelling of his legs which is not worsening and he also denies any tenderness in his legs.  Patient denies any complaints of chest pain or palpitation or shortness of breath. He mentions occasional dizziness. He denies any fever chills cough orthopnea or PND.  He mentions he is compliant with all the  medications.  He also mentions his urinary frequency has also reduced. He denies any constipation  Hospital Course:  Admitted to SDU.  Started on cardizem gtt. Cardiology consulted. Amiodarone gtt started. xarelto dose increased to 20 mg daily.  TEE cardioversion scheduled, but patient converted to NSR, so it was cancelled. However, continues to have PAF, but rate controlled.   Dr. Charlann Boxer saw pt.  Xray unchanged from immediate postop films and no surgery indicated.  Patient on chronic high dose opiates.  Continued to c/o 6/10 pain.  He reports pain never below 6, even prior to surgery.  Palliative care consult offerred, patient refused.  PT recommends continued SNF.  Noted to be impulsive, poor balance, and forgets to follow hip precautions Patient refuses continued SNF.    Procedures:  none  Consultations:  Cardiology  ortho  Discharge Exam: Filed Vitals:   09/21/13 0840  BP:   Pulse: 67  Temp:   Resp: 19    General: alert, oriented. comfortable Cardiovascular: RRR Respiratory: CTA  Ext no CCE  Discharge Instructions  Discharge Orders   Future Appointments Provider Department Dept Phone   11/30/2013 11:15 AM Everette Rank, MD [facility] [phone]   Future Orders Complete By Expires   Diet - low sodium heart healthy  As directed    Diet Carb Modified  As directed    Discharge instructions  As directed    Comments:     Partial weight bearing (50%), right leg. No hip abduction.   Driving Restrictions  As directed    Comments:  No driving   Increase activity slowly  As directed        Medication List    STOP taking these medications       APIDRA SOLOSTAR 100 UNIT/ML Sopn  Generic drug:  Insulin Glulisine      TAKE these medications       amiodarone 400 MG tablet  Commonly known as:  PACERONE  Take 1 tablet (400 mg total) by mouth daily.     aspirin EC 81 MG tablet  Take 81 mg by mouth daily.     diltiazem 180 MG 24 hr capsule   Commonly known as:  CARDIZEM CD  Take 1 capsule (180 mg total) by mouth daily.     DSS 100 MG Caps  Take 100 mg by mouth 2 (two) times daily.     fenofibrate micronized 134 MG capsule  Commonly known as:  LOFIBRA  Take 134 mg by mouth daily before breakfast.     ferrous sulfate 325 (65 FE) MG tablet  Take 1 tablet (325 mg total) by mouth 3 (three) times daily after meals.     fish oil-omega-3 fatty acids 1000 MG capsule  Take 1 g by mouth daily.     furosemide 40 MG tablet  Commonly known as:  LASIX  Take 1 tablet (40 mg total) by mouth 2 (two) times daily.     gabapentin 300 MG capsule  Commonly known as:  NEURONTIN  Take 300 mg by mouth 3 (three) times daily. 1 am, 1 lunch, 1 bedtime     glipiZIDE 10 MG tablet  Commonly known as:  GLUCOTROL  Take 10 mg by mouth daily.     HUMALOG KWIKPEN 100 UNIT/ML Sopn  Generic drug:  insulin lispro  Inject 30 Units as directed every morning.     insulin glargine 100 UNIT/ML injection  Commonly known as:  LANTUS  Inject 60 Units into the skin every morning.     isosorbide mononitrate 30 MG 24 hr tablet  Commonly known as:  IMDUR  Take 30 mg by mouth every morning.     lisinopril 10 MG tablet  Commonly known as:  PRINIVIL  Take 1 tablet (10 mg total) by mouth daily.     metFORMIN 500 MG tablet  Commonly known as:  GLUCOPHAGE  Take 1,000 mg by mouth daily.     morphine 30 MG 12 hr tablet  Commonly known as:  MS CONTIN  Take 2 tablets (60 mg total) by mouth every 12 (twelve) hours.     nitroGLYCERIN 0.4 MG SL tablet  Commonly known as:  NITROSTAT  Place 0.4 mg under the tongue every 5 (five) minutes as needed.     Oxycodone HCl 10 MG Tabs  Take 1-2 tablets (10-20 mg total) by mouth every 4 (four) hours as needed.     potassium chloride SA 20 MEQ tablet  Commonly known as:  K-DUR,KLOR-CON  Take 2 tablets (40 mEq total) by mouth 2 (two) times daily.     Rivaroxaban 20 MG Tabs tablet  Commonly known as:  XARELTO  Take  1 tablet (20 mg total) by mouth daily with supper.     rosuvastatin 10 MG tablet  Commonly known as:  CRESTOR  Take 10 mg by mouth every morning.     saccharomyces boulardii 250 MG capsule  Commonly known as:  FLORASTOR  Take 250 mg by mouth daily.     zolpidem 10 MG tablet  Commonly known as:  AMBIEN  Take 10 mg by mouth at bedtime as needed for sleep.       Allergies  Allergen Reactions  . Nsaids Diarrhea       Follow-up Information   Follow up with Shelda Pal, MD. Schedule an appointment as soon as possible for a visit in 4 weeks.   Specialty:  Orthopedic Surgery   Contact information:   [address] [place] [address] [phone]       Follow up with Corky Crafts., MD. (in January, or sooner if needed)    Specialty:  Cardiology   Contact information:   [address] [place] [address] [phone]       Follow up with [name], MD In 1 week.   Specialty:  Family Medicine   Contact information:   [facility] [place] [address] [phone]        The results of significant diagnostics from this hospitalization (including imaging, microbiology, ancillary and laboratory) are listed below for reference.    Significant Diagnostic Studies: Dg Chest 2 View  08/30/2013   CLINICAL DATA:  Preop right total hip  EXAM: CHEST  2 VIEW  COMPARISON:  None.  FINDINGS: The heart size and mediastinal contours are within normal limits. Both lungs are clear. There is chronic widening of the right AC joint.  IMPRESSION: No active cardiopulmonary disease.   Electronically Signed   By: Elige Ko   On: 08/30/2013 14:07   Dg Pelvis Portable  09/17/2013   CLINICAL DATA:  Right hip pain, status post recent right hip replacement.  EXAM: PORTABLE PELVIS 1-2 VIEWS  COMPARISON:  Pelvis radiograph performed 09/03/2013  FINDINGS: The patient's right hip arthroplasty demonstrates increased lucency about the  socket at the acetabulum, and also about the proximal femur. Would recommend clinical correlation to exclude particle disease. There is no evidence of fracture. The distal aspect of the arthroplasty is incompletely imaged, and the known cerclage wire is not seen.  The left hip joint is unremarkable in appearance. The sacroiliac joints are within normal limits. The visualized bowel gas pattern is grossly unremarkable.  IMPRESSION: 1. Right hip arthroplasty demonstrates increased lucency about the socket at the acetabulum, and also about the proximal femur. Would recommend clinical correlation to exclude particle disease. 2. No evidence of fracture; the distal aspect of the arthroplasty is incompletely imaged, and the known cerclage wire is not seen.   Electronically Signed   By: Roanna Raider M.D.   On: 09/17/2013 00:30   Dg Pelvis Portable  09/03/2013   CLINICAL DATA:  Right hip prosthesis revision  EXAM: PORTABLE PELVIS 1-2 VIEWS  COMPARISON:  10/08/2010  FINDINGS: A new right hip prosthesis is in place with cerclage noted along the proximal femoral metaphysis. Two screws affix the acetabular shell component 30 degrees of lateral inclination of the acetabular shell.  Atherosclerosis is present.  There is abnormal appearing scalloping of the proximal femoral cortex in the vicinity of the cerclage wire. Appearance raises concern for possible proximal femoral metaphyseal fracture in the immediate vicinity of the cerclage wire.  IMPRESSION: 1. Subtle findings raising the possibility of proximal femoral metaphyseal fracture in the vicinity of the cerclage wire. Correlate with findings at surgery. CT might be utilized for confirmation, if clinically warranted.   Electronically Signed   By: Herbie Baltimore M.D.   On: 09/03/2013 13:46   Dg Chest Port 1 View  09/16/2013   CLINICAL DATA:  Cardiac arrhythmia  EXAM: PORTABLE CHEST -  1 VIEW  COMPARISON:  08/30/2013.  FINDINGS: The heart size and mediastinal contours  are within normal limits. Mild tortuosity and calcification of the thoracic aorta is again demonstrated. Both lungs are clear. The visualized skeletal structures are unremarkable.  IMPRESSION: No acute cardiopulmonary findings.   Electronically Signed   By: Loralie Champagne M.D.   On: 09/16/2013 18:38   Dg Hip Portable 1 View Right  09/17/2013   CLINICAL DATA:  Pain, evaluate for fracture  EXAM: PORTABLE RIGHT HIP - 1 VIEW  COMPARISON:  Prior radiographs of the right hip 05/02/2013  FINDINGS: Periprosthetic fracture of the lateral aspect of the humerus in the region of the cerclage wire. The fracture fragment including the greater trochanter is displaced slightly laterally resulting in approximately 11 mm of gap between the femoral component and greater trochanter. The femoral head component appears located within the acetabular component. The visualized bony pelvis is intact. Atherosclerotic calcifications noted throughout the superficial femoral artery.  IMPRESSION: Periprosthetic fracture in the region of the cerclage wire. The fracture fragment including the greater trochanter is displaced laterally by approximately 11 mm at the superior aspect of the femoral neck prosthesis.   Electronically Signed   By: Malachy Moan M.D.   On: 09/17/2013 15:50   Dg Hip Portable 1 View Right  09/17/2013   CLINICAL DATA:  Right hip pain, status post recent right hip replacement.  EXAM: PORTABLE RIGHT HIP - 1 VIEW  COMPARISON:  None.  FINDINGS: The cross-table lateral view is limited in evaluation for loosening or particle disease. However, there is no definite evidence of fracture. The cerclage wire is grossly unremarkable in appearance. The distal aspect of the prosthesis appears grossly intact.  IMPRESSION: No definite evidence of fracture; the cerclage wire and distal aspect of the prosthesis appear grossly intact. There was question for changes of particle disease on the concurrent pelvis radiograph; would suggest  clinical correlation.   Electronically Signed   By: Roanna Raider M.D.   On: 09/17/2013 00:33   Dg Hip Portable 1 View Right  09/03/2013   CLINICAL DATA:  Right hip replacement.  EXAM: PORTABLE RIGHT HIP - 1 VIEW  COMPARISON:  Right hips 09/03/2013 and 05/02/2013.  FINDINGS: Portable cross-table lateral of the right hip obtained. A right hip prosthesis appears to be well-seated and in good anatomic alignment.  IMPRESSION: Right hip prosthesis with good anatomic alignment.   Electronically Signed   By: Maisie Fus  Register   On: 09/03/2013 13:34   Dg Hip Portable 1 View Right  09/03/2013   CLINICAL DATA:  Right hip replacement.  EXAM: PORTABLE RIGHT HIP - 1 VIEW  COMPARISON:  MRI right hip 07/22/2013, right hip series 05/02/2013.  FINDINGS: Patient status post right hip replacement revision. Good anatomic alignment noted on cross-table lateral portable view. Surgical drainage noted adjacent to the hip.  IMPRESSION: Patient status post right hip replacement revision. Good anatomic alignment on one view .   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 It is not     intended to diagnose MRSA     infection nor to guide or     monitor treatment for     MRSA infections.     Labs: Basic Metabolic Panel:  Recent Labs Lab 09/16/13 1757 09/17/13 0435 09/19/13 0405 09/20/13 0400 09/21/13 0405  NA 134* 137 136 134* 140  K 3.6 3.9 3.0* 3.6 3.8  CL 97 104  100 98 103  CO2 27 25 26 27 27   GLUCOSE 140* 159* 157* 144* 154*  BUN 14 13 11 12 13   CREATININE 0.84 0.75 0.72 0.67 0.72  CALCIUM 8.8 8.7 8.4 8.6 8.5  MG  --   --  1.8  --   --    Liver Function Tests:  Recent Labs Lab 09/16/13 1757 09/17/13 0435  AST 22 18  ALT 14 12  ALKPHOS 112 107  BILITOT 0.4 0.3  PROT 6.8 6.2  ALBUMIN 3.1* 2.9*   No results found for this basename: LIPASE, AMYLASE,  in the last 168 hours No results found for this basename: AMMONIA,  in the last 168 hours CBC:  Recent Labs Lab 09/16/13 1757 09/17/13 0435 09/18/13 0435 09/19/13 0405 09/20/13 0400  WBC 6.8 6.8 8.2 4.1  --   HGB 9.7* 9.0* 9.7* 9.1* 9.0*  HCT 30.1* 27.8* 29.0* 28.2* 28.1*  MCV 92.0 91.1 91.2 90.7  --   PLT 339 284 298 235  --    Cardiac Enzymes:  Recent Labs Lab 09/16/13 1805 09/17/13 0435  TROPONINI <0.30 <0.30   BNP: BNP (last 3 results)  Recent Labs  09/17/13 1900  PROBNP 1110.0*   CBG:  Recent Labs Lab 09/20/13 0747 09/20/13 1218 09/20/13 1739 09/20/13 2135 09/21/13 0809  GLUCAP 142* 217* 186* 177* 157*     Signed:  Dusty Wagoner L  [facility] 09/21/2013, 11:04 AM

## 2013-09-21 NOTE — Progress Notes (Addendum)
       Patient Name: Thomas [name] Date of Encounter: 09/21/2013    SUBJECTIVE: Improved orthopedic pain.  Feels well.  Converted to oral meds yesterday. he appears to be in normal sinus rhythm..  TELEMETRY:  Normal sinus rhythm Filed Vitals:   09/20/13 2145 09/20/13 2300 09/21/13 0326 09/21/13 0440  BP: 119/47 123/53 137/53   Pulse: 76 73 81 71  Temp:  98.7 F (37.1 C) 98.5 F (36.9 C)   TempSrc:  Oral Oral   Resp: 25 22 17 17   Height:      Weight:   176 lb 5.9 oz (80 kg)   SpO2: 98% 100% 96% 97%    Intake/Output Summary (Last 24 hours) at 09/21/13 0801 Last data filed at 09/21/13 0700  Gross per 24 hour  Intake    243 ml  Output   1025 ml  Net   -782 ml    LABS: Basic Metabolic Panel:  Recent Labs  40/98/11 0405 09/20/13 0400 09/21/13 0405  NA 136 134* 140  K 3.0* 3.6 3.8  CL 100 98 103  CO2 26 27 27   GLUCOSE 157* 144* 154*  BUN 11 12 13   CREATININE 0.72 0.67 0.72  CALCIUM 8.4 8.6 8.5  MG 1.8  --   --    CBC:  Recent Labs  09/19/13 0405 09/20/13 0400  WBC 4.1  --   HGB 9.1* 9.0*  HCT 28.2* 28.1*  MCV 90.7  --   PLT 235  --    Cardiac Enzymes: No results found for this basename: CKTOTAL, CKMB, CKMBINDEX, TROPONINI,  in the last 72 hours Radiology/Studies:   CXR with no acute process.  Physical Exam: Blood pressure 137/53, pulse 71, temperature 98.5 F (36.9 C), temperature source Oral, resp. rate 17, height 5\' 11"  (1.803 m), weight 176 lb 5.9 oz (80 kg), SpO2 97.00%. Weight change: -1 lb 15.7 oz (-0.9 kg)   [place]/AT No JVD Bibasilar crackles- improved Regular rate and rhythm, S1-S2 No edema is noted.  ASSESSMENT: 1. Atrial flutter with RVR. Now in NSR. Not on adequate anticoagulation at the time of admission. Xarelto started. 2. Orthopedic pain.-better. He has needed high doses of pain meds in the past, even for cath.  Plan:  1. Amiodarone changed to oral formulation.  At the time of discharge, would send him home on amiodarone 400  mg daily. 2. Continue Xarelto for stroke prevention. 3. Lasix for diastolic heart failure related to AFlutter.  Would send out on Lasix 40 mg PO BID along with potassium 40 mEq PO BID  Pain control for hip.  CAD- stents. No angina.  Diltiazem changed back to his home dose. Cardizem CD 180 mg daily.  Will need BMet in a week or so at his rehab facility.   Patient desaturated last night during sleep.  O2 sats normal while awake.  May need a sleep study down the road.  I removed his oxygen this AM and informed the nurse.  F/u with Me in January unless problems occur sooner.   Signed, Lance Muss S. 09/21/2013, 8:01 AM

## 2013-09-25 ENCOUNTER — Encounter: Payer: Medicare Other | Attending: Physical Medicine & Rehabilitation | Admitting: Physical Medicine & Rehabilitation

## 2013-09-25 ENCOUNTER — Encounter: Payer: Self-pay | Admitting: Physical Medicine & Rehabilitation

## 2013-09-25 VITALS — BP 113/52 | HR 79 | Resp 14 | Ht 71.0 in | Wt 188.0 lb

## 2013-09-25 DIAGNOSIS — Z96649 Presence of unspecified artificial hip joint: Secondary | ICD-10-CM

## 2013-09-25 DIAGNOSIS — M2170 Unequal limb length (acquired), unspecified site: Secondary | ICD-10-CM

## 2013-09-25 DIAGNOSIS — M1610 Unilateral primary osteoarthritis, unspecified hip: Secondary | ICD-10-CM

## 2013-09-25 DIAGNOSIS — M76899 Other specified enthesopathies of unspecified lower limb, excluding foot: Secondary | ICD-10-CM

## 2013-09-25 DIAGNOSIS — G5710 Meralgia paresthetica, unspecified lower limb: Secondary | ICD-10-CM | POA: Insufficient documentation

## 2013-09-25 DIAGNOSIS — E785 Hyperlipidemia, unspecified: Secondary | ICD-10-CM | POA: Insufficient documentation

## 2013-09-25 DIAGNOSIS — M25559 Pain in unspecified hip: Secondary | ICD-10-CM | POA: Insufficient documentation

## 2013-09-25 DIAGNOSIS — IMO0002 Reserved for concepts with insufficient information to code with codable children: Secondary | ICD-10-CM | POA: Insufficient documentation

## 2013-09-25 DIAGNOSIS — I10 Essential (primary) hypertension: Secondary | ICD-10-CM | POA: Insufficient documentation

## 2013-09-25 DIAGNOSIS — M7061 Trochanteric bursitis, right hip: Secondary | ICD-10-CM

## 2013-09-25 DIAGNOSIS — E119 Type 2 diabetes mellitus without complications: Secondary | ICD-10-CM | POA: Insufficient documentation

## 2013-09-25 DIAGNOSIS — I2510 Atherosclerotic heart disease of native coronary artery without angina pectoris: Secondary | ICD-10-CM | POA: Insufficient documentation

## 2013-09-25 MED ORDER — MORPHINE SULFATE ER 30 MG PO TBCR: 60.0000 mg | EXTENDED_RELEASE_TABLET | Freq: Two times a day (BID) | ORAL | Status: DC

## 2013-09-25 MED ORDER — OXYCODONE HCL 20 MG PO TABS: 20.0000 mg | ORAL_TABLET | ORAL | Status: DC | PRN

## 2013-09-25 NOTE — Progress Notes (Signed)
Subjective:    Patient ID: Thomas [name], male    DOB: May 12, 1941, 72 y.o.   MRN: [id number]  HPI  Thomas [name] is back regarding his right hip pain. He had his hip prosthesis removal and revision on 09/03/13 by Dr. Charlann Boxer. His pain has remained fairly consistent since the surgery. He went to a SNF for 10 days. Around the surgery he was going in and out of afib and had to go back to the hospital for 5 days. He has now been home about a week or so. Home health PT, RN has been coming out to the house. He uses his wheelchair and walker to get around the home.   His pain is slow and dull with an occasional sharp shooting pain down the leg. He feels that the intensity overall is better.      Pain Inventory Average Pain 7 Pain Right Now 5 My pain is sharp and stabbing  In the last 24 hours, has pain interfered with the following? General activity 7 Relation with others 5 Enjoyment of life 7 What TIME of day is your pain at its worst? morning Sleep (in general) Fair  Pain is worse with: walking Pain improves with: medication Relief from Meds: 7  Mobility walk with assistance use a walker how many minutes can you walk? 5 ability to climb steps?  yes do you drive?  yes needs help with transfers Do you have any goals in this area?  yes  Function retired I need assistance with the following:  household duties and shopping Do you have any goals in this area?  no  Neuro/Psych trouble walking  Prior Studies Any changes since last visit?  yes bone scan x-rays CT/MRI  Physicians involved in your care Any changes since last visit?  no   Family History  Problem Relation Age of Onset  . Emphysema Mother   . Cancer Father     Brain  . Diabetes Son    History   Social History  . Marital Status: Married    Spouse Name: N/A    Number of Children: N/A  . Years of Education: N/A   Occupational History  . Retired    Social History Main Topics  . Smoking status: Former Smoker     Types: Cigarettes    Quit date: 10/11/2005  . Smokeless tobacco: Never Used  . Alcohol Use: Yes     Comment: occasionally  . Drug Use: No  . Sexual Activity: None   Other Topics Concern  . None   Social History Narrative  . None   Past Surgical History  Procedure Laterality Date  . Shoulder surgery      right  . Carpal tunnel release  2006    right   . Hip fracture surgery  11/2007    right  . Joint replacement  2009    right total hip replacement  . Coronary angioplasty with stent placement  05/30/2007    Mid RCA 3.0 x 15 mm vision BMS, distal RCA 2.5 x 12 mm vision BMS, PDA 2.0 x 12 mm vision BMS  . Tonsillectomy      as child  . Total hip revision Right 09/03/2013    Procedure: RIGHT TOTAL HIP REVISION;  Surgeon: Shelda Pal, MD;  Location: [facility];  Service: Orthopedics;  Laterality: Right;  . Atrial flutter ablation  11/03/2006  . Coronary angioplasty with stent placement  10/06/2007    LM 25, LAD 25, D1 50, D2  50, CFX mild dz, OM1 diffuse dz, OM 2 100 w/ L-L collaterals, mid-RCA stent with mild ISR, distal RCA stent with > 90 ISR, subtotal PL, Rx w/ 2.5 x 15-mm Promus DES. EF 55%,    Past Medical History  Diagnosis Date  . Diabetes mellitus   . CAD (coronary artery disease)   . Hypertension   . Claudication in peripheral vascular disease 07/08/2011    Left external iliac occlusion with collaterals; mild-moderate right external iliac disease; moderate calcified left popliteal disease  . Carotid artery disease 2012    < 50% stenosis bilaterally  . Hyperlipidemia   . AAA (abdominal aortic aneurysm)   . Chronic kidney disease     hx kidney stones  . Arthritis   . 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 Acute diastolic heart failure   . Shortness of breath    BP 113/52  Pulse 79  Resp 14  Ht 5\' 11"  (1.803 m)  Wt 188 lb (85.276 kg)  BMI 26.23 kg/m2  SpO2 96%      Review of Systems  Constitutional: Positive for  diaphoresis and unexpected weight change.  Gastrointestinal: Positive for constipation.  Musculoskeletal: Positive for gait problem.       Objective:   Physical Exam Constitutional: He is oriented to person, place, and time. He appears well-developed and well-nourished.  HENT:  Head: Normocephalic and atraumatic.  Nose: Nose normal.  Mouth/Throat: Oropharynx is clear and moist.  Eyes: EOM are normal. Pupils are equal, round, and reactive to light.  Neck: Normal range of motion.  Cardiovascular: Normal rate and regular rhythm.  Pulmonary/Chest: Effort normal and breath sounds normal.  Abdominal: Soft. He exhibits no distension. There is no tenderness.  Musculoskeletal:  Lumbar back: He exhibits decreased range of motion, tenderness, bony tenderness, pain and spasm.  His hip is still tender to touch, there is atrophy of the proximal muscles of the right hip. He is able to move the hip in all planes. It is most tender with resisted AB duction Neurological: He is alert and oriented to person, place, and time.  Psychiatric: He has a normal mood and affect. His behavior is normal. Judgment and thought content normal.       ASSESSMENT:  1. 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 ?Metallosis.  2. Meralgia paresthetica, left leg.  3. Lumbar radiculopathy.  contribution from his right trochanteric bursitis as well.  4. Right leg length discrepancy.    PLAN:  1. Continue Neurontin 300 mg t.i.d.  2. Will refill the oxycodone for breakthrough pain 20mg  #135, one q4 prn. 3. MS contin for baseline pain control was refilled. We can look at weaning this over the winter depending upon his recovery. 30mg  one q12 #60 4. Right hip revision per Dr. Charlann Boxer on 09/03/13.  He now needs to invest in rehab to strengthen the leg, learn good technique, posture.  I think he should see substantial progress. It already appears that the intensity and quality of his pain is less.  5.  Ambien for sleep.

## 2013-09-25 NOTE — Patient Instructions (Signed)
CONTINUE TO WORK ON YOUR STRENGTH TRAINING AND GAIT TRAINING

## 2013-10-16 ENCOUNTER — Telehealth: Payer: Self-pay

## 2013-10-16 ENCOUNTER — Ambulatory Visit (INDEPENDENT_AMBULATORY_CARE_PROVIDER_SITE_OTHER): Payer: Medicare Other | Admitting: Interventional Cardiology

## 2013-10-16 ENCOUNTER — Encounter: Payer: Self-pay | Admitting: Interventional Cardiology

## 2013-10-16 VITALS — BP 132/68 | HR 80 | Ht 71.0 in | Wt 181.0 lb

## 2013-10-16 DIAGNOSIS — Z7901 Long term (current) use of anticoagulants: Secondary | ICD-10-CM

## 2013-10-16 DIAGNOSIS — E782 Mixed hyperlipidemia: Secondary | ICD-10-CM

## 2013-10-16 DIAGNOSIS — I2510 Atherosclerotic heart disease of native coronary artery without angina pectoris: Secondary | ICD-10-CM

## 2013-10-16 DIAGNOSIS — I4892 Unspecified atrial flutter: Secondary | ICD-10-CM

## 2013-10-16 DIAGNOSIS — I5031 Acute diastolic (congestive) heart failure: Secondary | ICD-10-CM

## 2013-10-16 LAB — BASIC METABOLIC PANEL
BUN: 18 mg/dL (ref 6–23)
CHLORIDE: 99 meq/L (ref 96–112)
CO2: 31 meq/L (ref 19–32)
CREATININE: 1 mg/dL (ref 0.4–1.5)
Calcium: 9.5 mg/dL (ref 8.4–10.5)
GFR: 74.98 mL/min (ref >60.00)
Glucose, Bld: 265 mg/dL — ABNORMAL HIGH (ref 70–99)
Potassium: 4 mEq/L (ref 3.5–5.1)
SODIUM: 138 meq/L (ref 135–145)

## 2013-10-16 MED ORDER — FUROSEMIDE 40 MG PO TABS: 40.0000 mg | ORAL_TABLET | Freq: Every day | ORAL | Status: DC

## 2013-10-16 MED ORDER — AMIODARONE HCL 400 MG PO TABS: 200.0000 mg | ORAL_TABLET | Freq: Every day | ORAL | Status: DC

## 2013-10-16 MED ORDER — POTASSIUM CHLORIDE CRYS ER 20 MEQ PO TBCR: 40.0000 meq | EXTENDED_RELEASE_TABLET | Freq: Every day | ORAL | Status: DC

## 2013-10-16 NOTE — Telephone Encounter (Signed)
Patient called requesting to stop by the clinic and pickup his RX even though he has an appt for 10/22/13. Contacted to inform him that refill was not due until 10/26/13. Patient will get refill at appt on 10/22/13.

## 2013-10-16 NOTE — Progress Notes (Signed)
Patient ID: Thomas [name], male   DOB: May 20, 1941, 73 y.o.   MRN: [id number]    [address] [place], [place]  [address] Phone: [phone] Fax:  [phone]  Date:  10/16/2013   ID:  Thomas [name], DOB 1941-05-25, MRN [id number]  PCP:  [name], MD      History of Present Illness: Thomas [name] is a 73 y.o. male coronary artery disease and peripheral vascular disease. He has an occluded left external iliac artery. His left leg blood flow issues no longer limit him. No further C. difficile colitis. He denies any chest discomfort like his prior angina.  He had right hip surgery reently complicated by atrial flutter.  He converted to NSR with amiodarone.    Wt Readings from Last 3 Encounters:  10/16/13 181 lb (82.101 kg)  09/25/13 188 lb (85.276 kg)  09/21/13 176 lb 5.9 oz (80 kg)     Past Medical History  Diagnosis Date  . Diabetes mellitus   . CAD (coronary artery disease)   . Hypertension   . Claudication in peripheral vascular disease 07/08/2011    Left external iliac occlusion with collaterals; mild-moderate right external iliac disease; moderate calcified left popliteal disease  . Carotid artery disease 2012    < 50% stenosis bilaterally  . Hyperlipidemia   . AAA (abdominal aortic aneurysm)   . Chronic kidney disease     hx kidney stones  . Arthritis   . 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 Acute diastolic heart failure   . Shortness of breath     Current Outpatient Prescriptions  Medication Sig Dispense Refill  . amiodarone (PACERONE) 400 MG tablet Take 1 tablet (400 mg total) by mouth daily.  30 tablet  0  . aspirin EC 81 MG tablet Take 81 mg by mouth daily.       Marland Kitchen diltiazem (CARDIZEM CD) 180 MG 24 hr capsule Take 1 capsule (180 mg total) by mouth daily.  60 capsule  0  . fenofibrate micronized (LOFIBRA) 134 MG capsule Take 134 mg by mouth daily before breakfast.       . ferrous sulfate 325 (65 FE) MG tablet Take 1  tablet (325 mg total) by mouth 3 (three) times daily after meals.    3  . fish oil-omega-3 fatty acids 1000 MG capsule Take 1 g by mouth daily.      . furosemide (LASIX) 40 MG tablet Take 1 tablet (40 mg total) by mouth 2 (two) times daily.  60 tablet  0  . gabapentin (NEURONTIN) 300 MG capsule Take 300 mg by mouth 3 (three) times daily. 1 am, 1 lunch, 1 bedtime      . glipiZIDE (GLUCOTROL) 10 MG tablet Take 10 mg by mouth daily.       Marland Kitchen HUMALOG KWIKPEN 100 UNIT/ML SOPN Inject 30 Units as directed every morning.       . insulin glargine (LANTUS) 100 UNIT/ML injection Inject 60 Units into the skin every morning.       . isosorbide mononitrate (IMDUR) 30 MG 24 hr tablet Take 30 mg by mouth every morning.       Marland Kitchen lisinopril (PRINIVIL) 10 MG tablet Take 1 tablet (10 mg total) by mouth daily.  30 tablet  0  . metFORMIN (GLUCOPHAGE) 500 MG tablet Take 1,000 mg by mouth daily.       Marland Kitchen morphine (MS CONTIN) 30 MG 12 hr tablet Take 2 tablets (60  mg total) by mouth every 12 (twelve) hours.  60 tablet  0  . nitroGLYCERIN (NITROSTAT) 0.4 MG SL tablet Place 0.4 mg under the tongue every 5 (five) minutes as needed.       . Oxycodone HCl 20 MG TABS Take 1 tablet (20 mg total) by mouth every 4 (four) hours as needed.  135 tablet  0  . potassium chloride SA (K-DUR,KLOR-CON) 20 MEQ tablet Take 2 tablets (40 mEq total) by mouth 2 (two) times daily.  60 tablet  0  . Rivaroxaban (XARELTO) 20 MG TABS tablet Take 1 tablet (20 mg total) by mouth daily with supper.  30 tablet  0  . rosuvastatin (CRESTOR) 10 MG tablet Take 10 mg by mouth every morning.       . saccharomyces boulardii (FLORASTOR) 250 MG capsule Take 250 mg by mouth daily.      Marland Kitchen zolpidem (AMBIEN) 10 MG tablet Take 10 mg by mouth at bedtime as needed for sleep.       . VOLTAREN 1 % GEL        No current facility-administered medications for this visit.    Allergies:    Allergies  Allergen Reactions  . Nsaids Diarrhea    Social History:  The patient   reports that he quit smoking about 8 years ago. His smoking use included Cigarettes. He smoked 0.00 packs per day. He has never used smokeless tobacco. He reports that he drinks alcohol. He reports that he does not use illicit drugs.   Family History:  The patient's family history includes Cancer in his father; Diabetes in his son; Emphysema in his mother.   ROS:  Please see the history of present illness.  No nausea, vomiting.  No fevers, chills.  No focal weakness.  No dysuria. Leg pain;   All other systems reviewed and negative.   PHYSICAL EXAM: VS:  BP 132/68  Pulse 80  Ht 5\' 11"  (1.803 m)  Wt 181 lb (82.101 kg)  BMI 25.26 kg/m2 Well nourished, well developed, in no acute distress HEENT: normal Neck: no JVD, no carotid bruits Cardiac:  normal S1, S2; RRR;  Lungs:  clear to auscultation bilaterally, no wheezing, rhonchi or rales Abd: soft, nontender, no hepatomegaly Ext: no edema Skin: warm and dry Neuro:   no focal abnormalities noted      ASSESSMENT AND PLAN:  CAD in native artery  Continue Nitroglycerin 0.4 mg tablet, 0.4 mg, 1 tablet as directed, SL, as directed prn chest pain, 25, Refills 6 Stop Aspirin EC Lo-Dose Tablet Delayed Release, 81 MG, 1 tablet, Orally, Once a day Continue Isosorbide Mononitrate Tablet Extended Release 24 Hour, 30 MG, 2 tablet, Orally, Once a day Notes: No exertional angina. he does have some left chest pain with some similarites to prior angina, but some differences. . Would consider restarting Plavix. He is happy to take one less medicine at this time. Would plan on Lexiscan Cardiolite since he has chronic right hip pain.  2. Pure hypercholesterolemia  Continue Crestor Tablet, 10 MG, 1 tablet, Orally, Once a day, 90, Refills 3 Notes: LDL 98 TG elevated in the past. Checked by PMD.  3. Essential hypertension, benign  Continue Lisinopril tablet, 10 mg, 1 tablet, Orally, once a day, 90, Refills 3 Off Metoprolol Succinate Tablet Extended Release 24  Hour, 25 MG, 1 tablet, Orally, Once a day Notes: COntrolled at drug store. Continue lisinopril given beneficial secondary effects for kidneys and MI prevention.  4.  Diastolic heart failure:  Decrease Lasix to 40 mg daily.  Decrease potassium to 40 mEq daily. BMet today. 5.  Anticoagulation: COntinue Xarelto for now.  Can stop 2 days prior to dental procedere.  Restart date depends on risk of bleeding.  6.  AFlutter: Decrease amiodarone to 200 mg daily.   OK for physical therapy occupational therapy.  No cardiac restrictions.     Signed, Fredric MareJay S. Johnnie Moten, MD, [facility] 10/16/2013 2:12 PM

## 2013-10-16 NOTE — Patient Instructions (Signed)
Your physician recommends that you return for lab work today for Bmet.  Your physician recommends that you schedule a follow-up appointment in 3 mths with Dr. Eldridge DaceVaranasi.  Your physician has recommended you make the following change in your medication:   1. Decrease Amiodarone to 200mg  daily.  2. Decrease Lasix to 40 mg daily.  3. Decrease Potassium to 40 MEQ daily.

## 2013-10-18 ENCOUNTER — Other Ambulatory Visit: Payer: Self-pay | Admitting: *Deleted

## 2013-10-18 DIAGNOSIS — M2170 Unequal limb length (acquired), unspecified site: Secondary | ICD-10-CM

## 2013-10-18 DIAGNOSIS — M7061 Trochanteric bursitis, right hip: Secondary | ICD-10-CM

## 2013-10-18 DIAGNOSIS — M1610 Unilateral primary osteoarthritis, unspecified hip: Secondary | ICD-10-CM

## 2013-10-18 DIAGNOSIS — Z96649 Presence of unspecified artificial hip joint: Secondary | ICD-10-CM

## 2013-10-18 DIAGNOSIS — M76899 Other specified enthesopathies of unspecified lower limb, excluding foot: Secondary | ICD-10-CM

## 2013-10-18 MED ORDER — MORPHINE SULFATE ER 30 MG PO TBCR: 60.0000 mg | EXTENDED_RELEASE_TABLET | Freq: Two times a day (BID) | ORAL | Status: DC

## 2013-10-18 MED ORDER — OXYCODONE HCL 20 MG PO TABS: 20.0000 mg | ORAL_TABLET | ORAL | Status: DC | PRN

## 2013-10-18 MED ORDER — RIVAROXABAN 20 MG PO TABS: 20.0000 mg | ORAL_TABLET | Freq: Every day | ORAL | Status: DC

## 2013-10-18 NOTE — Telephone Encounter (Signed)
RX printed early for controlled medication for the visit with RN on 10/22/13 (to be signed by MD) 

## 2013-10-22 ENCOUNTER — Encounter: Payer: Medicare Other | Attending: Physical Medicine & Rehabilitation | Admitting: *Deleted

## 2013-10-22 ENCOUNTER — Telehealth: Payer: Self-pay | Admitting: *Deleted

## 2013-10-22 ENCOUNTER — Encounter: Payer: Self-pay | Admitting: *Deleted

## 2013-10-22 DIAGNOSIS — IMO0002 Reserved for concepts with insufficient information to code with codable children: Secondary | ICD-10-CM | POA: Insufficient documentation

## 2013-10-22 DIAGNOSIS — Z96649 Presence of unspecified artificial hip joint: Secondary | ICD-10-CM | POA: Insufficient documentation

## 2013-10-22 DIAGNOSIS — Z79899 Other long term (current) drug therapy: Secondary | ICD-10-CM | POA: Insufficient documentation

## 2013-10-22 DIAGNOSIS — G5710 Meralgia paresthetica, unspecified lower limb: Secondary | ICD-10-CM | POA: Insufficient documentation

## 2013-10-22 DIAGNOSIS — M5116 Intervertebral disc disorders with radiculopathy, lumbar region: Secondary | ICD-10-CM

## 2013-10-22 DIAGNOSIS — M7060 Trochanteric bursitis, unspecified hip: Secondary | ICD-10-CM

## 2013-10-22 DIAGNOSIS — M1610 Unilateral primary osteoarthritis, unspecified hip: Secondary | ICD-10-CM

## 2013-10-22 DIAGNOSIS — T84099A Other mechanical complication of unspecified internal joint prosthesis, initial encounter: Secondary | ICD-10-CM | POA: Insufficient documentation

## 2013-10-22 DIAGNOSIS — Y831 Surgical operation with implant of artificial internal device as the cause of abnormal reaction of the patient, or of later complication, without mention of misadventure at the time of the procedure: Secondary | ICD-10-CM | POA: Insufficient documentation

## 2013-10-22 DIAGNOSIS — M76899 Other specified enthesopathies of unspecified lower limb, excluding foot: Secondary | ICD-10-CM

## 2013-10-22 NOTE — Progress Notes (Signed)
Here for pill count and medication refills.Pain is not as bad as it used to be and he is walking better today.  Oxycodone  20 mg # 135 Fill date 09/25/13    Today NV# 0 MS Contin 30 mg # 60  Today NV# 0  VSS    Pain level:4  Refills given. Return to see RN in one month and 2 months with DR Riley KillSwartz or PA. Would like to change the MS Contin from (2) 30mg  q 12 hours to 60 mg q 12 hours. Addendum to this note:  I spoke with Dr Riley KillSwartz about his prescription for the MS Contin 30 mg (2 tabs q 12) because the dispense amount has been for # 60.  This is only a 15 day supply and not a 30 day supply.  He agrees that we can change this to 60 mg bid # 60, but in the meantime, Mr Thomas [name] will be able to pick up a prescription for another 15 day supply to get him through to his next appt  11/19/13.  This rx will be printed when he calls for the refill.

## 2013-10-22 NOTE — Patient Instructions (Addendum)
Follow up one month with RN for med refill and pill count and 2 month with DR Riley KillSwartz or Marissa NestlePam Love PA

## 2013-10-22 NOTE — Telephone Encounter (Signed)
Thomas NajjarLarry was in to see me today for refill.  He saw you last month.  In looking closely at his rx,

## 2013-10-24 ENCOUNTER — Telehealth: Payer: Self-pay

## 2013-10-24 NOTE — Telephone Encounter (Signed)
I left message for Thomas [name] letting him know we are aware of the pill shortage.  I added an addendum to my visit note detailing this.  He is to call back when he has about 2 days left and we will given him another  prescription.  This can be changed to 60 mg MS Contin q 12 # 60 instead of taking (2) 30 mg tabs q 12. The dispense # for the ( 2) 30mg  tabs q 12 should have been # 120 instead of #60 for a 30 day supply.

## 2013-10-24 NOTE — Telephone Encounter (Signed)
Patient called and said that if he take his Morphine bid he will be out before 30 days because there is only 60 tablets. Is this a PRN med?

## 2013-10-24 NOTE — Telephone Encounter (Signed)
This has been addressed with Sybil.  We will give new rx when he runs out.

## 2013-10-25 ENCOUNTER — Other Ambulatory Visit: Payer: Self-pay | Admitting: *Deleted

## 2013-10-25 MED ORDER — RIVAROXABAN 20 MG PO TABS: 20.0000 mg | ORAL_TABLET | Freq: Every day | ORAL | Status: DC

## 2013-11-09 ENCOUNTER — Telehealth: Payer: Self-pay | Admitting: Cardiology

## 2013-11-09 ENCOUNTER — Encounter: Payer: Self-pay | Admitting: Cardiology

## 2013-11-09 NOTE — Telephone Encounter (Signed)
Xarelto 20 mg approved through 11/09/14 [id number].

## 2013-11-12 ENCOUNTER — Telehealth: Payer: Self-pay | Admitting: *Deleted

## 2013-11-12 NOTE — Telephone Encounter (Signed)
Thomas NajjarLarry called about his appt 11/19/13.  He needed to change appt due to conflicts with PT. I called him back because of the note I recorded at last visit about his morphine 30 mg rx was for #60 instead of #120. He said he will be able to make it until he sees Dr Riley KillSwartz on the new date of 11/16/13. His MS CONTIN is to be changed to 60 mg i bid #60 instead of 30 mg ii bid.

## 2013-11-15 ENCOUNTER — Other Ambulatory Visit: Payer: Self-pay | Admitting: *Deleted

## 2013-11-15 MED ORDER — MORPHINE SULFATE ER 60 MG PO TBCR
60.0000 mg | EXTENDED_RELEASE_TABLET | Freq: Two times a day (BID) | ORAL | Status: DC
Start: 2013-11-15 — End: 2013-12-17

## 2013-11-15 NOTE — Telephone Encounter (Signed)
RX printed for MD to sign prior to visit due to change in dispense (per MD) from MS Contin 30 mg ii bid to 60 mg i bid.

## 2013-11-16 ENCOUNTER — Encounter: Payer: Medicare Other | Attending: Physical Medicine and Rehabilitation | Admitting: Physical Medicine & Rehabilitation

## 2013-11-16 ENCOUNTER — Encounter: Payer: Self-pay | Admitting: Physical Medicine & Rehabilitation

## 2013-11-16 VITALS — BP 132/59 | HR 71 | Resp 14 | Ht 71.0 in | Wt 189.0 lb

## 2013-11-16 DIAGNOSIS — Z96649 Presence of unspecified artificial hip joint: Secondary | ICD-10-CM | POA: Insufficient documentation

## 2013-11-16 DIAGNOSIS — M76899 Other specified enthesopathies of unspecified lower limb, excluding foot: Secondary | ICD-10-CM | POA: Insufficient documentation

## 2013-11-16 DIAGNOSIS — M5116 Intervertebral disc disorders with radiculopathy, lumbar region: Secondary | ICD-10-CM

## 2013-11-16 DIAGNOSIS — M7060 Trochanteric bursitis, unspecified hip: Secondary | ICD-10-CM

## 2013-11-16 DIAGNOSIS — M2170 Unequal limb length (acquired), unspecified site: Secondary | ICD-10-CM | POA: Insufficient documentation

## 2013-11-16 DIAGNOSIS — M5126 Other intervertebral disc displacement, lumbar region: Secondary | ICD-10-CM

## 2013-11-16 DIAGNOSIS — M1610 Unilateral primary osteoarthritis, unspecified hip: Secondary | ICD-10-CM | POA: Insufficient documentation

## 2013-11-16 DIAGNOSIS — G5710 Meralgia paresthetica, unspecified lower limb: Secondary | ICD-10-CM

## 2013-11-16 MED ORDER — OXYCODONE HCL 20 MG PO TABS: 20.0000 mg | ORAL_TABLET | ORAL | Status: DC | PRN

## 2013-11-16 NOTE — Progress Notes (Signed)
Subjective:    Patient ID: Thomas [name], male    DOB: [birth year], 73 y.o.   MRN: [id number]  HPI  Thomas [name] is back regarding his hip revision surgery and chronic pain. He is having pain in a familiar area but it seems to be improving. [place]  He is using a cane for ambulation. He has lost some strength in his leg as well as some of his confidence with walking.   He just returned to driving.   Pain Inventory Average Pain 4 Pain Right Now 4 My pain is sharp, stabbing and aching  In the last 24 hours, has pain interfered with the following? General activity 3 Relation with others 2 Enjoyment of life 3 What TIME of day is your pain at its worst? morning Sleep (in general) Fair  Pain is worse with: walking Pain improves with: medication and TENS Relief from Meds: 8  Mobility walk without assistance use a cane how many minutes can you walk? 20 ability to climb steps?  yes do you drive?  yes transfers alone Do you have any goals in this area?  yes  Function retired I need assistance with the following:  meal prep and shopping Do you have any goals in this area?  yes  Neuro/Psych depression  Prior Studies Any changes since last visit?  no  Physicians involved in your care Any changes since last visit?  no   Family History  Problem Relation Age of Onset  . Emphysema Mother   . Cancer Father     Brain  . Diabetes Son    History   Social History  . Marital Status: Married    Spouse Name: N/A    Number of Children: N/A  . Years of Education: N/A   Occupational History  . Retired    Social History Main Topics  . Smoking status: Former Smoker    Types: Cigarettes    Quit date: 10/11/2005  . Smokeless tobacco: Never Used  . Alcohol Use: Yes     Comment: occasionally  . Drug Use: No  . Sexual Activity: None   Other Topics Concern  . None   Social History Narrative  . None   Past Surgical History  Procedure  Laterality Date  . Shoulder surgery      right  . Carpal tunnel release  2006    right   . Hip fracture surgery  11/2007    right  . Joint replacement  2009    right total hip replacement  . Coronary angioplasty with stent placement  05/30/2007    Mid RCA 3.0 x 15 mm vision BMS, distal RCA 2.5 x 12 mm vision BMS, PDA 2.0 x 12 mm vision BMS  . Tonsillectomy      as child  . Total hip revision Right 09/03/2013    Procedure: RIGHT TOTAL HIP REVISION;  Surgeon: Shelda PalMatthew D Olin, MD;  Location: [facility];  Service: Orthopedics;  Laterality: Right;  . Atrial flutter ablation  11/03/2006  . Coronary angioplasty with stent placement  10/06/2007    LM 25, LAD 25, D1 50, D2 50, CFX mild dz, OM1 diffuse dz, OM 2 100 w/ L-L collaterals, mid-RCA stent with mild ISR, distal RCA stent with > 90 ISR, subtotal PL, Rx w/ 2.5 x 15-mm Promus DES. EF 55%,    Past Medical History  Diagnosis Date  . Diabetes mellitus   . CAD (coronary artery disease)   . Hypertension   .  Claudication in peripheral vascular disease 07/08/2011    Left external iliac occlusion with collaterals; mild-moderate right external iliac disease; moderate calcified left popliteal disease  . Carotid artery disease 2012    < 50% stenosis bilaterally  . Hyperlipidemia   . AAA (abdominal aortic aneurysm)   . Chronic kidney disease     hx kidney stones  . Arthritis   . 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 Acute diastolic heart failure   . Shortness of breath    BP 132/59  Pulse 71  Resp 14  Ht 5\' 11"  (1.803 m)  Wt 189 lb (85.73 kg)  BMI 26.37 kg/m2  SpO2 98%  Opioid Risk Score:   Fall Risk Score: Moderate Fall Risk (6-13 points) (patient educated handout declined)   Review of Systems  Constitutional: Positive for diaphoresis.  Psychiatric/Behavioral: Positive for dysphoric mood.  All other systems reviewed and are negative.       Objective:   Physical Exam  Constitutional: He is oriented  to person, place, and time. He appears well-developed and well-nourished.  HENT:  Head: Normocephalic and atraumatic.  Nose: Nose normal.  Mouth/Throat: Oropharynx is clear and moist.  Eyes: EOM are normal. Pupils are equal, round, and reactive to light.  Neck: Normal range of motion.  Cardiovascular: Normal rate and regular rhythm.  Pulmonary/Chest: Effort normal and breath sounds normal.  Abdominal: Soft. He exhibits no distension. There is no tenderness.  Musculoskeletal:  Lumbar back: He exhibits decreased range of motion, tenderness, bony tenderness, pain and spasm.  His hip is still tender to touch, there is atrophy of the proximal muscles of the right hip. He is able to move the hip in all planes. It is most tender with resisted AB duction Neurological: He is alert and oriented to person, place, and time.  Psychiatric: He has a normal mood and affect. His behavior is normal. Judgment and thought content normal.     ASSESSMENT:  1. 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  2. Meralgia paresthetica, left leg.  3. Lumbar radiculopathy.  contribution from his right trochanteric bursitis as well.  4. Right leg length discrepancy.    PLAN:  1. Continue Neurontin 300 mg t.i.d.  2. Will refill the oxycodone for breakthrough pain 20mg  #135, one q4 prn.  3. MS contin for baseline pain control 60mg  q12. #60. 4. Rehab for right hip per Dr. Charlann Boxer. He begins on Monday.  5. Ambien for sleep.   Follow up with RN in one month. 15 minutes of face to face patient care time were spent during this visit. All questions were encouraged and answered.

## 2013-11-16 NOTE — Patient Instructions (Signed)
PLEASE CALL ME WITH ANY PROBLEMS OR QUESTIONS (#[phone]).      

## 2013-11-19 ENCOUNTER — Ambulatory Visit (INDEPENDENT_AMBULATORY_CARE_PROVIDER_SITE_OTHER): Payer: Medicare Other

## 2013-11-19 DIAGNOSIS — Z96649 Presence of unspecified artificial hip joint: Secondary | ICD-10-CM

## 2013-11-23 ENCOUNTER — Encounter: Payer: Medicare Other | Admitting: Physical Therapy

## 2013-11-26 ENCOUNTER — Encounter (INDEPENDENT_AMBULATORY_CARE_PROVIDER_SITE_OTHER): Payer: Medicare Other | Admitting: Physical Therapy

## 2013-11-26 DIAGNOSIS — M25659 Stiffness of unspecified hip, not elsewhere classified: Secondary | ICD-10-CM

## 2013-11-26 DIAGNOSIS — Z96649 Presence of unspecified artificial hip joint: Secondary | ICD-10-CM

## 2013-11-26 DIAGNOSIS — R269 Unspecified abnormalities of gait and mobility: Secondary | ICD-10-CM

## 2013-11-26 DIAGNOSIS — M25559 Pain in unspecified hip: Secondary | ICD-10-CM

## 2013-11-29 ENCOUNTER — Encounter (INDEPENDENT_AMBULATORY_CARE_PROVIDER_SITE_OTHER): Payer: Medicare Other | Admitting: Physical Therapy

## 2013-11-29 DIAGNOSIS — M25659 Stiffness of unspecified hip, not elsewhere classified: Secondary | ICD-10-CM

## 2013-11-29 DIAGNOSIS — Z96649 Presence of unspecified artificial hip joint: Secondary | ICD-10-CM

## 2013-11-29 DIAGNOSIS — R269 Unspecified abnormalities of gait and mobility: Secondary | ICD-10-CM

## 2013-11-29 DIAGNOSIS — M25559 Pain in unspecified hip: Secondary | ICD-10-CM

## 2013-11-30 ENCOUNTER — Ambulatory Visit: Payer: Medicare Other | Admitting: Interventional Cardiology

## 2013-12-03 ENCOUNTER — Encounter (INDEPENDENT_AMBULATORY_CARE_PROVIDER_SITE_OTHER): Payer: Medicare Other | Admitting: Physical Therapy

## 2013-12-03 DIAGNOSIS — M25559 Pain in unspecified hip: Secondary | ICD-10-CM

## 2013-12-03 DIAGNOSIS — Z96649 Presence of unspecified artificial hip joint: Secondary | ICD-10-CM

## 2013-12-03 DIAGNOSIS — M25659 Stiffness of unspecified hip, not elsewhere classified: Secondary | ICD-10-CM

## 2013-12-03 DIAGNOSIS — R269 Unspecified abnormalities of gait and mobility: Secondary | ICD-10-CM

## 2013-12-06 ENCOUNTER — Encounter: Payer: Medicare Other | Admitting: Physical Therapy

## 2013-12-11 DIAGNOSIS — T8489XA Other specified complication of internal orthopedic prosthetic devices, implants and grafts, initial encounter: Secondary | ICD-10-CM

## 2013-12-13 ENCOUNTER — Encounter (INDEPENDENT_AMBULATORY_CARE_PROVIDER_SITE_OTHER): Payer: Medicare Other | Admitting: Physical Therapy

## 2013-12-13 DIAGNOSIS — R269 Unspecified abnormalities of gait and mobility: Secondary | ICD-10-CM

## 2013-12-13 DIAGNOSIS — Z96649 Presence of unspecified artificial hip joint: Secondary | ICD-10-CM

## 2013-12-13 DIAGNOSIS — M25659 Stiffness of unspecified hip, not elsewhere classified: Secondary | ICD-10-CM

## 2013-12-13 DIAGNOSIS — M25559 Pain in unspecified hip: Secondary | ICD-10-CM

## 2013-12-17 ENCOUNTER — Encounter: Payer: Self-pay | Admitting: Physical Medicine & Rehabilitation

## 2013-12-17 ENCOUNTER — Encounter: Payer: Medicare Other | Attending: Physical Medicine and Rehabilitation | Admitting: Physical Medicine & Rehabilitation

## 2013-12-17 VITALS — BP 132/63 | HR 70 | Resp 14 | Ht 71.0 in | Wt 191.0 lb

## 2013-12-17 DIAGNOSIS — M76899 Other specified enthesopathies of unspecified lower limb, excluding foot: Secondary | ICD-10-CM | POA: Insufficient documentation

## 2013-12-17 DIAGNOSIS — M1610 Unilateral primary osteoarthritis, unspecified hip: Secondary | ICD-10-CM | POA: Insufficient documentation

## 2013-12-17 DIAGNOSIS — M5116 Intervertebral disc disorders with radiculopathy, lumbar region: Secondary | ICD-10-CM

## 2013-12-17 DIAGNOSIS — Z96649 Presence of unspecified artificial hip joint: Secondary | ICD-10-CM | POA: Insufficient documentation

## 2013-12-17 DIAGNOSIS — G5710 Meralgia paresthetica, unspecified lower limb: Secondary | ICD-10-CM

## 2013-12-17 DIAGNOSIS — M7060 Trochanteric bursitis, unspecified hip: Secondary | ICD-10-CM

## 2013-12-17 DIAGNOSIS — M5126 Other intervertebral disc displacement, lumbar region: Secondary | ICD-10-CM

## 2013-12-17 DIAGNOSIS — M2170 Unequal limb length (acquired), unspecified site: Secondary | ICD-10-CM | POA: Insufficient documentation

## 2013-12-17 MED ORDER — OXYCODONE HCL 20 MG PO TABS: 20.0000 mg | ORAL_TABLET | Freq: Four times a day (QID) | ORAL | Status: DC | PRN

## 2013-12-17 MED ORDER — MORPHINE SULFATE ER 30 MG PO TBCR: 30.0000 mg | EXTENDED_RELEASE_TABLET | Freq: Two times a day (BID) | ORAL | Status: DC

## 2013-12-17 NOTE — Patient Instructions (Signed)
PLEASE CALL ME WITH ANY PROBLEMS OR QUESTIONS (#[phone]).      

## 2013-12-17 NOTE — Progress Notes (Signed)
Subjective:    Patient ID: Thomas [name], male    DOB: 1941/04/27, 73 y.o.   MRN: [id number]  HPI  Rashee is back regarding his chronic leg pain. He continues to go to outpt therapy to work on gait, ROM, stretching, pain control. His right hip muscles are very tight, and therapy is working on them.  He is still using a cane for ambulation. He has some external rotation in the right hip.   He's had no falls or near misses.    He is emptying his bladder. The lasix is making him urinate quite a bit.   He has been able to decrease to 30mg  ms contin for pain control  He is using about 3 of the oxy ir's per day.      Pain Inventory Average Pain 3 Pain Right Now 5 My pain is sharp, stabbing and aching  In the last 24 hours, has pain interfered with the following? General activity 4 Relation with others 4 Enjoyment of life 6 What TIME of day is your pain at its worst? morning Sleep (in general) Fair  Pain is worse with: walking and standing Pain improves with: rest and medication Relief from Meds: 7  Mobility walk with assistance use a cane how many minutes can you walk? 15 ability to climb steps?  yes do you drive?  yes Do you have any goals in this area?  no  Function retired Do you have any goals in this area?  no  Neuro/Psych No problems in this area  Prior Studies Any changes since last visit?  no  Physicians involved in your care Any changes since last visit?  no   Family History  Problem Relation Age of Onset  . Emphysema Mother   . Cancer Father     Brain  . Diabetes Son    History   Social History  . Marital Status: Married    Spouse Name: N/A    Number of Children: N/A  . Years of Education: N/A   Occupational History  . Retired    Social History Main Topics  . Smoking status: Former Smoker    Types: Cigarettes    Quit date: 10/11/2005  . Smokeless tobacco: Never Used  . Alcohol Use: Yes     Comment: occasionally  . Drug Use: No  .  Sexual Activity: None   Other Topics Concern  . None   Social History Narrative  . None   Past Surgical History  Procedure Laterality Date  . Shoulder surgery      right  . Carpal tunnel release  2006    right   . Hip fracture surgery  11/2007    right  . Joint replacement  2009    right total hip replacement  . Coronary angioplasty with stent placement  05/30/2007    Mid RCA 3.0 x 15 mm vision BMS, distal RCA 2.5 x 12 mm vision BMS, PDA 2.0 x 12 mm vision BMS  . Tonsillectomy      as child  . Total hip revision Right 09/03/2013    Procedure: RIGHT TOTAL HIP REVISION;  Surgeon: Shelda Pal, MD;  Location: [facility];  Service: Orthopedics;  Laterality: Right;  . Atrial flutter ablation  11/03/2006  . Coronary angioplasty with stent placement  10/06/2007    LM 25, LAD 25, D1 50, D2 50, CFX mild dz, OM1 diffuse dz, OM 2 100 w/ L-L collaterals, mid-RCA stent with mild ISR, distal RCA stent  with > 90 ISR, subtotal PL, Rx w/ 2.5 x 15-mm Promus DES. EF 55%,    Past Medical History  Diagnosis Date  . Diabetes mellitus   . CAD (coronary artery disease)   . Hypertension   . Claudication in peripheral vascular disease 07/08/2011    Left external iliac occlusion with collaterals; mild-moderate right external iliac disease; moderate calcified left popliteal disease  . Carotid artery disease 2012    < 50% stenosis bilaterally  . Hyperlipidemia   . AAA (abdominal aortic aneurysm)   . Chronic kidney disease     hx kidney stones  . Arthritis   . 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 Acute diastolic heart failure   . Shortness of breath    BP 132/63  Pulse 70  Resp 14  Ht 5\' 11"  (1.803 m)  Wt 191 lb (86.637 kg)  BMI 26.65 kg/m2  SpO2 99%  Opioid Risk Score:   Fall Risk Score: Moderate Fall Risk (6-13 points) (pt educated on fall risk, pt was given brochure previously)    Review of Systems  Constitutional: Positive for diaphoresis.  Respiratory:  Positive for apnea.   Endocrine:       High blood sugar  Musculoskeletal: Positive for gait problem.  All other systems reviewed and are negative.       Objective:   Physical Exam  Constitutional: He is oriented to person, place, and time. He appears well-developed and well-nourished.  HENT:  Head: Normocephalic and atraumatic.  Nose: Nose normal.  Mouth/Throat: Oropharynx is clear and moist.  Eyes: EOM are normal. Pupils are equal, round, and reactive to light.  Neck: Normal range of motion.  Cardiovascular: Normal rate and regular rhythm.  Pulmonary/Chest: Effort normal and breath sounds normal.  Abdominal: Soft. He exhibits no distension. There is no tenderness.  Musculoskeletal:  Lumbar back: He exhibits decreased range of motion, tenderness, bony tenderness, pain and spasm.  His gait is noticeable for external rotation which improves when he concentrates. His transferring is improved. Weight shift is better. External rotators are tight at the hip. Neurological: He is alert and oriented to person, place, and time.  Psychiatric: He has a normal mood and affect. His behavior is normal. Judgment and thought content normal.     ASSESSMENT:  1. 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  2. Meralgia paresthetica, left leg.  3. Lumbar radiculopathy.  contribution from his right trochanteric bursitis as well.  4. Right leg length discrepancy.    PLAN:  1. Continue Neurontin 300 mg t.i.d.  2. Will refill the oxycodone for breakthrough pain 20mg  #13725m, one q6 prn.  3. MS contin for baseline pain control--decrease to 30mg  q12.   4. Continue to work on gait, pain control, and improved ROM. He can be more aggressive with hip internal rotation stretching when ortho directs him so. I would hope that this can be soon given the time frame of his sugery.  5. Ambien for sleep.  Follow up with RN or PA in one month. I will see him back in  about 3 months. . 15 minutes of face to face patient care time were spent during this visit. All questions were encouraged and answered.

## 2013-12-19 ENCOUNTER — Other Ambulatory Visit: Payer: Self-pay | Admitting: Interventional Cardiology

## 2013-12-19 ENCOUNTER — Encounter (INDEPENDENT_AMBULATORY_CARE_PROVIDER_SITE_OTHER): Payer: Medicare Other | Admitting: Physical Therapy

## 2013-12-19 DIAGNOSIS — Z96649 Presence of unspecified artificial hip joint: Secondary | ICD-10-CM

## 2013-12-19 DIAGNOSIS — M25559 Pain in unspecified hip: Secondary | ICD-10-CM

## 2013-12-19 DIAGNOSIS — R269 Unspecified abnormalities of gait and mobility: Secondary | ICD-10-CM

## 2013-12-19 DIAGNOSIS — M25659 Stiffness of unspecified hip, not elsewhere classified: Secondary | ICD-10-CM

## 2013-12-19 MED ORDER — AMIODARONE HCL 400 MG PO TABS: 200.0000 mg | ORAL_TABLET | Freq: Every day | ORAL | Status: DC

## 2013-12-19 MED ORDER — FUROSEMIDE 40 MG PO TABS: 40.0000 mg | ORAL_TABLET | Freq: Every day | ORAL | Status: DC

## 2013-12-21 ENCOUNTER — Encounter (INDEPENDENT_AMBULATORY_CARE_PROVIDER_SITE_OTHER): Payer: Medicare Other | Admitting: Physical Therapy

## 2013-12-21 DIAGNOSIS — R269 Unspecified abnormalities of gait and mobility: Secondary | ICD-10-CM

## 2013-12-21 DIAGNOSIS — Z96649 Presence of unspecified artificial hip joint: Secondary | ICD-10-CM

## 2013-12-21 DIAGNOSIS — M25659 Stiffness of unspecified hip, not elsewhere classified: Secondary | ICD-10-CM

## 2013-12-21 DIAGNOSIS — M25559 Pain in unspecified hip: Secondary | ICD-10-CM

## 2013-12-23 ENCOUNTER — Other Ambulatory Visit: Payer: Self-pay | Admitting: Interventional Cardiology

## 2013-12-24 ENCOUNTER — Other Ambulatory Visit: Payer: Self-pay

## 2013-12-24 ENCOUNTER — Other Ambulatory Visit: Payer: Self-pay | Admitting: Cardiology

## 2013-12-24 ENCOUNTER — Telehealth: Payer: Self-pay

## 2013-12-24 MED ORDER — FUROSEMIDE 40 MG PO TABS: 40.0000 mg | ORAL_TABLET | Freq: Every day | ORAL | Status: DC

## 2013-12-24 MED ORDER — AMIODARONE HCL 400 MG PO TABS: 200.0000 mg | ORAL_TABLET | Freq: Every day | ORAL | Status: DC

## 2013-12-24 MED ORDER — LISINOPRIL 10 MG PO TABS
10.0000 mg | ORAL_TABLET | Freq: Every day | ORAL | Status: DC
Start: 2013-12-24 — End: 2013-12-24

## 2013-12-24 NOTE — Addendum Note (Signed)
Addended byOrlene Plum: Arantxa Piercey H on: 12/24/2013 04:15 PM   Modules accepted: Medications

## 2013-12-24 NOTE — Telephone Encounter (Signed)
Patients insurance will not cover voltaren gel.  I called multiple numbers but there are no alternatives listed.  Please advise.

## 2013-12-24 NOTE — Telephone Encounter (Signed)
He may try some OTC topical rubs/creams if there are no permitted substitution rx'es

## 2013-12-24 NOTE — Telephone Encounter (Signed)
Checked Ecw and it states Lisinopril 20 mg. Epic shows Iisniopril 10 mg. I spoke with pt and he is in fact taking 20 mg of lisinopril ( dosage was put in incorrectly at last OV). Refilled Lisinopril 20 mg as pt has been taking this dosage for quite sometime.

## 2013-12-24 NOTE — Telephone Encounter (Signed)
Should this ne 10mg  or 20mg ? Please advise. Thanks, MI

## 2013-12-25 NOTE — Telephone Encounter (Signed)
Contacted patient to inform him that insurance denied his Voltaren Gel and per Dr. Riley KillSwartz he could try OTC topical rubs/ creams.

## 2013-12-26 ENCOUNTER — Encounter (INDEPENDENT_AMBULATORY_CARE_PROVIDER_SITE_OTHER): Payer: Medicare Other | Admitting: Physical Therapy

## 2013-12-26 DIAGNOSIS — Z96649 Presence of unspecified artificial hip joint: Secondary | ICD-10-CM

## 2013-12-26 DIAGNOSIS — R269 Unspecified abnormalities of gait and mobility: Secondary | ICD-10-CM

## 2013-12-26 DIAGNOSIS — M25559 Pain in unspecified hip: Secondary | ICD-10-CM

## 2013-12-26 DIAGNOSIS — M25659 Stiffness of unspecified hip, not elsewhere classified: Secondary | ICD-10-CM

## 2013-12-28 ENCOUNTER — Encounter: Payer: Medicare Other | Admitting: Physical Therapy

## 2013-12-30 ENCOUNTER — Observation Stay (HOSPITAL_COMMUNITY)
Admission: EM | Admit: 2013-12-30 | Discharge: 2014-01-01 | Disposition: A | Discharge status: Home / Self Care | Payer: Medicare Other | Attending: Orthopedic Surgery | Admitting: Orthopedic Surgery

## 2013-12-30 ENCOUNTER — Encounter (HOSPITAL_COMMUNITY): Payer: Self-pay | Admitting: Emergency Medicine

## 2013-12-30 ENCOUNTER — Emergency Department (HOSPITAL_COMMUNITY): Payer: Medicare Other

## 2013-12-30 DIAGNOSIS — M1610 Unilateral primary osteoarthritis, unspecified hip: Secondary | ICD-10-CM

## 2013-12-30 DIAGNOSIS — M76899 Other specified enthesopathies of unspecified lower limb, excluding foot: Secondary | ICD-10-CM

## 2013-12-30 DIAGNOSIS — I6529 Occlusion and stenosis of unspecified carotid artery: Secondary | ICD-10-CM | POA: Insufficient documentation

## 2013-12-30 DIAGNOSIS — M2170 Unequal limb length (acquired), unspecified site: Secondary | ICD-10-CM

## 2013-12-30 DIAGNOSIS — S32599A Other specified fracture of unspecified pubis, initial encounter for closed fracture: Secondary | ICD-10-CM

## 2013-12-30 DIAGNOSIS — E785 Hyperlipidemia, unspecified: Secondary | ICD-10-CM | POA: Insufficient documentation

## 2013-12-30 DIAGNOSIS — E119 Type 2 diabetes mellitus without complications: Secondary | ICD-10-CM | POA: Insufficient documentation

## 2013-12-30 DIAGNOSIS — M25559 Pain in unspecified hip: Secondary | ICD-10-CM | POA: Insufficient documentation

## 2013-12-30 DIAGNOSIS — S32509A Unspecified fracture of unspecified pubis, initial encounter for closed fracture: Principal | ICD-10-CM | POA: Insufficient documentation

## 2013-12-30 DIAGNOSIS — S329XXA Fracture of unspecified parts of lumbosacral spine and pelvis, initial encounter for closed fracture: Secondary | ICD-10-CM

## 2013-12-30 DIAGNOSIS — Z96649 Presence of unspecified artificial hip joint: Secondary | ICD-10-CM

## 2013-12-30 DIAGNOSIS — Z7982 Long term (current) use of aspirin: Secondary | ICD-10-CM | POA: Insufficient documentation

## 2013-12-30 DIAGNOSIS — Y92009 Unspecified place in unspecified non-institutional (private) residence as the place of occurrence of the external cause: Secondary | ICD-10-CM | POA: Insufficient documentation

## 2013-12-30 DIAGNOSIS — I2510 Atherosclerotic heart disease of native coronary artery without angina pectoris: Secondary | ICD-10-CM | POA: Insufficient documentation

## 2013-12-30 DIAGNOSIS — W07XXXA Fall from chair, initial encounter: Secondary | ICD-10-CM | POA: Insufficient documentation

## 2013-12-30 DIAGNOSIS — Z7901 Long term (current) use of anticoagulants: Secondary | ICD-10-CM | POA: Insufficient documentation

## 2013-12-30 DIAGNOSIS — I739 Peripheral vascular disease, unspecified: Secondary | ICD-10-CM | POA: Insufficient documentation

## 2013-12-30 DIAGNOSIS — Z794 Long term (current) use of insulin: Secondary | ICD-10-CM | POA: Insufficient documentation

## 2013-12-30 DIAGNOSIS — I658 Occlusion and stenosis of other precerebral arteries: Secondary | ICD-10-CM | POA: Insufficient documentation

## 2013-12-30 DIAGNOSIS — Z79899 Other long term (current) drug therapy: Secondary | ICD-10-CM | POA: Insufficient documentation

## 2013-12-30 DIAGNOSIS — I10 Essential (primary) hypertension: Secondary | ICD-10-CM | POA: Insufficient documentation

## 2013-12-30 LAB — CBC WITH DIFFERENTIAL/PLATELET
BASOS ABS: 0 10*3/uL (ref 0.0–0.1)
BASOS PCT: 0 % (ref 0–1)
EOS PCT: 3 % (ref 0–5)
Eosinophils Absolute: 0.2 10*3/uL (ref 0.0–0.7)
HEMATOCRIT: 37.5 % — AB (ref 39.0–52.0)
Hemoglobin: 12.7 g/dL — ABNORMAL LOW (ref 13.0–17.0)
Lymphocytes Relative: 19 % (ref 12–46)
Lymphs Abs: 1.6 10*3/uL (ref 0.7–4.0)
MCH: 30.3 pg (ref 26.0–34.0)
MCHC: 33.9 g/dL (ref 30.0–36.0)
MCV: 89.5 fL (ref 78.0–100.0)
Monocytes Absolute: 0.6 10*3/uL (ref 0.1–1.0)
Monocytes Relative: 7 % (ref 3–12)
NEUTROS ABS: 6.1 10*3/uL (ref 1.7–7.7)
Neutrophils Relative %: 72 % (ref 43–77)
Platelets: 187 10*3/uL (ref 150–400)
RBC: 4.19 MIL/uL — ABNORMAL LOW (ref 4.22–5.81)
RDW: 14.2 % (ref 11.5–15.5)
WBC: 8.5 10*3/uL (ref 4.0–10.5)

## 2013-12-30 LAB — COMPREHENSIVE METABOLIC PANEL
ALBUMIN: 4 g/dL (ref 3.5–5.2)
ALT: 22 U/L (ref 0–53)
AST: 25 U/L (ref 0–37)
Alkaline Phosphatase: 81 U/L (ref 39–117)
BUN: 20 mg/dL (ref 6–23)
CHLORIDE: 99 meq/L (ref 96–112)
CO2: 26 mEq/L (ref 19–32)
Calcium: 9.8 mg/dL (ref 8.4–10.5)
Creatinine, Ser: 0.91 mg/dL (ref 0.50–1.35)
GFR calc Af Amer: >90 mL/min (ref >90)
GFR calc non Af Amer: 84 mL/min — ABNORMAL LOW (ref >90)
Glucose, Bld: 199 mg/dL — ABNORMAL HIGH (ref 70–99)
Potassium: 4.4 mEq/L (ref 3.7–5.3)
Sodium: 140 mEq/L (ref 137–147)
TOTAL PROTEIN: 8 g/dL (ref 6.0–8.3)
Total Bilirubin: 1.1 mg/dL (ref 0.3–1.2)

## 2013-12-30 LAB — URINE MICROSCOPIC-ADD ON

## 2013-12-30 LAB — URINALYSIS, ROUTINE W REFLEX MICROSCOPIC
GLUCOSE, UA: 250 mg/dL — AB
KETONES UR: NEGATIVE mg/dL
LEUKOCYTES UA: NEGATIVE
Nitrite: NEGATIVE
PROTEIN: NEGATIVE mg/dL
Specific Gravity, Urine: 1.028 (ref 1.005–1.030)
Urobilinogen, UA: 1 mg/dL (ref 0.0–1.0)
pH: 5.5 (ref 5.0–8.0)

## 2013-12-30 MED ORDER — AMIODARONE HCL 200 MG PO TABS
200.0000 mg | ORAL_TABLET | Freq: Every day | ORAL | Status: DC
  Administered 2013-12-31 – 2014-01-01 (×2): 200 mg via ORAL
  Filled 2013-12-30 (×2): qty 1

## 2013-12-30 MED ORDER — SODIUM CHLORIDE 0.9 % IJ SOLN
3.0000 mL | Freq: Two times a day (BID) | INTRAMUSCULAR | Status: DC
  Administered 2013-12-30 – 2014-01-01 (×2): 3 mL via INTRAVENOUS

## 2013-12-30 MED ORDER — SODIUM CHLORIDE 0.9 % IJ SOLN: 3.0000 mL | INTRAMUSCULAR | Status: DC | PRN

## 2013-12-30 MED ORDER — CEPHALEXIN 250 MG PO CAPS: 250.0000 mg | ORAL_CAPSULE | Freq: Once | ORAL | Status: DC

## 2013-12-30 MED ORDER — INSULIN ASPART 100 UNIT/ML ~~LOC~~ SOLN
20.0000 [IU] | Freq: Every day | SUBCUTANEOUS | Status: DC
  Administered 2013-12-31 – 2014-01-01 (×2): 20 [IU] via SUBCUTANEOUS

## 2013-12-30 MED ORDER — METOPROLOL SUCCINATE ER 25 MG PO TB24
25.0000 mg | ORAL_TABLET | Freq: Every day | ORAL | Status: DC
  Administered 2013-12-31 – 2014-01-01 (×2): 25 mg via ORAL
  Filled 2013-12-30 (×2): qty 1

## 2013-12-30 MED ORDER — ACETAMINOPHEN 325 MG PO TABS
ORAL_TABLET | ORAL | Status: AC
  Filled 2013-12-30: qty 2

## 2013-12-30 MED ORDER — ACETAMINOPHEN 325 MG PO TABS
650.0000 mg | ORAL_TABLET | ORAL | Status: DC | PRN
  Administered 2013-12-30: 650 mg via ORAL

## 2013-12-30 MED ORDER — CEPHALEXIN 500 MG PO CAPS
500.0000 mg | ORAL_CAPSULE | Freq: Once | ORAL | Status: AC
  Administered 2013-12-30: 500 mg via ORAL
  Filled 2013-12-30: qty 1

## 2013-12-30 MED ORDER — OXYCODONE HCL 5 MG PO TABS
20.0000 mg | ORAL_TABLET | Freq: Four times a day (QID) | ORAL | Status: DC | PRN
  Administered 2013-12-30 – 2014-01-01 (×7): 20 mg via ORAL
  Filled 2013-12-30 (×7): qty 4

## 2013-12-30 MED ORDER — INSULIN GLARGINE 100 UNIT/ML ~~LOC~~ SOLN
70.0000 [IU] | Freq: Every morning | SUBCUTANEOUS | Status: DC
  Administered 2013-12-31: 70 [IU] via SUBCUTANEOUS
  Filled 2013-12-30 (×3): qty 0.7

## 2013-12-30 MED ORDER — GLIPIZIDE 10 MG PO TABS
10.0000 mg | ORAL_TABLET | Freq: Every day | ORAL | Status: DC
  Administered 2013-12-31 – 2014-01-01 (×2): 10 mg via ORAL
  Filled 2013-12-30 (×3): qty 1

## 2013-12-30 MED ORDER — ASPIRIN EC 81 MG PO TBEC
81.0000 mg | DELAYED_RELEASE_TABLET | Freq: Every day | ORAL | Status: DC
  Filled 2013-12-30: qty 1

## 2013-12-30 MED ORDER — LISINOPRIL 40 MG PO TABS
40.0000 mg | ORAL_TABLET | Freq: Every day | ORAL | Status: DC
  Administered 2013-12-31 – 2014-01-01 (×2): 40 mg via ORAL
  Filled 2013-12-30 (×2): qty 1

## 2013-12-30 MED ORDER — NITROGLYCERIN 0.4 MG SL SUBL: 0.4000 mg | SUBLINGUAL_TABLET | SUBLINGUAL | Status: DC | PRN

## 2013-12-30 MED ORDER — SODIUM CHLORIDE 0.9 % IV SOLN
250.0000 mL | INTRAVENOUS | Status: DC | PRN
  Administered 2013-12-30: 20:00:00 via INTRAVENOUS

## 2013-12-30 MED ORDER — ISOSORBIDE MONONITRATE ER 30 MG PO TB24
30.0000 mg | ORAL_TABLET | Freq: Every morning | ORAL | Status: DC
  Administered 2013-12-31 – 2014-01-01 (×2): 30 mg via ORAL
  Filled 2013-12-30 (×2): qty 1

## 2013-12-30 MED ORDER — SACCHAROMYCES BOULARDII 250 MG PO CAPS
250.0000 mg | ORAL_CAPSULE | Freq: Every day | ORAL | Status: DC
  Administered 2013-12-31 – 2014-01-01 (×2): 250 mg via ORAL
  Filled 2013-12-30 (×2): qty 1

## 2013-12-30 MED ORDER — FUROSEMIDE 40 MG PO TABS
40.0000 mg | ORAL_TABLET | Freq: Every day | ORAL | Status: DC
  Administered 2013-12-31 – 2014-01-01 (×2): 40 mg via ORAL
  Filled 2013-12-30 (×3): qty 1

## 2013-12-30 MED ORDER — MORPHINE SULFATE ER 30 MG PO TBCR
30.0000 mg | EXTENDED_RELEASE_TABLET | Freq: Two times a day (BID) | ORAL | Status: DC
  Administered 2013-12-30 – 2014-01-01 (×4): 30 mg via ORAL
  Filled 2013-12-30 (×4): qty 1

## 2013-12-30 MED ORDER — INSULIN ASPART 100 UNIT/ML ~~LOC~~ SOLN
0.0000 [IU] | SUBCUTANEOUS | Status: DC
  Administered 2013-12-31: 5 [IU] via SUBCUTANEOUS

## 2013-12-30 MED ORDER — GABAPENTIN 300 MG PO CAPS
300.0000 mg | ORAL_CAPSULE | Freq: Three times a day (TID) | ORAL | Status: DC
  Administered 2013-12-31 – 2014-01-01 (×5): 300 mg via ORAL
  Filled 2013-12-30 (×9): qty 1

## 2013-12-30 MED ORDER — RIVAROXABAN 20 MG PO TABS
20.0000 mg | ORAL_TABLET | Freq: Every day | ORAL | Status: DC
  Administered 2013-12-31: 20 mg via ORAL
  Filled 2013-12-30 (×2): qty 1

## 2013-12-30 MED ORDER — HYDROMORPHONE HCL PF 1 MG/ML IJ SOLN
1.0000 mg | Freq: Once | INTRAMUSCULAR | Status: AC
  Administered 2013-12-30: 1 mg via INTRAVENOUS
  Filled 2013-12-30: qty 1

## 2013-12-30 MED ORDER — METFORMIN HCL 500 MG PO TABS
1000.0000 mg | ORAL_TABLET | Freq: Every day | ORAL | Status: DC
  Administered 2013-12-31 – 2014-01-01 (×2): 1000 mg via ORAL
  Filled 2013-12-30 (×3): qty 2

## 2013-12-30 NOTE — ED Notes (Signed)
Pt given urinal and aware of the need of urine

## 2013-12-30 NOTE — Progress Notes (Signed)
Spoke with [place] in lab who assures me culture is being done on Urine which was collected in ED.

## 2013-12-30 NOTE — Progress Notes (Signed)
Called wife to verify that pt had BP meds this am.

## 2013-12-30 NOTE — Progress Notes (Signed)
Patient is stable, A/O, and vital signs were taken

## 2013-12-30 NOTE — Progress Notes (Signed)
Pt T 103.0, no prn Tylenol orders on record. Will call MD on call Jeanella Craze(PA-c Owens). Also c/o "spasms" No Robaxin on file.

## 2013-12-30 NOTE — ED Provider Notes (Signed)
CSN: [id number]     Arrival date & time 12/30/13  1420 History   First MD Initiated Contact with Patient 12/30/13 1454     Chief Complaint  Patient presents with  . Fall  . Hip Pain  . Fever     (Consider location/radiation/quality/duration/timing/severity/associated sxs/prior Treatment) HPI 73 y.o. Male with chronic righ thip pain who fell from a chair yesterday landing on his right hip.  He states his hip pain is severe and went to [facility] ED yesterday and had x-rays and told normal.  Patient on chronic pain meds of ms 60 mg/day and short acting oxycodone.   Past Medical History  Diagnosis Date  . Diabetes mellitus   . CAD (coronary artery disease)   . Hypertension   . Claudication in peripheral vascular disease 07/08/2011    Left external iliac occlusion with collaterals; mild-moderate right external iliac disease; moderate calcified left popliteal disease  . Carotid artery disease 2012    < 50% stenosis bilaterally  . Hyperlipidemia   . AAA (abdominal aortic aneurysm)   . Chronic kidney disease     hx kidney stones  . Arthritis   . 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 Acute diastolic heart failure   . Shortness of breath    Past Surgical History  Procedure Laterality Date  . Shoulder surgery      right  . Carpal tunnel release  2006    right   . Hip fracture surgery  11/2007    right  . Joint replacement  2009    right total hip replacement  . Coronary angioplasty with stent placement  05/30/2007    Mid RCA 3.0 x 15 mm vision BMS, distal RCA 2.5 x 12 mm vision BMS, PDA 2.0 x 12 mm vision BMS  . Tonsillectomy      as child  . Total hip revision Right 09/03/2013    Procedure: RIGHT TOTAL HIP REVISION;  Surgeon: Shelda Pal, MD;  Location: [facility]S;  Service: Orthopedics;  Laterality: Right;  . Atrial flutter ablation  11/03/2006  . Coronary angioplasty with stent placement  10/06/2007    LM 25, LAD 25, D1 50, D2 50, CFX mild dz,  OM1 diffuse dz, OM 2 100 w/ L-L collaterals, mid-RCA stent with mild ISR, distal RCA stent with > 90 ISR, subtotal PL, Rx w/ 2.5 x 15-mm Promus DES. EF 55%,    Family History  Problem Relation Age of Onset  . Emphysema Mother   . Cancer Father     Brain  . Diabetes Son    History  Substance Use Topics  . Smoking status: Former Smoker    Types: Cigarettes    Quit date: 10/11/2005  . Smokeless tobacco: Never Used  . Alcohol Use: Yes     Comment: occasionally    Review of Systems    Allergies  Nsaids  Home Medications   Current Outpatient Rx  Name  Route  Sig  Dispense  Refill  . amiodarone (PACERONE) 400 MG tablet   Oral   Take 0.5 tablets (200 mg total) by mouth daily.   45 tablet   3   . aspirin EC 81 MG tablet   Oral   Take 81 mg by mouth daily.          . fenofibrate micronized (LOFIBRA) 134 MG capsule   Oral   Take 134 mg by mouth daily before breakfast.          .  fish oil-omega-3 fatty acids 1000 MG capsule   Oral   Take 1 g by mouth daily.         . furosemide (LASIX) 40 MG tablet   Oral   Take 1 tablet (40 mg total) by mouth daily.   30 tablet   6   . gabapentin (NEURONTIN) 300 MG capsule   Oral   Take 300 mg by mouth 3 (three) times daily. 1 am, 1 lunch, 1 bedtime         . glipiZIDE (GLUCOTROL) 10 MG tablet   Oral   Take 10 mg by mouth daily.          Marland Kitchen HUMALOG KWIKPEN 100 UNIT/ML SOPN   Injection   Inject 20 Units as directed every morning.          . insulin glargine (LANTUS) 100 UNIT/ML injection   Subcutaneous   Inject 70 Units into the skin every morning.          . isosorbide mononitrate (IMDUR) 30 MG 24 hr tablet   Oral   Take 30 mg by mouth every morning.          Marland Kitchen lisinopril (PRINIVIL,ZESTRIL) 40 MG tablet   Oral   Take 40 mg by mouth daily.         . metFORMIN (GLUCOPHAGE) 500 MG tablet   Oral   Take 1,000 mg by mouth daily.          . metoprolol succinate (TOPROL-XL) 25 MG 24 hr tablet   Oral    Take 25 mg by mouth daily.          Marland Kitchen morphine (MS CONTIN) 30 MG 12 hr tablet   Oral   Take 1 tablet (30 mg total) by mouth every 12 (twelve) hours.   60 tablet   0     Must last 30 days   . Oxycodone HCl 20 MG TABS   Oral   Take 1 tablet (20 mg total) by mouth every 6 (six) hours as needed.   120 tablet   0     Must last 30 days   . Rivaroxaban (XARELTO) 20 MG TABS tablet   Oral   Take 1 tablet (20 mg total) by mouth daily with supper.   30 tablet   3   . rosuvastatin (CRESTOR) 10 MG tablet   Oral   Take 10 mg by mouth every morning.          . saccharomyces boulardii (FLORASTOR) 250 MG capsule   Oral   Take 250 mg by mouth daily.         Marland Kitchen zolpidem (AMBIEN) 10 MG tablet   Oral   Take 10 mg by mouth at bedtime as needed for sleep.          . nitroGLYCERIN (NITROSTAT) 0.4 MG SL tablet   Sublingual   Place 0.4 mg under the tongue every 5 (five) minutes as needed.           BP 118/56  Pulse 65  Temp(Src) 98 F (36.7 C) (Oral)  Resp 18  SpO2 97% Physical Exam  Nursing note and vitals reviewed. Constitutional: He is oriented to person, place, and time. He appears well-developed and well-nourished.  HENT:  Head: Normocephalic and atraumatic.  Right Ear: External ear normal.  Left Ear: External ear normal.  Nose: Nose normal.  Mouth/Throat: Oropharynx is clear and moist.  Eyes: Conjunctivae and EOM are normal. Pupils are equal, round, and reactive  to light.  Neck: Normal range of motion. Neck supple.  Cardiovascular: Normal rate, regular rhythm, normal heart sounds and intact distal pulses.   Pulmonary/Chest: Effort normal and breath sounds normal. No respiratory distress. He has no wheezes. He exhibits no tenderness.  Abdominal: Soft. Bowel sounds are normal. He exhibits no distension and no mass. There is no tenderness. There is no guarding.  Musculoskeletal:  Right hip ttp diffusely laterally from hip to mid thigh no deformity noted, patient will  flex partially but will not internally or externally rotate.  Knee norma, ankle normal.  Neurovascularly intact rle.  Neurological: He is alert and oriented to person, place, and time. He has normal reflexes. He exhibits normal muscle tone. Coordination normal.  Skin: Skin is warm and dry.  Psychiatric: He has a normal mood and affect. His behavior is normal. Judgment and thought content normal.    ED Course  Procedures (including critical care time) Labs Review Labs Reviewed  URINALYSIS, ROUTINE W REFLEX MICROSCOPIC  CBC WITH DIFFERENTIAL  COMPREHENSIVE METABOLIC PANEL   Imaging Review Dg Chest 2 View  12/30/2013   CLINICAL DATA:  Fever, recent falls, history diabetes, hypertension, coronary artery disease  EXAM: CHEST  2 VIEW  COMPARISON:  09/16/2013  FINDINGS: Mild enlargement of cardiac silhouette.  Mediastinal contours and pulmonary vascularity normal.  Lungs clear.  No pleural effusion or pneumothorax.  Bones demineralized.  IMPRESSION: Mild enlargement of cardiac silhouette.  No acute abnormalities.   Electronically Signed   By: Ulyses SouthwardMark  Boles M.D.   On: 12/30/2013 16:43   Ct Pelvis Wo Contrast  12/30/2013   CLINICAL DATA:  Right hip pain  EXAM: CT PELVIS WITHOUT CONTRAST  TECHNIQUE: Multidetector CT imaging of the pelvis was performed following the standard protocol without intravenous contrast.  COMPARISON:  DG PELVIS PORTABLE dated 09/16/2013; CT ABD/PELV WO CM dated 05/11/2010  FINDINGS: There is a right total hip arthroplasty. There is significant beam hardening artifact resulting from the arthroplasty obscuring the adjacent soft tissue and osseous structures which limits evaluation. There is a cerclage wire transfixing a healed proximal right femoral diaphyseal fracture.  There is a nondisplaced fracture at the junction of the anterior acetabulum and right superior pubic ramus. There is a nondisplaced fracture of the right inferior pubic ramus.  There is no left hip fracture or dislocation.   Stable lytic lesion in the right posterior ilium measuring 2.6 cm with well-defined margins and without cortical disruption likely representing a benign fibro-osseous lesion. There is no other lytic or sclerotic osseous lesion. The SI joints are unremarkable.  There is degenerative facet arthropathy at L5-S1. There is mild degenerative disc disease with a mild broad-based disc bulge at L5-S1.  There is peripheral vascular atherosclerotic disease. There is a normal caliber appendix in the right lower quadrant without periappendiceal inflammatory changes.  There is no focal fluid collection or hematoma.  IMPRESSION: 1. Nondisplaced fracture at the junction of the right anterior acetabulum and right superior pubic ramus. 2. Nondisplaced fracture of the right inferior pubic ramus.   Electronically Signed   By: Elige KoHetal  Patel   On: 12/30/2013 16:55  I have reviewed the report and personally reviewed the above radiology studies.     EKG Interpretation None      MDM   Final diagnoses:  Pubic ramus fracture    Discussed with Dr. Fontaine NoBrookes and he will see and evaluate.     Hilario Quarryanielle S Nancylee Gaines, MD 12/30/13 [phone]

## 2013-12-30 NOTE — H&P (Signed)
Thomas Canterbury, MD Chief Complaint: Right hip pain History: 73 y.o. Male with chronic righ thip pain who fell from a chair yesterday landing on his right hip. He states his hip pain is severe and went to [facility] ED yesterday and had x-rays and told normal. Patient on chronic pain meds of ms 60 mg/day and short acting oxycodone.   Past Medical History  Diagnosis Date  . Diabetes mellitus   . CAD (coronary artery disease)   . Hypertension   . Claudication in peripheral vascular disease 07/08/2011    Left external iliac occlusion with collaterals; mild-moderate right external iliac disease; moderate calcified left popliteal disease  . Carotid artery disease 2012    < 50% stenosis bilaterally  . Hyperlipidemia   . AAA (abdominal aortic aneurysm)   . Chronic kidney disease     hx kidney stones  . Arthritis   . 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 Acute diastolic heart failure   . Shortness of breath     Allergies  Allergen Reactions  . Nsaids Diarrhea    No current facility-administered medications on file prior to encounter.   Current Outpatient Prescriptions on File Prior to Encounter  Medication Sig Dispense Refill  . amiodarone (PACERONE) 400 MG tablet Take 0.5 tablets (200 mg total) by mouth daily.  45 tablet  3  . aspirin EC 81 MG tablet Take 81 mg by mouth daily.       . fenofibrate micronized (LOFIBRA) 134 MG capsule Take 134 mg by mouth daily before breakfast.       . fish oil-omega-3 fatty acids 1000 MG capsule Take 1 g by mouth daily.      . furosemide (LASIX) 40 MG tablet Take 1 tablet (40 mg total) by mouth daily.  30 tablet  6  . gabapentin (NEURONTIN) 300 MG capsule Take 300 mg by mouth 3 (three) times daily. 1 am, 1 lunch, 1 bedtime      . glipiZIDE (GLUCOTROL) 10 MG tablet Take 10 mg by mouth daily.       Marland Kitchen HUMALOG KWIKPEN 100 UNIT/ML SOPN Inject 20 Units as directed every morning.       . insulin glargine (LANTUS) 100 UNIT/ML  injection Inject 70 Units into the skin every morning.       . isosorbide mononitrate (IMDUR) 30 MG 24 hr tablet Take 30 mg by mouth every morning.       . metFORMIN (GLUCOPHAGE) 500 MG tablet Take 1,000 mg by mouth daily.       . metoprolol succinate (TOPROL-XL) 25 MG 24 hr tablet Take 25 mg by mouth daily.       Marland Kitchen morphine (MS CONTIN) 30 MG 12 hr tablet Take 1 tablet (30 mg total) by mouth every 12 (twelve) hours.  60 tablet  0  . Oxycodone HCl 20 MG TABS Take 1 tablet (20 mg total) by mouth every 6 (six) hours as needed.  120 tablet  0  . Rivaroxaban (XARELTO) 20 MG TABS tablet Take 1 tablet (20 mg total) by mouth daily with supper.  30 tablet  3  . rosuvastatin (CRESTOR) 10 MG tablet Take 10 mg by mouth every morning.       . saccharomyces boulardii (FLORASTOR) 250 MG capsule Take 250 mg by mouth daily.      Marland Kitchen zolpidem (AMBIEN) 10 MG tablet Take 10 mg by mouth at bedtime as needed for sleep.       . nitroGLYCERIN (  NITROSTAT) 0.4 MG SL tablet Place 0.4 mg under the tongue every 5 (five) minutes as needed.         Physical Exam: Filed Vitals:   12/30/13 1429  BP: 118/56  Pulse: 65  Temp: 98 F (36.7 C)  Resp: 18  A+O X 3 NVI Comparments soft/NT Right groin and lateral hip pain with palpation No deformity No abrasion/laceration No SOB/CP EHL/TA/GA intact   Image: Dg Chest 2 View  12/30/2013   CLINICAL DATA:  Fever, recent falls, history diabetes, hypertension, coronary artery disease  EXAM: CHEST  2 VIEW  COMPARISON:  09/16/2013  FINDINGS: Mild enlargement of cardiac silhouette.  Mediastinal contours and pulmonary vascularity normal.  Lungs clear.  No pleural effusion or pneumothorax.  Bones demineralized.  IMPRESSION: Mild enlargement of cardiac silhouette.  No acute abnormalities.   Electronically Signed   By: Ulyses SouthwardMark  Boles M.D.   On: 12/30/2013 16:43   Ct Pelvis Wo Contrast  12/30/2013   CLINICAL DATA:  Right hip pain  EXAM: CT PELVIS WITHOUT CONTRAST  TECHNIQUE: Multidetector  CT imaging of the pelvis was performed following the standard protocol without intravenous contrast.  COMPARISON:  DG PELVIS PORTABLE dated 09/16/2013; CT ABD/PELV WO CM dated 05/11/2010  FINDINGS: There is a right total hip arthroplasty. There is significant beam hardening artifact resulting from the arthroplasty obscuring the adjacent soft tissue and osseous structures which limits evaluation. There is a cerclage wire transfixing a healed proximal right femoral diaphyseal fracture.  There is a nondisplaced fracture at the junction of the anterior acetabulum and right superior pubic ramus. There is a nondisplaced fracture of the right inferior pubic ramus.  There is no left hip fracture or dislocation.  Stable lytic lesion in the right posterior ilium measuring 2.6 cm with well-defined margins and without cortical disruption likely representing a benign fibro-osseous lesion. There is no other lytic or sclerotic osseous lesion. The SI joints are unremarkable.  There is degenerative facet arthropathy at L5-S1. There is mild degenerative disc disease with a mild broad-based disc bulge at L5-S1.  There is peripheral vascular atherosclerotic disease. There is a normal caliber appendix in the right lower quadrant without periappendiceal inflammatory changes.  There is no focal fluid collection or hematoma.  IMPRESSION: 1. Nondisplaced fracture at the junction of the right anterior acetabulum and right superior pubic ramus. 2. Nondisplaced fracture of the right inferior pubic ramus.   Electronically Signed   By: Elige KoHetal  Patel   On: 12/30/2013 16:55    A/P:  Patient with chronic pain in right hip 1 day s/p fallat home. [facility]. [facility] with ongoing pain when attempting to ambulate CT scan demonstrated non-displaced rami fracture. No displacement of the THR. Plan: admit for pain control  PT/OT eval in AM - WBAT  Will have Dr Charlann Boxerlin eval in AM

## 2013-12-30 NOTE — ED Notes (Signed)
He states he fell two days ago while standing on a kitchen chair--landed on right side.  He c/o right hip pain ever since.  [facility] [place] [facility], and they performed x-ray of hip, which was "negative".  He states he has had right total hip surg. By Dr. Charlann Boxerlin.  His wife expresses concern from on-going hip pain; also, he had a fever with some delerium last night.  Currently, he is oriented x 4.

## 2013-12-31 LAB — GLUCOSE, CAPILLARY
GLUCOSE-CAPILLARY: 103 mg/dL — AB (ref 70–99)
GLUCOSE-CAPILLARY: 209 mg/dL — AB (ref 70–99)
GLUCOSE-CAPILLARY: 76 mg/dL (ref 70–99)
Glucose-Capillary: 137 mg/dL — ABNORMAL HIGH (ref 70–99)
Glucose-Capillary: 79 mg/dL (ref 70–99)
Glucose-Capillary: 98 mg/dL (ref 70–99)

## 2013-12-31 LAB — URINE CULTURE

## 2013-12-31 LAB — HEMOGLOBIN A1C
Hgb A1c MFr Bld: 7.1 % — ABNORMAL HIGH (ref <5.7)
MEAN PLASMA GLUCOSE: 157 mg/dL — AB (ref <117)

## 2013-12-31 MED ORDER — ZOLPIDEM TARTRATE 5 MG PO TABS
5.0000 mg | ORAL_TABLET | Freq: Every evening | ORAL | Status: DC | PRN
  Administered 2013-12-31: 5 mg via ORAL
  Filled 2013-12-31: qty 1

## 2013-12-31 NOTE — Evaluation (Signed)
Physical Therapy Evaluation Patient Details Name: Thomas [name] MRN: [id number] DOB: 12-20-1940 Today's Date: 12/31/2013 Time: 0940-1001 PT Time Calculation (min): 21 min  PT Assessment / Plan / Recommendation History of Present Illness  Pt admitted for R hip pain after a fall off of a chair. Pt sustained  R nondisplaced  anterior acetabular and  nondisplaced R inferior pubic rami fxs  Clinical Impression  Pt limited with pain in R hip area, does not tolerated weight. Pt will benefit from PT to address problems listed. Pt will benefit from ST rehab at DC as pt  Is not mobile at present.    PT Assessment  Patient needs continued PT services    Follow Up Recommendations  SNF    Does the patient have the potential to tolerate intense rehabilitation      Barriers to Discharge        Equipment Recommendations  None recommended by PT    Recommendations for Other Services     Frequency Min 3X/week    Precautions / Restrictions Precautions Precautions: Fall Restrictions Weight Bearing Restrictions: No RLE Weight Bearing: Weight bearing as tolerated   Pertinent Vitals/Pain "10'", was medicated prior to treatment.     Mobility  Bed Mobility Overal bed mobility: Needs Assistance Bed Mobility: Sit to Supine Sit to supine: Mod assist General bed mobility comments: assist for getting both legs onto bed. Transfers Overall transfer level: Modified independent Equipment used: Rolling walker (2 wheeled) Transfers: Sit to/from Stand Sit to Stand: Mod assist General transfer comment: cues for UE use. R leg position Ambulation/Gait Ambulation/Gait assistance: Mod assist Ambulation Distance (Feet): 8 Feet Assistive device: Rolling walker (2 wheeled) Gait Pattern/deviations: Step-to pattern;Antalgic;Decreased stance time - right Gait velocity: slow, stops for rest General Gait Details: R leg externally rotated but able to correct partially with efforts.    Exercises     PT  Diagnosis: Difficulty walking;Acute pain  PT Problem List: Decreased strength;Decreased range of motion;Decreased activity tolerance;Decreased mobility;Decreased knowledge of precautions;Pain;Decreased knowledge of use of DME;Decreased safety awareness PT Treatment Interventions: DME instruction;Gait training;Functional mobility training;Therapeutic activities;Therapeutic exercise;Patient/family education     PT Goals(Current goals can be found in the care plan section) Acute Rehab PT Goals Patient Stated Goal: I want to walk without pain. PT Goal Formulation: With patient Time For Goal Achievement: 01/14/14 Potential to Achieve Goals: Good  Visit Information  Last PT Received On: 12/31/13 Assistance Needed: +1 History of Present Illness: Pt admitted for R hip pain after a fall off of a chair. Pt sustained  R nondisplaced  anterior acetabular and  nondisplaced R inferior pubic rami fxs       Prior Functioning  Home Living Family/patient expects to be discharged to:: Private residence Living Arrangements: Spouse/significant other Available Help at Discharge: Family;Available PRN/intermittently Type of Home: House Home Access: Stairs to enter Entrance Stairs-Number of Steps: 3 Home Layout: One level Home Equipment: Walker - 2 wheels;Wheelchair - manual Prior Function Level of Independence: Independent with assistive device(s) Communication Communication: No difficulties    Cognition  Cognition Arousal/Alertness: Awake/alert Behavior During Therapy: WFL for tasks assessed/performed Overall Cognitive Status: Within Functional Limits for tasks assessed    Extremity/Trunk Assessment Upper Extremity Assessment Upper Extremity Assessment: Overall WFL for tasks assessed Lower Extremity Assessment Lower Extremity Assessment: RLE deficits/detail RLE Deficits / Details: R leg tends to externally rotate in stance and swing, pt is able to actively rotate  toward neutral but not fully.    Balance    End of Session  PT - End of Session Equipment Utilized During Treatment: Gait belt Activity Tolerance: Patient limited by pain;Patient limited by fatigue Patient left: in bed;with call bell/phone within reach Nurse Communication: Mobility status  GP Functional Assessment Tool Used: clinical judgement Functional Limitation: Mobility: Walking and moving around Mobility: Walking and Moving Around Current Status (Z6109): At least 40 percent but less than 60 percent impaired, limited or restricted Mobility: Walking and Moving Around Goal Status [phone]): At least 1 percent but less than 20 percent impaired, limited or restricted   Rada Hay 12/31/2013, 11:36 [id number]

## 2013-12-31 NOTE — Progress Notes (Signed)
Dimitri PedAmber [place]stable, [place] called back to discuss plan of care.  Amber spoke with Dr Charlann Boxerlin. No change in plan of care for now, Dr Charlann Boxerlin to evaluate patient in the am.  No new orders given at this time.  Notified Patient and spouse.  No concerns or questions verbalized at this time.  Patient medicated for pain, bed alarm set, call light within reach.  Expressed to patient and spouse plan of care was for PT/OT-which did work with patient and pain control.  Patient verbalized understanding.

## 2013-12-31 NOTE — Progress Notes (Signed)
UR completed 

## 2013-12-31 NOTE — Progress Notes (Signed)
Discussed case with night RN as well as with Dr. Charlann Boxerlin. Patient has a nondisplaced pubic ramus fracture with a history of a right total hip arthroplasty with revision. No periprosthetic fractures noted. Patient will continue WBAT. Conservative treatment at this time. Dr. Charlann Boxerlin will see the patient tomorrow to discussed the treatment in detail.   Dimitri PedAmber Denym Rahimi, PA-C

## 2013-12-31 NOTE — Progress Notes (Signed)
Profuse sweating throughout night long after T reduced from 103.0 Orally to 99 Orally.

## 2013-12-31 NOTE — Progress Notes (Signed)
During patient rounds, and assessment, noted pt not seen by Md today.  Patient admitted after fall for pain management, PT/OT eval. PT worked with patient today and patient did not tolerate well.  Pain has been controlled. Paged Thomas [name], [place].  Awaiting responseDimitri [name]. [address]2010- Thomas [place] For[place], [place] notifed of above and will follow-up with Thomas [name].  Order given for ambien PO.  Notified patient and spouse concerning plan of care. Patient verbalizes understanding.

## 2014-01-01 LAB — GLUCOSE, CAPILLARY
GLUCOSE-CAPILLARY: 83 mg/dL (ref 70–99)
GLUCOSE-CAPILLARY: 75 mg/dL (ref 70–99)
Glucose-Capillary: 104 mg/dL — ABNORMAL HIGH (ref 70–99)
Glucose-Capillary: 106 mg/dL — ABNORMAL HIGH (ref 70–99)

## 2014-01-01 MED ORDER — ASPIRIN EC 81 MG PO TBEC: 81.0000 mg | DELAYED_RELEASE_TABLET | Freq: Every day | ORAL | Status: AC

## 2014-01-01 MED ORDER — ROSUVASTATIN CALCIUM 10 MG PO TABS: 10.0000 mg | ORAL_TABLET | Freq: Every morning | ORAL | Status: DC

## 2014-01-01 MED ORDER — ACETAMINOPHEN 325 MG PO TABS: 650.0000 mg | ORAL_TABLET | ORAL | Status: AC | PRN

## 2014-01-01 MED ORDER — FENOFIBRATE MICRONIZED 134 MG PO CAPS: 134.0000 mg | ORAL_CAPSULE | Freq: Every day | ORAL | Status: AC

## 2014-01-01 MED ORDER — ZOLPIDEM TARTRATE 10 MG PO TABS: 10.0000 mg | ORAL_TABLET | Freq: Every evening | ORAL | Status: DC | PRN

## 2014-01-01 MED ORDER — OMEGA-3 FATTY ACIDS 1000 MG PO CAPS: 1.0000 g | ORAL_CAPSULE | Freq: Every day | ORAL | Status: DC

## 2014-01-01 MED ORDER — OXYCODONE HCL 10 MG PO TABS: 20.0000 mg | ORAL_TABLET | Freq: Four times a day (QID) | ORAL | Status: DC | PRN

## 2014-01-01 NOTE — Progress Notes (Signed)
Patient ID: Thomas [name], male   DOB: 1940/12/07, 73 y.o.   MRN: [id number] Subjective: In pain and "pain meds here not doing much"  Worked with PT yesterday with effort    Patient reports pain as moderate to severe.  Has been on chronic pain meds for sometime making it difficult to adjust to any additional new insults Objective:   VITALS:   Filed Vitals:   01/01/14 0543  BP: 112/53  Pulse: 62  Temp: 99.3 F (37.4 C)  Resp: 16    Neurovascular intact  LABS  Recent Labs  12/30/13 1527  HGB 12.7*  HCT 37.5*  WBC 8.5  PLT 187     Recent Labs  12/30/13 1527  NA 140  K 4.4  BUN 20  CREATININE 0.91  GLUCOSE 199*    No results found for this basename: LABPT, INR,  in the last 72 hours  CLINICAL DATA: Right hip pain  EXAM:  CT PELVIS WITHOUT CONTRAST  TECHNIQUE:  Multidetector CT imaging of the pelvis was performed following the  standard protocol without intravenous contrast.  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 There is significant beam  hardening artifact resulting from the arthroplasty obscuring the  adjacent soft tissue and osseous structures which limits evaluation.  There is a cerclage wire transfixing a healed proximal right femoral  diaphyseal fracture.  There is a nondisplaced fracture at the junction of the anterior  acetabulum and right superior pubic ramus. There is a nondisplaced  fracture of the right inferior pubic ramus.  There is no left hip fracture or dislocation.  Stable lytic lesion in the right posterior ilium measuring 2.6 cm  with well-defined margins and without cortical disruption likely  representing a benign fibro-osseous lesion. There is no other lytic  or sclerotic osseous lesion. The SI joints are unremarkable.  There is degenerative facet arthropathy at L5-S1. There is mild  degenerative disc disease with a mild broad-based disc  bulge at  L5-S1.  There is peripheral vascular atherosclerotic disease. There is a  normal caliber appendix in the right lower quadrant without  periappendiceal inflammatory changes.  There is no focal fluid collection or hematoma.  IMPRESSION:  1. Nondisplaced fracture at the junction of the right anterior  acetabulum and right superior pubic ramus.  2. Nondisplaced fracture of the right inferior pubic ramus.  Electronically Signed  By: Elige KoHetal Patel  On: 12/30/2013 16:55    Assessment/Plan:     Non-displaced fractures involving the right pubic rami No significant clinical effect on right total hip components   Up with therapy Discharge home with home health today with HHPT, Thomas [name]

## 2014-01-01 NOTE — Discharge Summary (Signed)
Physician Discharge Summary  Patient ID: Thomas [name] MRN: [id number] DOB/AGE: 71-30-1942 71 y.o.  Admit date: 12/30/2013 Discharge date: 01/01/2014   Procedures:  None  Attending Physician:  Dr. Durene RomansMatthew Olin   Admission Diagnoses:   Right hip pain  Discharge Diagnoses:  Active Problems:   Pelvic fracture  Past Medical History  Diagnosis Date  . Diabetes mellitus   . CAD (coronary artery disease)   . Hypertension   . Claudication in peripheral vascular disease 07/08/2011    Left external iliac occlusion with collaterals; mild-moderate right external iliac disease; moderate calcified left popliteal disease  . Carotid artery disease 2012    < 50% stenosis bilaterally  . Hyperlipidemia   . AAA (abdominal aortic aneurysm)   . Chronic kidney disease     hx kidney stones  . Arthritis   . 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 Acute diastolic heart failure   . Shortness of breath     HPI: 73 y.o. Male with chronic righ thip pain who fell from a chair yesterday landing on his right hip. He states his hip pain is severe and [facility] and [facility]-rays and told normal. Patient on chronic pain meds of ms 60 mg/day and short acting oxycodone.   PCP: Janece CanterburyBOALS,AARON, MD   Discharged Condition: good  Hospital Course:  Patient presented to the ER on 12/30/2013 after sustaining a fall from a chair. CT of the pelvis revealed that there is a nondisplaced fracture at the junction of the anterior acetabulum and right superior pubic ramus. There is a nondisplaced fracture of the right inferior pubic ramus as well. He was admitted to the hospital and followed by orthopaedics.  He was seen by PT to make sure that he could get around with a walker.  Dr. Charlann Boxerlin reviewed his weight bearing, progress and recovery expectations.  He was doing well enough to be discharged home with HHPT on 01/01/2014.  He will follow up in 4-5 weeks. Knows to call with any  questions or issues.   Discharge Exam: General appearance: alert, cooperative and no distress Extremities: Homans sign is negative, no sign of DVT, no edema, redness or tenderness in the calves or thighs and no ulcers, gangrene or trophic changes  Disposition:   Home or Self Care with follow up in 2 weeks   Follow-up Information   Follow up with Shelda PalLIN,Jerie Basford D, MD. Schedule an appointment as soon as possible for a visit in 2 weeks.   Specialty:  Orthopedic Surgery   Contact information:   [address] [place] [id number] [phone]       Discharge Orders   Future Appointments Provider Department Dept Phone   01/02/2014 2:30 PM Oprc-Kv Sub Therapist [facility] [phone]   01/14/2014 11:20 AM Cpr[facility]Prma Nurse [facility] [phone]   01/17/2014 1:30 PM Mc-Site 3 Echo Pv 3 [facility] [address] [phone]   01/24/2014 3:00 PM Everette RankJay Varanasi, MD [facility] [address] [phone]   Future Orders Complete By Expires   Call MD / Call 911  As directed    Comments:     If you experience chest pain or shortness of breath, CALL 911 and be transported to the hospital emergency room.  If you develope a fever above 101 F, pus (white drainage) or increased drainage or redness at the wound, or calf pain, call your surgeon's office.  Constipation Prevention  As directed    Comments:     Drink plenty of fluids.  Prune juice may be helpful.  You may use a stool softener, such as Colace (over the counter) 100 mg twice a day.  Use MiraLax (over the counter) for constipation as needed.   Diet - low sodium heart healthy  As directed    Discharge instructions  As directed    Comments:     Follow up in 4-5 weeks at [facility]. Call with any questions or concerns.   Increase activity slowly as tolerated  As directed    Weight bearing as tolerated  As directed     Comments:     With walker / assistance.   Questions:     Laterality:     Extremity:          Medication List         acetaminophen 325 MG tablet  Commonly known as:  TYLENOL  Take 2 tablets (650 mg total) by mouth every 4 (four) hours as needed for fever.     amiodarone 400 MG tablet  Commonly known as:  PACERONE  Take 0.5 tablets (200 mg total) by mouth daily.     aspirin EC 81 MG tablet  Take 1 tablet (81 mg total) by mouth daily.     fenofibrate micronized 134 MG capsule  Commonly known as:  LOFIBRA  Take 1 capsule (134 mg total) by mouth daily before breakfast.     fish oil-omega-3 fatty acids 1000 MG capsule  Take 1 capsule (1 g total) by mouth daily.     furosemide 40 MG tablet  Commonly known as:  LASIX  Take 1 tablet (40 mg total) by mouth daily.     gabapentin 300 MG capsule  Commonly known as:  NEURONTIN  Take 300 mg by mouth 3 (three) times daily. 1 am, 1 lunch, 1 bedtime     glipiZIDE 10 MG tablet  Commonly known as:  GLUCOTROL  Take 10 mg by mouth daily.     HUMALOG KWIKPEN 100 UNIT/ML KiwkPen  Generic drug:  insulin lispro  Inject 20 Units as directed every morning.     insulin glargine 100 UNIT/ML injection  Commonly known as:  LANTUS  Inject 70 Units into the skin every morning.     isosorbide mononitrate 30 MG 24 hr tablet  Commonly known as:  IMDUR  Take 30 mg by mouth every morning.     lisinopril 40 MG tablet  Commonly known as:  PRINIVIL,ZESTRIL  Take 40 mg by mouth daily.     metFORMIN 500 MG tablet  Commonly known as:  GLUCOPHAGE  Take 1,000 mg by mouth daily.     metoprolol succinate 25 MG 24 hr tablet  Commonly known as:  TOPROL-XL  Take 25 mg by mouth daily.     morphine 30 MG 12 hr tablet  Commonly known as:  MS CONTIN  Take 1 tablet (30 mg total) by mouth every 12 (twelve) hours.     nitroGLYCERIN 0.4 MG SL tablet  Commonly known as:  NITROSTAT  Place 0.4 mg under the tongue every 5 (five) minutes as needed.      Oxycodone HCl 10 MG Tabs  Take 2-3 tablets (20-30 mg total) by mouth every 6 (six) hours as needed (pain).     Rivaroxaban 20 MG Tabs tablet  Commonly known as:  XARELTO  Take 1 tablet (20 mg total) by mouth daily with supper.  rosuvastatin 10 MG tablet  Commonly known as:  CRESTOR  Take 1 tablet (10 mg total) by mouth every morning.     saccharomyces boulardii 250 MG capsule  Commonly known as:  FLORASTOR  Take 250 mg by mouth daily.     zolpidem 10 MG tablet  Commonly known as:  AMBIEN  Take 1 tablet (10 mg total) by mouth at bedtime as needed for sleep.         Signed: Anastasio Auerbach. Cylah Fannin   PAC  01/01/2014, 12:31 PM

## 2014-01-01 NOTE — Progress Notes (Signed)
Physical Therapy Treatment Patient Details Name: Thomas CrazeLarry Bogert MRN: [id number] DOB: 1941/04/25 Today's Date: 01/01/2014    History of Present Illness Pt admitted for R hip pain after a fall off of a chair. Pt sustained  R nondisplaced  anterior acetabular and  nondisplaced R inferior pubic rami fxs    PT Comments    Pt was pre medicated prior to session.  [facility].  Required increased time and assist for hygiene as pt had difficulty WBing thru R LE.  Pt only tolerated amb around bed in room.  Pt plans to D/C to home with spouse and use his wheelchair to enter home as there is only one small lip.   Follow Up Recommendations  Home health PT (pt and spouse are requesting D/C home )     Equipment Recommendations  None recommended by PT (pt has a RW/3;1/wheelchair)    Recommendations for Other Services       Precautions / Restrictions Precautions Precautions: Fall Restrictions Weight Bearing Restrictions: No RLE Weight Bearing: Weight bearing as tolerated    Mobility  Bed Mobility Overal bed mobility: Needs Assistance Bed Mobility: Supine to Sit     Supine to sit: Min guard;Min assist     General bed mobility comments: increased, increased time and 25% VC's opn proper tech  Transfers Overall transfer level: Needs assistance Equipment used: Rolling walker (2 wheeled) Transfers: Sit to/from Stand Sit to Stand: Min assist  [facility] with 25% VC's on safety with hand placement.        General transfer comment: cues for UE use. R leg position.  Excessive use/WB of B UE's due to R hip pain  Ambulation/Gait Ambulation/Gait assistance: Min assist Ambulation Distance (Feet): 12 Feet Assistive device: Rolling walker (2 wheeled) Gait Pattern/deviations: Step-to pattern;Decreased stance time - right Gait velocity: decreased   General Gait Details: only able to tolerate amb around bed due to pain level despite pre  medicated    Stairs            Wheelchair Mobility    Modified Rankin (Stroke Patients Only)       Balance                                    Cognition                            Exercises      General Comments        Pertinent Vitals/Pain     Home Living                      Prior Function            PT Goals (current goals can now be found in the care plan section) Progress towards PT goals: Progressing toward goals    Frequency  Min 3X/week    PT Plan      End of Session Equipment Utilized During Treatment: Gait belt Activity Tolerance: Patient limited by pain;Patient limited by fatigue Patient left: in bed;with call bell/phone within reach     Time: 1012-1048 PT Time Calculation (min): 36 min  Charges:  $Gait Training: 8-22 mins $Therapeutic Activity: 8-22 mins  G Codes:      Rica Koyanagi  PTA [facility]  Acute  Rehab Pager      [phone]

## 2014-01-01 NOTE — Care Management Note (Signed)
    Page 1 of 1   01/01/2014     12:13:41 PM   CARE MANAGEMENT NOTE 01/01/2014  Patient:  Thomas [name],Thomas [name]   Account Number:  [id number]  Date Initiated:  12/31/2013  Documentation initiated by:  Lorenda IshiharaPEELE,Kailena Lubas  Subjective/Objective Assessment:   73 yo male admitted s/p fall with pelvic fracture. PTA lived at home with spouse.     Action/Plan:   Home when stable   Anticipated DC Date:  01/01/2014   Anticipated DC Plan:  HOME W HOME HEALTH SERVICES      DC Planning Services  CM consult      [facility]  [facility]   Choice offered to / List presented to:  C-1 Patient        HH arranged  HH-2 PT      [facility]  [facility]   Status of service:  Completed, signed off Medicare Important Message given?  NA - LOS <3 / Initial given by admissions (If response is "NO", the following Medicare IM given date fields will be blank) Date Medicare IM given:   Date Additional Medicare IM given:    Discharge Disposition:  HOME W HOME HEALTH SERVICES  Per UR Regulation:  Reviewed for med. necessity/level of care/duration of stay  If discussed at Long Length of Stay Meetings, dates discussed:    Comments:

## 2014-01-07 ENCOUNTER — Telehealth: Payer: Self-pay

## 2014-01-07 NOTE — Telephone Encounter (Signed)
Patient called about his oxycodone.  He has broken his pelvis.  He needs a refill on his oxycodone.  He was given a script from the hospital but he was advised not to fill it.  Patient is taking more medication due to his injury.  Please advise.

## 2014-01-07 NOTE — Telephone Encounter (Signed)
What does patient want to do here? We last refilled him less than a month ago. Ortho changed his RX without any consultation with us. Does he want 20mg  q6 prn?  He was discharged a week ago without any mention of this fall or his increased use of his medications until today

## 2014-01-08 MED ORDER — OXYCODONE HCL 10 MG PO TABS: 20.0000 mg | ORAL_TABLET | Freq: Four times a day (QID) | ORAL | Status: DC | PRN

## 2014-01-08 NOTE — Telephone Encounter (Signed)
He is NOT to take additional meds with our scripts without calling us to give us word about what's going on. Forewarn him that in the future i will NOT fill rx early  I wrote for #150 with the expectation that this will be returning to the #100 soon that we wrote for last month.

## 2014-01-08 NOTE — Telephone Encounter (Signed)
Patient says he is completely out of his oxycodone 10mg .  He is taking 6 tablets a day. Patient is having home health physical therapy. He says when he came out of the hospital he was in increased pain and has taken all of his oxycodone.  Patient shredded the rx he received from the hospital.  He had an operation.  He is not asking for an increase just a refill.  Please advise.

## 2014-01-08 NOTE — Telephone Encounter (Signed)
Left message for patient that his rx is ready for pick up.  Rx to the front.  Per Dr Riley KillSwartz, patient is not to take any additional medication and cannot be out early again.

## 2014-01-09 ENCOUNTER — Other Ambulatory Visit: Payer: Self-pay | Admitting: *Deleted

## 2014-01-09 MED ORDER — MORPHINE SULFATE ER 30 MG PO TBCR: 30.0000 mg | EXTENDED_RELEASE_TABLET | Freq: Two times a day (BID) | ORAL | Status: DC

## 2014-01-09 NOTE — Telephone Encounter (Signed)
narcotic rx printed for MD to sign for RN visit 01/14/14 

## 2014-01-14 ENCOUNTER — Encounter: Payer: Medicare Other | Attending: Physical Medicine and Rehabilitation | Admitting: *Deleted

## 2014-01-14 ENCOUNTER — Encounter: Payer: Self-pay | Admitting: *Deleted

## 2014-01-14 ENCOUNTER — Other Ambulatory Visit (HOSPITAL_COMMUNITY): Payer: Self-pay | Admitting: Cardiology

## 2014-01-14 VITALS — BP 135/71 | HR 89 | Resp 14

## 2014-01-14 DIAGNOSIS — Z5181 Encounter for therapeutic drug level monitoring: Secondary | ICD-10-CM | POA: Insufficient documentation

## 2014-01-14 DIAGNOSIS — G8929 Other chronic pain: Secondary | ICD-10-CM | POA: Insufficient documentation

## 2014-01-14 DIAGNOSIS — M25551 Pain in right hip: Secondary | ICD-10-CM

## 2014-01-14 DIAGNOSIS — M25559 Pain in unspecified hip: Secondary | ICD-10-CM | POA: Insufficient documentation

## 2014-01-14 DIAGNOSIS — I6529 Occlusion and stenosis of unspecified carotid artery: Secondary | ICD-10-CM

## 2014-01-14 DIAGNOSIS — Z79899 Other long term (current) drug therapy: Secondary | ICD-10-CM | POA: Insufficient documentation

## 2014-01-14 MED ORDER — OXYCODONE HCL 10 MG PO TABS: 20.0000 mg | ORAL_TABLET | Freq: Four times a day (QID) | ORAL | Status: DC | PRN

## 2014-01-14 NOTE — Patient Instructions (Signed)
Follow up with NP Jacalyn LefevreEunice Thomas next month and 2 months with Riley KillSwartz

## 2014-01-14 NOTE — Progress Notes (Signed)
Mr Thomas [name] is her to get his refill on his MS Contin. Fill date 12/17/13 #60 Today NV# 7. He has fallen and fx his pelvis and he says he has been on more pain.  He was hospitalized briefly for this.  Dr Riley KillSwartz has says that he is not to take more medication than is prescribed or get medication from his ortho/Ed for his issues.  He has a prescription given to him when he left the hospital but he says that he shredded it and did not fill. But because he was in more pain he ran out of his oxycodone early and had to come pick up rx on 01/08/14.  This was for #150 and today he has only #75 pills left since 01/08/14.  That is not quite 7 days.  This mean he has been taking between 10-11 pills per day.  His rx sig reads 2-3 q 6 hr prn but the #150 is intended to last for 30 days.  I have counseled him that he now has 75 pills that have to last through 02/07/14.  #150 for 30 days is 5 pills per day. Even at 5/day he does not have enough to last but through 01/29/14.  Dr Riley KillSwartz has agreed to write him another rx today not to be filled until 01/29/14 and he is to take no more than 5 pills per day.  This will be a significant reduction from the 10-11 he has been taking but he has no other choice because if he continues to escalate his medication he will be discharged.  He is in agreement with this plan.  His wife is with him and I recommend that they buy a pill organizer and fill with 5 pills per day and when those are gone his cannot take more. Dr Riley KillSwartz expects that his pain will be decreasing therefore he should be requiring decreasing amounts of narcotics. He will follow up with nurse practioner next month.

## 2014-01-17 ENCOUNTER — Encounter (HOSPITAL_COMMUNITY): Payer: Medicare Other

## 2014-01-24 ENCOUNTER — Ambulatory Visit: Payer: Medicare Other | Admitting: Interventional Cardiology

## 2014-01-29 ENCOUNTER — Telehealth: Payer: Self-pay

## 2014-01-29 ENCOUNTER — Other Ambulatory Visit: Payer: Self-pay | Admitting: Physical Medicine & Rehabilitation

## 2014-01-29 NOTE — Telephone Encounter (Signed)
Patient says that when he left the hospital last he was given hydrocodone.  Advised him to stop taking that medication and that if his urine drug screen was inconsistent again he would be discharged.  Patient verbalized understanding.

## 2014-01-29 NOTE — Telephone Encounter (Signed)
Left message for patient to call office regarding his inconsistent urine drug screen.  Urine drug screen was positive for hydrocodone.  Will ask patient about this.  Per Dr Riley KillSwartz this will be the last time any inconsistencies can occur or patient will be discharged.

## 2014-01-29 NOTE — Progress Notes (Signed)
Urine drug screen from 01/14/2014 was inconsistent.  It came back positive for hydrocodone.

## 2014-02-06 ENCOUNTER — Encounter: Payer: Medicare Other | Admitting: *Deleted

## 2014-02-06 ENCOUNTER — Encounter: Payer: Self-pay | Admitting: Registered Nurse

## 2014-02-06 VITALS — BP 175/72 | HR 76 | Resp 14 | Ht 70.0 in | Wt 188.0 lb

## 2014-02-06 DIAGNOSIS — M25551 Pain in right hip: Principal | ICD-10-CM

## 2014-02-06 DIAGNOSIS — Z96649 Presence of unspecified artificial hip joint: Secondary | ICD-10-CM

## 2014-02-06 DIAGNOSIS — G8929 Other chronic pain: Secondary | ICD-10-CM

## 2014-02-06 MED ORDER — OXYCODONE HCL 10 MG PO TABS: ORAL_TABLET | ORAL | Status: DC

## 2014-02-06 MED ORDER — MORPHINE SULFATE ER 15 MG PO TBCR: EXTENDED_RELEASE_TABLET | ORAL | Status: DC

## 2014-02-06 NOTE — Progress Notes (Signed)
Mr Thomas [name] was here for his follow up and med refills on his CII narcotics.  His pill count on his oxycodone was off by 13 pills.  He has received multiple warnings about taking medications only as prescribed. He has been specifically instructed to get a pill box and put 5 per day in each day slot and that way he would not lose count of how many pills he is taking. This has either not been done, or has failed.  His last UDS contained hydrocodone which is not prescribed by Dr Riley KillSwartz and he was issued a final warning at the time of the results. Dr Riley KillSwartz is discharging him from our practice.  We are tapering his MS Contin by cutting the dose to 15 mg from 30 mg and weaning him off 2 per day for one week and then 1 pill per day for one week and then off.  He will then begin to taper the oxycodone. While tapering MS Contin he will continue to take 10 mg tablets 5 per day for the two weeks and then begin the process of decreasing to 4 tabs/day for one week,  3 tabs per day for one week, 2 tabs per day for one week, then on tab daily for final week and stop.  We have given him a list of surrounding pain clinics to seek care if he chooses to continue on medication.  No visit occurred today with NP or MD

## 2014-02-22 DIAGNOSIS — E119 Type 2 diabetes mellitus without complications: Secondary | ICD-10-CM | POA: Insufficient documentation

## 2014-02-22 DIAGNOSIS — I4891 Unspecified atrial fibrillation: Secondary | ICD-10-CM | POA: Insufficient documentation

## 2014-03-13 ENCOUNTER — Other Ambulatory Visit: Payer: Self-pay | Admitting: *Deleted

## 2014-03-13 MED ORDER — RIVAROXABAN 20 MG PO TABS: 20.0000 mg | ORAL_TABLET | Freq: Every day | ORAL | Status: DC

## 2014-03-28 ENCOUNTER — Encounter (HOSPITAL_COMMUNITY): Payer: Medicare Other

## 2014-04-04 ENCOUNTER — Ambulatory Visit: Payer: Medicare Other | Admitting: Interventional Cardiology

## 2014-04-08 ENCOUNTER — Ambulatory Visit (HOSPITAL_COMMUNITY): Payer: Medicare Other | Attending: Cardiovascular Disease | Admitting: Radiology

## 2014-04-08 DIAGNOSIS — I2510 Atherosclerotic heart disease of native coronary artery without angina pectoris: Secondary | ICD-10-CM | POA: Insufficient documentation

## 2014-04-08 DIAGNOSIS — I6529 Occlusion and stenosis of unspecified carotid artery: Secondary | ICD-10-CM

## 2014-04-08 DIAGNOSIS — I10 Essential (primary) hypertension: Secondary | ICD-10-CM | POA: Insufficient documentation

## 2014-04-08 NOTE — Progress Notes (Signed)
Carotid duplex performed 

## 2014-06-04 ENCOUNTER — Ambulatory Visit (INDEPENDENT_AMBULATORY_CARE_PROVIDER_SITE_OTHER): Payer: Medicare Other | Admitting: Interventional Cardiology

## 2014-06-04 ENCOUNTER — Encounter: Payer: Self-pay | Admitting: Interventional Cardiology

## 2014-06-04 VITALS — BP 136/70 | HR 71 | Ht 71.0 in | Wt 196.0 lb

## 2014-06-04 DIAGNOSIS — I2510 Atherosclerotic heart disease of native coronary artery without angina pectoris: Secondary | ICD-10-CM

## 2014-06-04 DIAGNOSIS — Z7901 Long term (current) use of anticoagulants: Secondary | ICD-10-CM

## 2014-06-04 DIAGNOSIS — I4892 Unspecified atrial flutter: Secondary | ICD-10-CM

## 2014-06-04 DIAGNOSIS — I10 Essential (primary) hypertension: Secondary | ICD-10-CM

## 2014-06-04 DIAGNOSIS — E782 Mixed hyperlipidemia: Secondary | ICD-10-CM

## 2014-06-04 DIAGNOSIS — I5032 Chronic diastolic (congestive) heart failure: Secondary | ICD-10-CM | POA: Insufficient documentation

## 2014-06-04 MED ORDER — ATORVASTATIN CALCIUM 20 MG PO TABS: 20.0000 mg | ORAL_TABLET | Freq: Every day | ORAL | Status: DC

## 2014-06-04 MED ORDER — RIVAROXABAN 20 MG PO TABS: 20.0000 mg | ORAL_TABLET | Freq: Every day | ORAL | Status: DC

## 2014-06-04 MED ORDER — FUROSEMIDE 40 MG PO TABS: 40.0000 mg | ORAL_TABLET | ORAL | Status: DC | PRN

## 2014-06-04 MED ORDER — ATORVASTATIN CALCIUM 20 MG PO TABS
20.0000 mg | ORAL_TABLET | Freq: Every day | ORAL | Status: DC
Start: 2014-06-04 — End: 2014-06-04

## 2014-06-04 NOTE — Patient Instructions (Addendum)
Your physician has recommended you make the following change in your medication:   1. Start back taking Xarelto 20 mg 1 tablet daily.   2. Start Atorvastatin 20 mg 1 tablet daily.  3. Can take lasix as needed.   Your physician recommends that you return for a FASTING lipid and hepatic 09/09/14.  Your physician wants you to follow-up in: 1 year with Dr. Eldridge Dace. You will receive a reminder letter in the mail two months in advance. If you don't receive a letter, please call our office to schedule the follow-up appointment.

## 2014-06-04 NOTE — Progress Notes (Signed)
Patient ID: Thomas [name], male   DOB: 10-Dec-1940, 73 y.o.   MRN: [id number] .jv Patient ID: Thomas [name], male   DOB: 31-May-1941, 73 y.o.   MRN: [id number]    [address] [place], [place]  [address] Phone: [phone] Fax:  [phone]  Date:  06/04/2014   ID:  Thomas [name], DOB [birth year], MRN [id number]  PCP:  [name], MD      History of Present Illness: Thomas [name] is a 73 y.o. male coronary artery disease and peripheral vascular disease. He has an occluded left external iliac artery. His left leg blood flow issues no longer limit him. No further C. difficile colitis. He denies any chest discomfort like his prior angina.  He had right hip surgery reently complicated by atrial flutter.  He converted to NSR with amiodarone.   He had another hip surgery in Nov 2014.  He is walking more.    No chest pain.    Wt Readings from Last 3 Encounters:  06/04/14 196 lb (88.905 kg)  02/06/14 188 lb (85.276 kg)  12/31/13 190 lb 15.8 oz (86.63 kg)     Past Medical History  Diagnosis Date  . Diabetes mellitus   . CAD (coronary artery disease)   . Hypertension   . Claudication in peripheral vascular disease 07/08/2011    Left external iliac occlusion with collaterals; mild-moderate right external iliac disease; moderate calcified left popliteal disease  . Carotid artery disease 2012    < 50% stenosis bilaterally  . Hyperlipidemia   . AAA (abdominal aortic aneurysm)   . Chronic kidney disease     hx kidney stones  . Arthritis   . 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 Acute diastolic heart failure   . Shortness of breath     Current Outpatient Prescriptions  Medication Sig Dispense Refill  . acetaminophen (TYLENOL) 325 MG tablet Take 2 tablets (650 mg total) by mouth every 4 (four) hours as needed for fever.      Marland Kitchen aspirin EC 81 MG tablet Take 1 tablet (81 mg total) by mouth daily.      Marland Kitchen diltiazem (CARDIZEM CD) 120 MG 24 hr  capsule Take 120 mg by mouth daily.      . fenofibrate micronized (LOFIBRA) 134 MG capsule Take 1 capsule (134 mg total) by mouth daily before breakfast.      . ferrous sulfate 325 (65 FE) MG tablet Take 325 mg by mouth daily with breakfast.      . furosemide (LASIX) 40 MG tablet Take 1 tablet (40 mg total) by mouth daily.  30 tablet  6  . gabapentin (NEURONTIN) 300 MG capsule Take 300 mg by mouth 3 (three) times daily. 1 am, 1 lunch, 1 bedtime      . glipiZIDE (GLUCOTROL) 10 MG tablet Take 10 mg by mouth daily.       Marland Kitchen HUMALOG KWIKPEN 100 UNIT/ML SOPN Inject 20 Units as directed every morning.       . insulin glargine (LANTUS) 100 UNIT/ML injection Inject 70 Units into the skin every morning.       Marland Kitchen lisinopril (PRINIVIL,ZESTRIL) 40 MG tablet Take 40 mg by mouth daily.      . metFORMIN (GLUCOPHAGE) 500 MG tablet Take 1,000 mg by mouth daily.       . metoprolol succinate (TOPROL-XL) 25 MG 24 hr tablet Take 25 mg by mouth daily.       . nitroGLYCERIN (NITROSTAT)  0.4 MG SL tablet Place 0.4 mg under the tongue every 5 (five) minutes as needed.       . saccharomyces boulardii (FLORASTOR) 250 MG capsule Take 250 mg by mouth daily.      . temazepam (RESTORIL) 15 MG capsule Take 15 mg by mouth at bedtime as needed for sleep.      . isosorbide mononitrate (IMDUR) 30 MG 24 hr tablet Take 30 mg by mouth every morning.        No current facility-administered medications for this visit.    Allergies:    Allergies  Allergen Reactions  . Nsaids Diarrhea    Social History:  The patient  reports that he quit smoking about 8 years ago. His smoking use included Cigarettes. He smoked 0.00 packs per day. He has never used smokeless tobacco. He reports that he drinks alcohol. He reports that he does not use illicit drugs.   Family History:  The patient's family history includes Cancer in his father; Diabetes in his son; Emphysema in his mother.   ROS:  Please see the history of present illness.  No nausea,  vomiting.  No fevers, chills.  No focal weakness.  No dysuria. Leg pain;   All other systems reviewed and negative.   PHYSICAL EXAM: VS:  BP 136/70  Pulse 71  Ht  (1.803 m)  Wt 196 lb (88.905 kg)  BMI 27.35 kg/m2 Well nourished, well developed, in no acute distress; walking with cane HEENT: normal Neck: no JVD, no carotid bruits Cardiac:  normal S1, S2; RRR;  Lungs:  clear to auscultation bilaterally, no wheezing, rhonchi or rales Abd: soft, nontender, no hepatomegaly Ext: no edema Skin: warm and dry Neuro:   no focal abnormalities noted      ASSESSMENT AND PLAN:  CAD in native artery  Continue Nitroglycerin 0.4 mg tablet, 0.4 mg, 1 tablet as directed, SL, as directed prn chest pain, 25, Refills 6  Aspirin EC Lo-Dose Tablet Delayed Release, 81 MG, 1 tablet, Orally, Once a day Continue Isosorbide Mononitrate Tablet Extended Release 24 Hour, 30 MG, 2 tablet, Orally, Once a day Notes: No exertional angina. . Would not consider restarting Plavix if he takes Xarelto. He is happy to take one less medicine at this time.  Negative Lexiscan Cardiolite in 8/14. 2. Pure hypercholesterolemia  Switch Crestor Tablet, 10 MG, 1 tablet, Orally, Once a day, 90, Refills 3 to atorvastatin 20 mg daily.  Lipid check in 3 months; for cost reasons. Notes: LDL 98 TG elevated in the past. Checked by PMD.  3. Essential hypertension, benign  Continue Lisinopril tablet, 10 mg, 1 tablet, Orally, once a day, 90, Refills 3 Off Metoprolol Succinate Tablet Extended Release 24 Hour, 25 MG, 1 tablet, Orally, Once a day Notes: COntrolled at drug store. Continue lisinopril given beneficial secondary effects for kidneys and MI prevention.  4.  Diastolic heart failure:  Decreased Lasix to 40 mg daily.  Decrease potassium to 40 mEq daily. BMet today.  He stopped it due to frequent urination.   Will change the lasix to prn fluid overload.  He is off of the potassium.   5.  Anticoagulation: Continue Xarelto for now.   Can stop 2 days prior to dental procedere.  Restart date depends on risk of bleeding. Xarelto very expensive.  He is in the donut hole.   6.  AFlutter: Off amiodarone to 200 mg daily.  Finished physical therapy. No cardiac restrictions.     Signed, Fredric Mare,  MD, [facility] 06/04/2014 12:06 PM

## 2014-06-05 DIAGNOSIS — Z959 Presence of cardiac and vascular implant and graft, unspecified: Secondary | ICD-10-CM | POA: Insufficient documentation

## 2014-09-09 ENCOUNTER — Other Ambulatory Visit: Payer: Medicare Other

## 2014-09-12 ENCOUNTER — Other Ambulatory Visit (INDEPENDENT_AMBULATORY_CARE_PROVIDER_SITE_OTHER): Payer: Medicare Other | Admitting: *Deleted

## 2014-09-12 DIAGNOSIS — E782 Mixed hyperlipidemia: Secondary | ICD-10-CM

## 2014-09-12 LAB — HEPATIC FUNCTION PANEL
ALBUMIN: 4.3 g/dL (ref 3.5–5.2)
ALT: 48 U/L (ref 0–53)
AST: 45 U/L — ABNORMAL HIGH (ref 0–37)
Alkaline Phosphatase: 67 U/L (ref 39–117)
Bilirubin, Direct: 0.1 mg/dL (ref 0.0–0.3)
Total Bilirubin: 0.6 mg/dL (ref 0.2–1.2)
Total Protein: 7.5 g/dL (ref 6.0–8.3)

## 2014-09-12 LAB — LIPID PANEL
CHOL/HDL RATIO: 8
Cholesterol: 171 mg/dL (ref 0–200)
HDL: 21.9 mg/dL — ABNORMAL LOW (ref >39.00)
NONHDL: 149.1
VLDL: 98 mg/dL — ABNORMAL HIGH (ref 0.0–40.0)

## 2014-09-14 LAB — LDL CHOLESTEROL, DIRECT: Direct LDL: 90.7 mg/dL

## 2014-10-24 ENCOUNTER — Encounter (HOSPITAL_COMMUNITY): Payer: Self-pay | Admitting: Interventional Cardiology

## 2015-01-15 DIAGNOSIS — I2510 Atherosclerotic heart disease of native coronary artery without angina pectoris: Secondary | ICD-10-CM | POA: Insufficient documentation

## 2015-04-03 ENCOUNTER — Other Ambulatory Visit: Payer: Self-pay | Admitting: Interventional Cardiology

## 2015-04-03 DIAGNOSIS — I6523 Occlusion and stenosis of bilateral carotid arteries: Secondary | ICD-10-CM

## 2015-04-07 ENCOUNTER — Other Ambulatory Visit: Payer: Self-pay

## 2015-04-10 ENCOUNTER — Encounter (HOSPITAL_COMMUNITY): Payer: Self-pay

## 2015-06-02 ENCOUNTER — Other Ambulatory Visit: Payer: Self-pay | Admitting: *Deleted

## 2015-06-02 MED ORDER — ATORVASTATIN CALCIUM 20 MG PO TABS: 20.0000 mg | ORAL_TABLET | Freq: Every day | ORAL | Status: DC

## 2015-06-10 ENCOUNTER — Other Ambulatory Visit: Payer: Self-pay | Admitting: *Deleted

## 2015-06-10 ENCOUNTER — Ambulatory Visit (INDEPENDENT_AMBULATORY_CARE_PROVIDER_SITE_OTHER): Payer: Medicare Other | Admitting: Interventional Cardiology

## 2015-06-10 ENCOUNTER — Encounter: Payer: Self-pay | Admitting: Interventional Cardiology

## 2015-06-10 VITALS — BP 136/80 | HR 134 | Ht 71.0 in | Wt 218.0 lb

## 2015-06-10 DIAGNOSIS — I25119 Atherosclerotic heart disease of native coronary artery with unspecified angina pectoris: Secondary | ICD-10-CM | POA: Diagnosis not present

## 2015-06-10 DIAGNOSIS — I10 Essential (primary) hypertension: Secondary | ICD-10-CM

## 2015-06-10 DIAGNOSIS — I4892 Unspecified atrial flutter: Secondary | ICD-10-CM

## 2015-06-10 DIAGNOSIS — I5031 Acute diastolic (congestive) heart failure: Secondary | ICD-10-CM

## 2015-06-10 DIAGNOSIS — E782 Mixed hyperlipidemia: Secondary | ICD-10-CM

## 2015-06-10 LAB — COMPREHENSIVE METABOLIC PANEL
ALT: 44 U/L (ref 0–53)
AST: 41 U/L — ABNORMAL HIGH (ref 0–37)
Albumin: 4.4 g/dL (ref 3.5–5.2)
Alkaline Phosphatase: 59 U/L (ref 39–117)
BILIRUBIN TOTAL: 0.7 mg/dL (ref 0.2–1.2)
BUN: 18 mg/dL (ref 6–23)
CALCIUM: 9.8 mg/dL (ref 8.4–10.5)
CHLORIDE: 104 meq/L (ref 96–112)
CO2: 29 meq/L (ref 19–32)
Creatinine, Ser: 0.88 mg/dL (ref 0.40–1.50)
GFR: 90.49 mL/min (ref >60.00)
Glucose, Bld: 207 mg/dL — ABNORMAL HIGH (ref 70–99)
POTASSIUM: 3.9 meq/L (ref 3.5–5.1)
Sodium: 139 mEq/L (ref 135–145)
Total Protein: 7.7 g/dL (ref 6.0–8.3)

## 2015-06-10 MED ORDER — RIVAROXABAN 20 MG PO TABS: 20.0000 mg | ORAL_TABLET | Freq: Every day | ORAL | Status: AC

## 2015-06-10 MED ORDER — METOPROLOL SUCCINATE ER 50 MG PO TB24: 50.0000 mg | ORAL_TABLET | Freq: Every day | ORAL | Status: DC

## 2015-06-10 NOTE — Progress Notes (Signed)
Patient ID: Thomas [name], male   DOB: 1940-11-12, 74 y.o.   MRN: [id number]     Cardiology Office Note   Date:  06/10/2015   ID:  Thomas [name], DOB [birth year], MRN [id number]  PCP:  Thomas Boers, MD    No chief complaint on file.  follow-up atrial flutter and coronary artery disease   Wt Readings from Last 3 Encounters:  06/10/15 218 lb (98.884 kg)  06/04/14 196 lb (88.905 kg)  02/06/14 188 lb (85.276 kg)       History of Present Illness: Thomas [name] is a 74 y.o. male  who has had coronary artery disease and peripheral vascular disease. He has an occluded left external iliac artery. His left leg blood flow issues no longer limit him.  Over the past few months, he has had some intermittent tachycardia symptoms. He can feel the pulse in his neck and doubly notes that it is fast. It will stop suddenly and return back to a normal rate. There are no obvious triggers. When the heart rate increases, he feels short of breath. Several months ago, he had significant chest pains requiring him to use nitroglycerin with some relief. In the past 4 months, he has not had to use any nitroglycerin. His walking is limited by hip pain.  Most recently, he has had some left shoulder discomfort. It is not related to exertion.  Past Medical History  Diagnosis Date  . Diabetes mellitus   . CAD (coronary artery disease)   . Hypertension   . Claudication in peripheral vascular disease 07/08/2011    Left external iliac occlusion with collaterals; mild-moderate right external iliac disease; moderate calcified left popliteal disease  . Carotid artery disease 2012    < 50% stenosis bilaterally  . Hyperlipidemia   . AAA (abdominal aortic aneurysm)   . Chronic kidney disease     hx kidney stones  . Arthritis   . 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 Acute diastolic heart failure   . Shortness of breath     Past Surgical History  Procedure Laterality Date  . Shoulder  surgery      right  . Carpal tunnel release  2006    right   . Hip fracture surgery  11/2007    right  . Joint replacement  2009    right total hip replacement  . Coronary angioplasty with stent placement  05/30/2007    Mid RCA 3.0 x 15 mm vision BMS, distal RCA 2.5 x 12 mm vision BMS, PDA 2.0 x 12 mm vision BMS  . Tonsillectomy      as child  . Total hip revision Right 09/03/2013    Procedure: RIGHT TOTAL HIP REVISION;  Surgeon: Mauri Pole, MD;  Location: [facility];  Service: Orthopedics;  Laterality: Right;  . Atrial flutter ablation  11/03/2006  . Coronary angioplasty with stent placement  10/06/2007    LM 25, LAD 25, D1 50, D2 50, CFX mild dz, OM1 diffuse dz, OM 2 100 w/ L-L collaterals, mid-RCA stent with mild ISR, distal RCA stent with > 90 ISR, subtotal PL, Rx w/ 2.5 x 15-mm Promus DES. EF 55%,      Current Outpatient Prescriptions  Medication Sig Dispense Refill  . acetaminophen (TYLENOL) 325 MG tablet Take 2 tablets (650 mg total) by mouth every 4 (four) hours as needed for fever.    Marland Kitchen aspirin EC 81 MG tablet Take 1 tablet (81 mg total) by mouth  daily.    . atorvastatin (LIPITOR) 20 MG tablet Take 1 tablet (20 mg total) by mouth daily. 30 tablet 0  . diltiazem (CARDIZEM CD) 120 MG 24 hr capsule Take 120 mg by mouth daily.    . fenofibrate micronized (LOFIBRA) 134 MG capsule Take 1 capsule (134 mg total) by mouth daily before breakfast.    . gabapentin (NEURONTIN) 300 MG capsule Take 300 mg by mouth 3 (three) times daily. 1 am, 1 lunch, 1 bedtime    . insulin lispro (HUMALOG) 100 UNIT/ML KiwkPen Inject 20 Units into the skin.    Marland Kitchen insulin regular human CONCENTRATED (HUMULIN R) 500 UNIT/ML injection Inject into the skin 2 (two) times daily with a meal. Inject 150 units into the skin at breakfast and 70 units into the skin at supper    . lisinopril (PRINIVIL,ZESTRIL) 40 MG tablet Take 40 mg by mouth.    . metFORMIN (GLUCOPHAGE) 1000 MG tablet Take 1,000 mg by mouth.    .  metoprolol succinate (TOPROL-XL) 50 MG 24 hr tablet Take 1 tablet (50 mg total) by mouth daily. 30 tablet 3  . nitroGLYCERIN (NITROSTAT) 0.4 MG SL tablet Place 0.4 mg under the tongue.    . saccharomyces boulardii (FLORASTOR) 250 MG capsule Take 250 mg by mouth.    . temazepam (RESTORIL) 15 MG capsule Take 15 mg by mouth at bedtime as needed for sleep.    . temazepam (RESTORIL) 15 MG capsule Take 15 mg by mouth.    . rivaroxaban (XARELTO) 20 MG TABS tablet Take 1 tablet (20 mg total) by mouth daily with supper. 30 tablet 3   No current facility-administered medications for this visit.    Allergies:   Ambien  and Nsaids    Social History:  The patient  reports that he quit smoking about 9 years ago. His smoking use included Cigarettes. He has never used smokeless tobacco. He reports that he drinks alcohol. He reports that he does not use illicit drugs.   Family History:  The patient's family history includes Cancer in his father; Diabetes in his son; Emphysema in his mother.    ROS:  Please see the history of present illness.   Otherwise, review of systems are positive fopalpitations, shortness of breath.   All other systems are reviewed and negative.    PHYSICAL EXAM: VS:  BP 136/80 mmHg  Pulse 134  Ht _0  (1.803 m)  Wt 218 lb (98.884 kg)  BMI 30.42 kg/m2 , BMI Body mass index is 30.42 kg/(m^2). GEN: Well nourished, well developed, in no acute distress HEENT: normal Neck: no JVD, carotid bruits, or masses Cardiac: tachycardic; no murmurs, rubs, or gallops,no edema  Respiratory:  clear to auscultation bilaterally, normal work of breathing GI: soft, nontender, nondistended, + BS MS: no deformity or atrophy Skin: warm and dry, no rash Neuro:  Strength and sensation are intact Psych: euthymic mood, full affect   EKG:   The ekg ordered today demonstrates atrial flutter with 2:1 block   Recent Labs: 09/12/2014: ALT 48   Lipid Panel    Component Value Date/Time   CHOL 171  09/12/2014 1103   TRIG * 09/12/2014 1103    490.0 Triglyceride is over 400; calculations on Lipids are invalid.   HDL 21.90* 09/12/2014 1103   CHOLHDL 8 09/12/2014 1103   VLDL 98.0* 09/12/2014 1103   LDLDIRECT 90.7 09/12/2014 1103     Other studies Reviewed: Additional studies/ records that were reviewed today with results demonstrating: Prior  cath report reviewed. PCI to the PDA and RCA in the past. Prior ablation with Dr. Lovena Le in 2008.   ASSESSMENT AND PLAN:  1. CAD: Some anginal type symptoms several months ago. Once his atrial flutter issues have resolved, would consider ischemic workup at that time. If he has refractory chest discomfort to nitroglycerin, he would have to go to the ER. 2. Recurrent atrial flutter: He is 8 years post atrial flutter ablation. Restart her oh for anticoagulation. He took this in the past. Increase Toprol to 50 mg daily. He has a diagnostic procedure on his back tomorrow in which he'll be some type of needle placement. He has been on hold off on taking his Route toe. I did explain to him that if his heart rate is elevated like it is today, they may postpone his procedure since it is more elective. I spoke to Dr. Lovena Le as well. He will see the patient soon regarding the atrial flutter. 3. Hypertension: Well controlled. Continue current medicines. Check electrolytes since we are starting anticoagulation and he has not had a renal function checked in over a year.  4. Hyperlipidemia: Continue atorvastatin. Lipids will have to be checked in the near future.  5. He will see Dr. Lovena Le tomorrow. 6. Shortness of breath when he has the palpitations are likely due to acute diastolic heart failure from the atrial flutter.   Current medicines are reviewed at length with the patient today.  The patient concerns regarding his medicines were addressed.  The following changes have been made:  As above  Labs/ tests ordered today include:   Orders Placed This Encounter    Procedures  . Comp Met (CMET)  . EKG 12-Lead    Recommend 150 minutes/week of aerobic exercise Low fat, low carb, high fiber diet recommended  Disposition:   FU in 1 day with EP.   Teresita Madura., MD  06/10/2015 4:36 PM    [facility] [employer], [place], [place]  [address] Phone: [phone]; Fax: [phone]

## 2015-06-10 NOTE — Patient Instructions (Signed)
**Note De-Identified Jonluke Cobbins Obfuscation** Medication Instructions:  Increase Metoprolol to 50 mg daily and start taking Xarelto 20 mg daily  Labwork: Today (CMET)  Testing/Procedures: None  Follow-Up: Your physician recommends that you schedule a follow-up appointment in: first available with Dr Ladona Ridgel   Any Other Special Instructions Will Be Listed Below (If Applicable). Please do not start taking Xarelto until after your back procedure.

## 2015-06-11 ENCOUNTER — Encounter: Payer: Self-pay | Admitting: *Deleted

## 2015-06-11 ENCOUNTER — Encounter: Payer: Self-pay | Admitting: Internal Medicine

## 2015-06-11 ENCOUNTER — Ambulatory Visit (INDEPENDENT_AMBULATORY_CARE_PROVIDER_SITE_OTHER): Payer: Medicare Other | Admitting: Internal Medicine

## 2015-06-11 VITALS — BP 116/82 | HR 140 | Ht 71.0 in | Wt 217.8 lb

## 2015-06-11 DIAGNOSIS — I70219 Atherosclerosis of native arteries of extremities with intermittent claudication, unspecified extremity: Secondary | ICD-10-CM

## 2015-06-11 DIAGNOSIS — I4892 Unspecified atrial flutter: Secondary | ICD-10-CM

## 2015-06-11 DIAGNOSIS — I2510 Atherosclerotic heart disease of native coronary artery without angina pectoris: Secondary | ICD-10-CM | POA: Diagnosis not present

## 2015-06-11 DIAGNOSIS — I5032 Chronic diastolic (congestive) heart failure: Secondary | ICD-10-CM

## 2015-06-11 DIAGNOSIS — I2584 Coronary atherosclerosis due to calcified coronary lesion: Secondary | ICD-10-CM

## 2015-06-11 LAB — CBC WITH DIFFERENTIAL/PLATELET
BASOS PCT: 0.3 % (ref 0.0–3.0)
Basophils Absolute: 0 10*3/uL (ref 0.0–0.1)
EOS ABS: 0.1 10*3/uL (ref 0.0–0.7)
EOS PCT: 1.4 % (ref 0.0–5.0)
HEMATOCRIT: 41.8 % (ref 39.0–52.0)
HEMOGLOBIN: 14.1 g/dL (ref 13.0–17.0)
LYMPHS PCT: 21.1 % (ref 12.0–46.0)
Lymphs Abs: 1.7 10*3/uL (ref 0.7–4.0)
MCHC: 33.7 g/dL (ref 30.0–36.0)
MCV: 93.8 fl (ref 78.0–100.0)
Monocytes Absolute: 0.6 10*3/uL (ref 0.1–1.0)
Monocytes Relative: 7.4 % (ref 3.0–12.0)
Neutro Abs: 5.6 10*3/uL (ref 1.4–7.7)
Neutrophils Relative %: 69.8 % (ref 43.0–77.0)
Platelets: 161 10*3/uL (ref 150.0–400.0)
RBC: 4.45 Mil/uL (ref 4.22–5.81)
RDW: 13.9 % (ref 11.5–15.5)
WBC: 8 10*3/uL (ref 4.0–10.5)

## 2015-06-11 MED ORDER — AMIODARONE HCL 200 MG PO TABS: 200.0000 mg | ORAL_TABLET | Freq: Every day | ORAL | Status: DC

## 2015-06-11 NOTE — Assessment & Plan Note (Signed)
I have discussed the treatment options. Catheter ablation as well as TEE guided DCCV with initiation of amiodarone as well as longterm anti-coagulation and initiation of amio with cardioversion if he does not convert spontaneously have all been discussed. He feels poorly and would like to try and get back into rhythm sooner than later so we will pursue return to NSR with DCCV after TEE with initiation of amiodarone after DCCV.

## 2015-06-11 NOTE — Assessment & Plan Note (Signed)
He denies angina despite his rapid heart rate. Will continue his current meds and try and uptitrate his beta blocker.

## 2015-06-11 NOTE — Progress Notes (Signed)
HPI Thomas [name] is referred today by Dr. Eldridge Dace. He is a pleasant 74 yo man with a h/o atrial flutter who underwent catheter ablation of typical atrial flutter over 8 years ago. He did well but has had recurrent symptoms and found to have atypical flutter on several occaisions. He has been out of rhythm for about a week. He feels sob, and weak. No syncope. He has chest pressure with exertion. He saw Dr. Eldridge Dace yesterday and was started on metoprolol. He is pending spinal injection and will start his anti-coagulation after the injections have been carried out and the risk of bleeding is thought to be resolved. He feels poorly.  Allergies  Allergen Reactions  . Ambien  [Zolpidem] Other (See Comments)    Sleep walking  . Nsaids Diarrhea     Current Outpatient Prescriptions  Medication Sig Dispense Refill  . acetaminophen (TYLENOL) 325 MG tablet Take 2 tablets (650 mg total) by mouth every 4 (four) hours as needed for fever.    Marland Kitchen aspirin EC 81 MG tablet Take 1 tablet (81 mg total) by mouth daily.    Marland Kitchen atorvastatin (LIPITOR) 20 MG tablet Take 1 tablet (20 mg total) by mouth daily. 30 tablet 0  . diltiazem (CARDIZEM CD) 120 MG 24 hr capsule Take 120 mg by mouth daily.    . fenofibrate micronized (LOFIBRA) 134 MG capsule Take 1 capsule (134 mg total) by mouth daily before breakfast.    . gabapentin (NEURONTIN) 300 MG capsule Take 300 mg by mouth 3 (three) times daily. 1 am, 1 lunch, 1 bedtime    . insulin lispro (HUMALOG) 100 UNIT/ML KiwkPen Inject 20 Units into the skin.    Marland Kitchen insulin regular human CONCENTRATED (HUMULIN R) 500 UNIT/ML injection Inject into the skin 2 (two) times daily with a meal. Inject 150 units into the skin at breakfast and 70 units into the skin at supper    . lisinopril (PRINIVIL,ZESTRIL) 40 MG tablet Take 40 mg by mouth.    . metFORMIN (GLUCOPHAGE) 1000 MG tablet Take 1,000 mg by mouth.    . metoprolol succinate (TOPROL-XL) 50 MG 24 hr tablet Take 1 tablet (50 mg  total) by mouth daily. 30 tablet 3  . nitroGLYCERIN (NITROSTAT) 0.4 MG SL tablet Place 0.4 mg under the tongue.    . rivaroxaban (XARELTO) 20 MG TABS tablet Take 1 tablet (20 mg total) by mouth daily with supper. 30 tablet 3  . saccharomyces boulardii (FLORASTOR) 250 MG capsule Take 250 mg by mouth.    . temazepam (RESTORIL) 15 MG capsule Take 15 mg by mouth at bedtime as needed for sleep.    . temazepam (RESTORIL) 15 MG capsule Take 15 mg by mouth.    Marland Kitchen amiodarone (PACERONE) 200 MG tablet Take 1 tablet (200 mg total) by mouth daily. 30 tablet 6   No current facility-administered medications for this visit.     Past Medical History  Diagnosis Date  . Diabetes mellitus   . CAD (coronary artery disease)   . Hypertension   . Claudication in peripheral vascular disease 07/08/2011    Left external iliac occlusion with collaterals; mild-moderate right external iliac disease; moderate calcified left popliteal disease  . Carotid artery disease 2012    < 50% stenosis bilaterally  . Hyperlipidemia   . AAA (abdominal aortic aneurysm)   . Chronic kidney disease     hx kidney stones  . Arthritis   . 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 Acute diastolic heart failure   . Shortness of breath     ROS:   All systems reviewed and negative except as noted in the HPI.   Past Surgical History  Procedure Laterality Date  . Shoulder surgery      right  . Carpal tunnel release  2006    right   . Hip fracture surgery  11/2007    right  . Joint replacement  2009    right total hip replacement  . Coronary angioplasty with stent placement  05/30/2007    Mid RCA 3.0 x 15 mm vision BMS, distal RCA 2.5 x 12 mm vision BMS, PDA 2.0 x 12 mm vision BMS  . Tonsillectomy      as child  . Total hip revision Right 09/03/2013    Procedure: RIGHT TOTAL HIP REVISION;  Surgeon: Shelda Pal, MD;  Location: [facility];  Service: Orthopedics;  Laterality: Right;  . Atrial flutter  ablation  11/03/2006  . Coronary angioplasty with stent placement  10/06/2007    LM 25, LAD 25, D1 50, D2 50, CFX mild dz, OM1 diffuse dz, OM 2 100 w/ L-L collaterals, mid-RCA stent with mild ISR, distal RCA stent with > 90 ISR, subtotal PL, Rx w/ 2.5 x 15-mm Promus DES. EF 55%,      Family History  Problem Relation Age of Onset  . Emphysema Mother   . Cancer Father     Brain  . Diabetes Son      Social History   Social History  . Marital Status: Married    Spouse Name: N/A  . Number of Children: N/A  . Years of Education: N/A   Occupational History  . Retired    Social History Main Topics  . Smoking status: Former Smoker    Types: Cigarettes    Quit date: 10/11/2005  . Smokeless tobacco: Never Used  . Alcohol Use: Yes     Comment: occasionally  . Drug Use: No  . Sexual Activity: Not on file   Other Topics Concern  . Not on file   Social History Narrative     BP 116/82 mmHg  Pulse 140  Ht 5\' 11"  (1.803 m)  Wt 217 lb 12.8 oz (98.793 kg)  BMI 30.39 kg/m2  Physical Exam:  Well appearing 74 yo man, NAD HEENT: Unremarkable Neck:  6 cm JVD, no thyromegally Back:  No CVA tenderness Lungs:  Clear with no wheezes HEART:  Regular rate rhythm, no murmurs, no rubs, no clicks Abd:  soft, positive bowel sounds, no organomegally, no rebound, no guarding Ext:  2 plus pulses, no edema, no cyanosis, no clubbing Skin:  No rashes no nodules Neuro:  CN II through XII intact, motor grossly intact  EKG - atypical atrial flutter  Assess/Plan:

## 2015-06-11 NOTE — Assessment & Plan Note (Signed)
He is currently limited by dyspnea rather than claudication. Will follow.

## 2015-06-11 NOTE — Assessment & Plan Note (Signed)
His symptoms are class 2B. He will continue his current meds. He will feel better once he returns to NSR.

## 2015-06-11 NOTE — Patient Instructions (Addendum)
Medication Instructions: 1) Take metoprolol succinate (Toprol XL) 50 mg once daily 2) On Saturday (9/3) - start xarelto 20 mg once daily 3) On Wednesday (9/7) - start amiodarone 200 mg once daily  Labwork: - Your physician recommends that you have lab work today: CBC  Procedures/Testing: - Your physician has requested that you have a TEE/Cardioversion. During a TEE, sound waves are used to create images of your heart. It provides your doctor with information about the size and shape of your heart and how well your heart's chambers and valves are working. In this test, a transducer is attached to the end of a flexible tube that is guided down you throat and into your esophagus (the tube leading from your mouth to your stomach) to get a more detailed image of your heart. Once the TEE has determined that a blood clot is not present, the cardioversion begins. Electrical Cardioversion uses a jolt of electricity to your heart either through paddles or wired patches attached to your chest. This is a controlled, usually prescheduled, procedure. This procedure is done at the hospital and you are not awake during the procedure. You usually go home the day of the procedure. Please see the instruction sheet given to you today for more information.  Follow-Up: - Your physician recommends that you schedule a follow-up appointment in: 14 days with the PA/ NP - post cardioversion/ EKG follow up  Any Additional Special Instructions Will Be Listed Below (If Applicable).

## 2015-06-12 ENCOUNTER — Encounter: Payer: Self-pay | Admitting: Internal Medicine

## 2015-06-14 ENCOUNTER — Other Ambulatory Visit: Payer: Self-pay | Admitting: Physician Assistant

## 2015-06-14 ENCOUNTER — Other Ambulatory Visit: Payer: Self-pay | Admitting: Interventional Cardiology

## 2015-06-14 ENCOUNTER — Telehealth: Payer: Self-pay | Admitting: Physician Assistant

## 2015-06-14 DIAGNOSIS — I4892 Unspecified atrial flutter: Secondary | ICD-10-CM

## 2015-06-14 MED ORDER — DILTIAZEM HCL 30 MG PO TABS: 30.0000 mg | ORAL_TABLET | Freq: Four times a day (QID) | ORAL | Status: DC | PRN

## 2015-06-14 NOTE — Telephone Encounter (Signed)
Thomas [name] called because he is very symptomatic from his atrial fibrillation. He is checked his heart rate on his home blood pressure machine and it is greater than 120. He is taking all of his medications as prescribed. He has fatigue, dyspnea on exertion and describes orthopnea. There is no lower extremity edema. There is no cough. He is concerned because his procedures not for another 3 days.  Discussed the situation with Thomas [name]. Advised him he could come to the emergency room but he is very reluctant to do this. Advised him that if he did not wish to come to the emergency room I could call in a prescription for Cardizem short acting 30 mg tablets and he can take is up to 4 times a day as long as he was careful to make sure his blood pressure and heart rate did not drop too low. He stated he would do so.  Thomas [name] also advised that he was occasionally having chest pain and felt like this was from the atrial fibrillation. This was Dr. Lubertha Basque feeling as well. He stated nitroglycerin was helping. I advised that it was okay to use the nitroglycerin for the chest pain but he need to wait at least 5 minutes between tablets and make sure he only took one at a time. Advised him to check his blood pressure if he took more than 1.   Patient stated he would follow instructions and call back if these interventions did not help.

## 2015-06-15 ENCOUNTER — Encounter: Payer: Self-pay | Admitting: Internal Medicine

## 2015-06-17 ENCOUNTER — Inpatient Hospital Stay (HOSPITAL_COMMUNITY)
Admission: AD | Admit: 2015-06-17 | Discharge: 2015-06-24 | DRG: 291 | Disposition: A | Discharge status: Home / Self Care | Payer: Medicare Other | Source: Ambulatory Visit | Attending: Internal Medicine | Admitting: Internal Medicine

## 2015-06-17 ENCOUNTER — Encounter (HOSPITAL_COMMUNITY): Payer: Self-pay | Admitting: Anesthesiology

## 2015-06-17 ENCOUNTER — Encounter (HOSPITAL_COMMUNITY)
Admission: AD | Disposition: A | Discharge status: Home / Self Care | Payer: Self-pay | Source: Ambulatory Visit | Attending: Internal Medicine

## 2015-06-17 DIAGNOSIS — R109 Unspecified abdominal pain: Secondary | ICD-10-CM

## 2015-06-17 DIAGNOSIS — N3289 Other specified disorders of bladder: Secondary | ICD-10-CM | POA: Diagnosis present

## 2015-06-17 DIAGNOSIS — R0600 Dyspnea, unspecified: Secondary | ICD-10-CM

## 2015-06-17 DIAGNOSIS — E11649 Type 2 diabetes mellitus with hypoglycemia without coma: Secondary | ICD-10-CM | POA: Diagnosis present

## 2015-06-17 DIAGNOSIS — K5900 Constipation, unspecified: Secondary | ICD-10-CM | POA: Diagnosis not present

## 2015-06-17 DIAGNOSIS — N281 Cyst of kidney, acquired: Secondary | ICD-10-CM | POA: Diagnosis present

## 2015-06-17 DIAGNOSIS — R310 Gross hematuria: Secondary | ICD-10-CM | POA: Diagnosis not present

## 2015-06-17 DIAGNOSIS — I10 Essential (primary) hypertension: Secondary | ICD-10-CM | POA: Diagnosis present

## 2015-06-17 DIAGNOSIS — E782 Mixed hyperlipidemia: Secondary | ICD-10-CM | POA: Diagnosis present

## 2015-06-17 DIAGNOSIS — I4891 Unspecified atrial fibrillation: Secondary | ICD-10-CM | POA: Diagnosis not present

## 2015-06-17 DIAGNOSIS — Z9989 Dependence on other enabling machines and devices: Secondary | ICD-10-CM

## 2015-06-17 DIAGNOSIS — I483 Typical atrial flutter: Secondary | ICD-10-CM | POA: Diagnosis not present

## 2015-06-17 DIAGNOSIS — I5033 Acute on chronic diastolic (congestive) heart failure: Principal | ICD-10-CM | POA: Diagnosis present

## 2015-06-17 DIAGNOSIS — I4892 Unspecified atrial flutter: Secondary | ICD-10-CM | POA: Diagnosis not present

## 2015-06-17 DIAGNOSIS — G4733 Obstructive sleep apnea (adult) (pediatric): Secondary | ICD-10-CM | POA: Diagnosis present

## 2015-06-17 DIAGNOSIS — R0603 Acute respiratory distress: Secondary | ICD-10-CM

## 2015-06-17 DIAGNOSIS — E118 Type 2 diabetes mellitus with unspecified complications: Secondary | ICD-10-CM | POA: Diagnosis not present

## 2015-06-17 DIAGNOSIS — Z794 Long term (current) use of insulin: Secondary | ICD-10-CM | POA: Diagnosis not present

## 2015-06-17 DIAGNOSIS — I484 Atypical atrial flutter: Secondary | ICD-10-CM | POA: Diagnosis present

## 2015-06-17 DIAGNOSIS — T502X5A Adverse effect of carbonic-anhydrase inhibitors, benzothiadiazides and other diuretics, initial encounter: Secondary | ICD-10-CM | POA: Diagnosis not present

## 2015-06-17 DIAGNOSIS — I2510 Atherosclerotic heart disease of native coronary artery without angina pectoris: Secondary | ICD-10-CM | POA: Diagnosis present

## 2015-06-17 DIAGNOSIS — I481 Persistent atrial fibrillation: Secondary | ICD-10-CM | POA: Diagnosis present

## 2015-06-17 DIAGNOSIS — I959 Hypotension, unspecified: Secondary | ICD-10-CM | POA: Diagnosis present

## 2015-06-17 DIAGNOSIS — N179 Acute kidney failure, unspecified: Secondary | ICD-10-CM | POA: Diagnosis not present

## 2015-06-17 DIAGNOSIS — N170 Acute kidney failure with tubular necrosis: Secondary | ICD-10-CM | POA: Diagnosis present

## 2015-06-17 DIAGNOSIS — D649 Anemia, unspecified: Secondary | ICD-10-CM | POA: Diagnosis present

## 2015-06-17 DIAGNOSIS — E119 Type 2 diabetes mellitus without complications: Secondary | ICD-10-CM

## 2015-06-17 DIAGNOSIS — I209 Angina pectoris, unspecified: Secondary | ICD-10-CM

## 2015-06-17 DIAGNOSIS — N200 Calculus of kidney: Secondary | ICD-10-CM

## 2015-06-17 DIAGNOSIS — K635 Polyp of colon: Secondary | ICD-10-CM | POA: Insufficient documentation

## 2015-06-17 DIAGNOSIS — I340 Nonrheumatic mitral (valve) insufficiency: Secondary | ICD-10-CM | POA: Diagnosis not present

## 2015-06-17 DIAGNOSIS — R1310 Dysphagia, unspecified: Secondary | ICD-10-CM

## 2015-06-17 DIAGNOSIS — I509 Heart failure, unspecified: Secondary | ICD-10-CM | POA: Diagnosis not present

## 2015-06-17 HISTORY — DX: Dependence on other enabling machines and devices: Z99.89

## 2015-06-17 HISTORY — DX: Obstructive sleep apnea (adult) (pediatric): G47.33

## 2015-06-17 LAB — CBC WITH DIFFERENTIAL/PLATELET
BASOS ABS: 0 10*3/uL (ref 0.0–0.1)
BASOS PCT: 0 % (ref 0–1)
Eosinophils Absolute: 0 10*3/uL (ref 0.0–0.7)
Eosinophils Relative: 0 % (ref 0–5)
HEMATOCRIT: 38.7 % — AB (ref 39.0–52.0)
Hemoglobin: 12.5 g/dL — ABNORMAL LOW (ref 13.0–17.0)
LYMPHS PCT: 14 % (ref 12–46)
Lymphs Abs: 1 10*3/uL (ref 0.7–4.0)
MCH: 31.3 pg (ref 26.0–34.0)
MCHC: 32.3 g/dL (ref 30.0–36.0)
MCV: 97 fL (ref 78.0–100.0)
Monocytes Absolute: 0.4 10*3/uL (ref 0.1–1.0)
Monocytes Relative: 6 % (ref 3–12)
NEUTROS ABS: 5.6 10*3/uL (ref 1.7–7.7)
NEUTROS PCT: 80 % — AB (ref 43–77)
Platelets: 145 10*3/uL — ABNORMAL LOW (ref 150–400)
RBC: 3.99 MIL/uL — AB (ref 4.22–5.81)
RDW: 13.4 % (ref 11.5–15.5)
WBC: 7.1 10*3/uL (ref 4.0–10.5)

## 2015-06-17 LAB — COMPREHENSIVE METABOLIC PANEL
ALBUMIN: 3.8 g/dL (ref 3.5–5.0)
ALT: 31 U/L (ref 17–63)
AST: 44 U/L — AB (ref 15–41)
Alkaline Phosphatase: 54 U/L (ref 38–126)
Anion gap: 6 (ref 5–15)
BILIRUBIN TOTAL: 1.1 mg/dL (ref 0.3–1.2)
BUN: 20 mg/dL (ref 6–20)
CHLORIDE: 105 mmol/L (ref 101–111)
CO2: 28 mmol/L (ref 22–32)
CREATININE: 1.2 mg/dL (ref 0.61–1.24)
Calcium: 9 mg/dL (ref 8.9–10.3)
GFR calc Af Amer: >60 mL/min (ref >60)
GFR, EST NON AFRICAN AMERICAN: 59 mL/min — AB (ref >60)
GLUCOSE: 199 mg/dL — AB (ref 65–99)
POTASSIUM: 4.8 mmol/L (ref 3.5–5.1)
Sodium: 139 mmol/L (ref 135–145)
TOTAL PROTEIN: 7.1 g/dL (ref 6.5–8.1)

## 2015-06-17 LAB — GLUCOSE, CAPILLARY
Glucose-Capillary: 148 mg/dL — ABNORMAL HIGH (ref 65–99)
Glucose-Capillary: 174 mg/dL — ABNORMAL HIGH (ref 65–99)
Glucose-Capillary: 225 mg/dL — ABNORMAL HIGH (ref 65–99)
Glucose-Capillary: 226 mg/dL — ABNORMAL HIGH (ref 65–99)

## 2015-06-17 LAB — TROPONIN I
TROPONIN I: 0.04 ng/mL — AB (ref <0.031)
Troponin I: 0.04 ng/mL — ABNORMAL HIGH (ref <0.031)

## 2015-06-17 LAB — TSH: TSH: 1.085 u[IU]/mL (ref 0.350–4.500)

## 2015-06-17 LAB — MRSA PCR SCREENING: MRSA by PCR: NEGATIVE

## 2015-06-17 SURGERY — CANCELLED PROCEDURE

## 2015-06-17 MED ORDER — INSULIN REGULAR HUMAN (CONC) 500 UNIT/ML ~~LOC~~ SOLN
60.0000 [IU] | Freq: Every day | SUBCUTANEOUS | Status: DC
  Administered 2015-06-18: 60 [IU] via SUBCUTANEOUS
  Filled 2015-06-17: qty 20

## 2015-06-17 MED ORDER — SODIUM CHLORIDE 0.9 % IJ SOLN
3.0000 mL | Freq: Two times a day (BID) | INTRAMUSCULAR | Status: DC
  Administered 2015-06-17 – 2015-06-24 (×14): 3 mL via INTRAVENOUS

## 2015-06-17 MED ORDER — CETYLPYRIDINIUM CHLORIDE 0.05 % MT LIQD
7.0000 mL | Freq: Two times a day (BID) | OROMUCOSAL | Status: DC
  Administered 2015-06-17 – 2015-06-24 (×13): 7 mL via OROMUCOSAL

## 2015-06-17 MED ORDER — LISINOPRIL 20 MG PO TABS
40.0000 mg | ORAL_TABLET | Freq: Every day | ORAL | Status: DC
  Administered 2015-06-18: 40 mg via ORAL
  Filled 2015-06-17 (×2): qty 2

## 2015-06-17 MED ORDER — ACETAMINOPHEN 325 MG PO TABS
650.0000 mg | ORAL_TABLET | ORAL | Status: DC | PRN
  Filled 2015-06-17: qty 2

## 2015-06-17 MED ORDER — NITROGLYCERIN IN D5W 200-5 MCG/ML-% IV SOLN
0.0000 ug/min | INTRAVENOUS | Status: DC
  Administered 2015-06-17: 20 ug/min via INTRAVENOUS
  Administered 2015-06-18: 50 ug/min via INTRAVENOUS
  Filled 2015-06-17 (×2): qty 250

## 2015-06-17 MED ORDER — METOPROLOL SUCCINATE ER 50 MG PO TB24: 50.0000 mg | ORAL_TABLET | Freq: Every day | ORAL | Status: DC

## 2015-06-17 MED ORDER — INSULIN ASPART 100 UNIT/ML ~~LOC~~ SOLN
0.0000 [IU] | Freq: Three times a day (TID) | SUBCUTANEOUS | Status: DC
  Administered 2015-06-17: 5 [IU] via SUBCUTANEOUS
  Administered 2015-06-18 (×2): 11 [IU] via SUBCUTANEOUS
  Administered 2015-06-20: 3 [IU] via SUBCUTANEOUS
  Administered 2015-06-21: 2 [IU] via SUBCUTANEOUS
  Administered 2015-06-24: 3 [IU] via SUBCUTANEOUS

## 2015-06-17 MED ORDER — SODIUM CHLORIDE 0.9 % IV SOLN
250.0000 mL | INTRAVENOUS | Status: DC | PRN
  Administered 2015-06-19: 10:00:00 via INTRAVENOUS

## 2015-06-17 MED ORDER — AMIODARONE HCL 200 MG PO TABS
400.0000 mg | ORAL_TABLET | Freq: Every day | ORAL | Status: DC
  Administered 2015-06-18 – 2015-06-24 (×6): 400 mg via ORAL
  Filled 2015-06-17 (×9): qty 2

## 2015-06-17 MED ORDER — DILTIAZEM HCL ER COATED BEADS 120 MG PO CP24
120.0000 mg | ORAL_CAPSULE | Freq: Every day | ORAL | Status: DC
  Administered 2015-06-17: 120 mg via ORAL
  Filled 2015-06-17: qty 1

## 2015-06-17 MED ORDER — INSULIN REGULAR HUMAN (CONC) 500 UNIT/ML ~~LOC~~ SOLN: 70.0000 [IU] | Freq: Two times a day (BID) | SUBCUTANEOUS | Status: DC

## 2015-06-17 MED ORDER — TEMAZEPAM 15 MG PO CAPS
15.0000 mg | ORAL_CAPSULE | Freq: Every day | ORAL | Status: DC
  Administered 2015-06-17 – 2015-06-23 (×7): 15 mg via ORAL
  Filled 2015-06-17 (×7): qty 1

## 2015-06-17 MED ORDER — ATORVASTATIN CALCIUM 20 MG PO TABS
20.0000 mg | ORAL_TABLET | Freq: Every day | ORAL | Status: DC
  Administered 2015-06-18 – 2015-06-24 (×6): 20 mg via ORAL
  Filled 2015-06-17 (×9): qty 1

## 2015-06-17 MED ORDER — AMIODARONE HCL 200 MG PO TABS
200.0000 mg | ORAL_TABLET | Freq: Every day | ORAL | Status: DC
  Administered 2015-06-17: 200 mg via ORAL
  Filled 2015-06-17: qty 1

## 2015-06-17 MED ORDER — SODIUM CHLORIDE 0.9 % IJ SOLN
3.0000 mL | INTRAMUSCULAR | Status: DC | PRN
  Administered 2015-06-21: 3 mL via INTRAVENOUS
  Filled 2015-06-17: qty 3

## 2015-06-17 MED ORDER — FUROSEMIDE 10 MG/ML IJ SOLN
INTRAMUSCULAR | Status: AC
  Filled 2015-06-17: qty 8

## 2015-06-17 MED ORDER — LORAZEPAM 0.5 MG PO TABS
0.5000 mg | ORAL_TABLET | Freq: Four times a day (QID) | ORAL | Status: DC | PRN
  Administered 2015-06-17 – 2015-06-23 (×5): 0.5 mg via ORAL
  Filled 2015-06-17 (×5): qty 1

## 2015-06-17 MED ORDER — INSULIN REGULAR HUMAN (CONC) 500 UNIT/ML ~~LOC~~ SOLN
140.0000 [IU] | Freq: Every day | SUBCUTANEOUS | Status: DC
  Administered 2015-06-18: 140 [IU] via SUBCUTANEOUS
  Filled 2015-06-17: qty 20

## 2015-06-17 MED ORDER — FENOFIBRATE 160 MG PO TABS
160.0000 mg | ORAL_TABLET | Freq: Every day | ORAL | Status: DC
  Administered 2015-06-18: 160 mg via ORAL
  Filled 2015-06-17 (×2): qty 1

## 2015-06-17 MED ORDER — MORPHINE SULFATE (PF) 2 MG/ML IV SOLN
2.0000 mg | INTRAVENOUS | Status: DC | PRN
  Administered 2015-06-17 – 2015-06-24 (×29): 2 mg via INTRAVENOUS
  Filled 2015-06-17 (×30): qty 1

## 2015-06-17 MED ORDER — ONDANSETRON HCL 4 MG/2ML IJ SOLN: 4.0000 mg | Freq: Four times a day (QID) | INTRAMUSCULAR | Status: DC | PRN

## 2015-06-17 MED ORDER — NITROGLYCERIN 0.4 MG SL SUBL: 0.4000 mg | SUBLINGUAL_TABLET | SUBLINGUAL | Status: DC | PRN

## 2015-06-17 MED ORDER — OXYCODONE-ACETAMINOPHEN 5-325 MG PO TABS
1.0000 | ORAL_TABLET | Freq: Two times a day (BID) | ORAL | Status: DC | PRN
  Administered 2015-06-17: 1 via ORAL
  Filled 2015-06-17: qty 1

## 2015-06-17 MED ORDER — INFLUENZA VAC SPLIT QUAD 0.5 ML IM SUSY
0.5000 mL | PREFILLED_SYRINGE | INTRAMUSCULAR | Status: DC
  Filled 2015-06-17: qty 0.5

## 2015-06-17 MED ORDER — RIVAROXABAN 20 MG PO TABS
20.0000 mg | ORAL_TABLET | Freq: Every day | ORAL | Status: DC
  Administered 2015-06-17: 20 mg via ORAL
  Filled 2015-06-17: qty 1

## 2015-06-17 MED ORDER — GABAPENTIN 300 MG PO CAPS
300.0000 mg | ORAL_CAPSULE | Freq: Three times a day (TID) | ORAL | Status: DC
  Administered 2015-06-17 – 2015-06-24 (×20): 300 mg via ORAL
  Filled 2015-06-17 (×26): qty 1

## 2015-06-17 MED ORDER — DILTIAZEM HCL 60 MG PO TABS
60.0000 mg | ORAL_TABLET | Freq: Four times a day (QID) | ORAL | Status: DC
  Administered 2015-06-17 – 2015-06-24 (×23): 60 mg via ORAL
  Filled 2015-06-17 (×30): qty 1

## 2015-06-17 MED ORDER — OXYCODONE-ACETAMINOPHEN 5-325 MG PO TABS
1.0000 | ORAL_TABLET | Freq: Two times a day (BID) | ORAL | Status: DC | PRN
  Administered 2015-06-17 – 2015-06-24 (×9): 1 via ORAL
  Filled 2015-06-17 (×9): qty 1

## 2015-06-17 MED ORDER — LACTATED RINGERS IV SOLN: INTRAVENOUS | Status: DC

## 2015-06-17 MED ORDER — FUROSEMIDE 10 MG/ML IJ SOLN
80.0000 mg | Freq: Two times a day (BID) | INTRAMUSCULAR | Status: DC
  Administered 2015-06-17 – 2015-06-18 (×3): 80 mg via INTRAVENOUS
  Filled 2015-06-17 (×2): qty 8

## 2015-06-17 NOTE — Care Management Note (Signed)
Case Management Note  Patient Details  Name: Thomas [name] MRN: [id number] Date of Birth: [birth year]  Subjective/Objective:       Adm w chf             Action/Plan: lives w wif, pcp dr a boals   Expected Discharge Date:                  Expected Discharge Plan:  Home w Home Health Services  In-House Referral:     Discharge planning Services     Post Acute Care Choice:    Choice offered to:     DME Arranged:    DME Agency:     HH Arranged:    HH Agency:     Status of Service:     Medicare Important Message Given:    Date Medicare IM Given:    Medicare IM give by:    Date Additional Medicare IM Given:    Additional Medicare Important Message give by:     If discussed at Long Length of Stay Meetings, dates discussed:    Additional Comments: ur review done, will moniter for hhc needs as pt progresses.  Hanley Hays, RN 06/17/2015, 12:45 PM

## 2015-06-17 NOTE — H&P (View-Only) (Signed)
HPI Thomas [name] is referred today by Dr. Eldridge Dace. He is a pleasant 74 yo man with a h/o atrial flutter who underwent catheter ablation of typical atrial flutter over 8 years ago. He did well but has had recurrent symptoms and found to have atypical flutter on several occaisions. He has been out of rhythm for about a week. He feels sob, and weak. No syncope. He has chest pressure with exertion. He saw Dr. Eldridge Dace yesterday and was started on metoprolol. He is pending spinal injection and will start his anti-coagulation after the injections have been carried out and the risk of bleeding is thought to be resolved. He feels poorly.  Allergies  Allergen Reactions  . Ambien  [Zolpidem] Other (See Comments)    Sleep walking  . Nsaids Diarrhea     Current Outpatient Prescriptions  Medication Sig Dispense Refill  . acetaminophen (TYLENOL) 325 MG tablet Take 2 tablets (650 mg total) by mouth every 4 (four) hours as needed for fever.    Marland Kitchen aspirin EC 81 MG tablet Take 1 tablet (81 mg total) by mouth daily.    Marland Kitchen atorvastatin (LIPITOR) 20 MG tablet Take 1 tablet (20 mg total) by mouth daily. 30 tablet 0  . diltiazem (CARDIZEM CD) 120 MG 24 hr capsule Take 120 mg by mouth daily.    . fenofibrate micronized (LOFIBRA) 134 MG capsule Take 1 capsule (134 mg total) by mouth daily before breakfast.    . gabapentin (NEURONTIN) 300 MG capsule Take 300 mg by mouth 3 (three) times daily. 1 am, 1 lunch, 1 bedtime    . insulin lispro (HUMALOG) 100 UNIT/ML KiwkPen Inject 20 Units into the skin.    Marland Kitchen insulin regular human CONCENTRATED (HUMULIN R) 500 UNIT/ML injection Inject into the skin 2 (two) times daily with a meal. Inject 150 units into the skin at breakfast and 70 units into the skin at supper    . lisinopril (PRINIVIL,ZESTRIL) 40 MG tablet Take 40 mg by mouth.    . metFORMIN (GLUCOPHAGE) 1000 MG tablet Take 1,000 mg by mouth.    . metoprolol succinate (TOPROL-XL) 50 MG 24 hr tablet Take 1 tablet (50 mg  total) by mouth daily. 30 tablet 3  . nitroGLYCERIN (NITROSTAT) 0.4 MG SL tablet Place 0.4 mg under the tongue.    . rivaroxaban (XARELTO) 20 MG TABS tablet Take 1 tablet (20 mg total) by mouth daily with supper. 30 tablet 3  . saccharomyces boulardii (FLORASTOR) 250 MG capsule Take 250 mg by mouth.    . temazepam (RESTORIL) 15 MG capsule Take 15 mg by mouth at bedtime as needed for sleep.    . temazepam (RESTORIL) 15 MG capsule Take 15 mg by mouth.    Marland Kitchen amiodarone (PACERONE) 200 MG tablet Take 1 tablet (200 mg total) by mouth daily. 30 tablet 6   No current facility-administered medications for this visit.     Past Medical History  Diagnosis Date  . Diabetes mellitus   . CAD (coronary artery disease)   . Hypertension   . Claudication in peripheral vascular disease 07/08/2011    Left external iliac occlusion with collaterals; mild-moderate right external iliac disease; moderate calcified left popliteal disease  . Carotid artery disease 2012    < 50% stenosis bilaterally  . Hyperlipidemia   . AAA (abdominal aortic aneurysm)   . Chronic kidney disease     hx kidney stones  . Arthritis   . 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 Acute diastolic heart failure   . Shortness of breath     ROS:   All systems reviewed and negative except as noted in the HPI.   Past Surgical History  Procedure Laterality Date  . Shoulder surgery      right  . Carpal tunnel release  2006    right   . Hip fracture surgery  11/2007    right  . Joint replacement  2009    right total hip replacement  . Coronary angioplasty with stent placement  05/30/2007    Mid RCA 3.0 x 15 mm vision BMS, distal RCA 2.5 x 12 mm vision BMS, PDA 2.0 x 12 mm vision BMS  . Tonsillectomy      as child  . Total hip revision Right 09/03/2013    Procedure: RIGHT TOTAL HIP REVISION;  Surgeon: Shelda Pal, MD;  Location: [facility];  Service: Orthopedics;  Laterality: Right;  . Atrial flutter  ablation  11/03/2006  . Coronary angioplasty with stent placement  10/06/2007    LM 25, LAD 25, D1 50, D2 50, CFX mild dz, OM1 diffuse dz, OM 2 100 w/ L-L collaterals, mid-RCA stent with mild ISR, distal RCA stent with > 90 ISR, subtotal PL, Rx w/ 2.5 x 15-mm Promus DES. EF 55%,      Family History  Problem Relation Age of Onset  . Emphysema Mother   . Cancer Father     Brain  . Diabetes Son      Social History   Social History  . Marital Status: Married    Spouse Name: N/A  . Number of Children: N/A  . Years of Education: N/A   Occupational History  . Retired    Social History Main Topics  . Smoking status: Former Smoker    Types: Cigarettes    Quit date: 10/11/2005  . Smokeless tobacco: Never Used  . Alcohol Use: Yes     Comment: occasionally  . Drug Use: No  . Sexual Activity: Not on file   Other Topics Concern  . Not on file   Social History Narrative     BP 116/82 mmHg  Pulse 140  Ht 5\' 11"  (1.803 m)  Wt 217 lb 12.8 oz (98.793 kg)  BMI 30.39 kg/m2  Physical Exam:  Well appearing 74 yo man, NAD HEENT: Unremarkable Neck:  6 cm JVD, no thyromegally Back:  No CVA tenderness Lungs:  Clear with no wheezes HEART:  Regular rate rhythm, no murmurs, no rubs, no clicks Abd:  soft, positive bowel sounds, no organomegally, no rebound, no guarding Ext:  2 plus pulses, no edema, no cyanosis, no clubbing Skin:  No rashes no nodules Neuro:  CN II through XII intact, motor grossly intact  EKG - atypical atrial flutter  Assess/Plan:

## 2015-06-17 NOTE — Progress Notes (Signed)
Called to see patient for chest pain. He has had this for 4 days, not worse. Very sharp, can point right at it at the [facility]. Short of breath, not sleeping well, very symptomatic with his a-fib. HR in the 110-120 range. Was just given percocet for pain.  On IV nitroglycerin at 40 mcg/kg/min - not much improvement with this. He is diuresing well.  Seems to be quite anxious. Will add prn ativan - also prn morphine for severe pain, not controlled by percocet. Change cardizem to 60 mg q6hr for better rate control. Increase amiodarone to 400 mg daily. Keep NPO p MN for possible TEE/Cardioversion tomorrow.  Chrystie Nose, MD, [facility] Attending Cardiologist [facility]

## 2015-06-17 NOTE — H&P (Signed)
Patient ID: Thomas [name] MRN: [id number], DOB/AGE: 10-12-1940   Admit date: 06/17/2015   Primary Physician: [name], MD Primary Cardiologist: Dr Ladona Ridgel Dr Eldridge Dace  Pt. Profile:  CHF, a-flutter  Problem List  Past Medical History  Diagnosis Date  . Diabetes mellitus   . CAD (coronary artery disease)   . Hypertension   . Claudication in peripheral vascular disease 07/08/2011    Left external iliac occlusion with collaterals; mild-moderate right external iliac disease; moderate calcified left popliteal disease  . Carotid artery disease 2012    < 50% stenosis bilaterally  . Hyperlipidemia   . AAA (abdominal aortic aneurysm)   . Chronic kidney disease     hx kidney stones  . Arthritis   . 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 Acute diastolic heart failure   . Shortness of breath     Past Surgical History  Procedure Laterality Date  . Shoulder surgery      right  . Carpal tunnel release  2006    right   . Hip fracture surgery  11/2007    right  . Joint replacement  2009    right total hip replacement  . Coronary angioplasty with stent placement  05/30/2007    Mid RCA 3.0 x 15 mm vision BMS, distal RCA 2.5 x 12 mm vision BMS, PDA 2.0 x 12 mm vision BMS  . Tonsillectomy      as child  . Total hip revision Right 09/03/2013    Procedure: RIGHT TOTAL HIP REVISION;  Surgeon: Shelda Pal, MD;  Location: [facility];  Service: Orthopedics;  Laterality: Right;  . Atrial flutter ablation  11/03/2006  . Coronary angioplasty with stent placement  10/06/2007    LM 25, LAD 25, D1 50, D2 50, CFX mild dz, OM1 diffuse dz, OM 2 100 w/ L-L collaterals, mid-RCA stent with mild ISR, distal RCA stent with > 90 ISR, subtotal PL, Rx w/ 2.5 x 15-mm Promus DES. EF 55%,      Allergies  Allergies  Allergen Reactions  . Ambien [Zolpidem] Other (See Comments)    Sleep walking  . Nsaids Diarrhea    HPI  Patient is a 74 y.o. male with a PMHx of with a  h/o CAD, s/p multiple stents placement, the last cath in 2008, PAD, HTN, HLP, atrial flutter who underwent catheter ablation of typical atrial flutter over 8 years ago. He did well but has had recurrent symptoms and found to have atypical flutter on several occasions. He has been out of rhythm for about a week and was seen by Dr Ladona Ridgel on 06/11/15 and scheduled for a TEE/DCCV. In the meantime he has developed progressively worsening SOB, 4 pillow orthopnea, PND and chest pain - retrosternal and left sided, pressure like. His states that his HR goes up to 140 BPM.  No syncope. SOB at rest.   Home Medications  Prior to Admission medications   Medication Sig Start Date End Date Taking? Authorizing Provider  acetaminophen (TYLENOL) 325 MG tablet Take 2 tablets (650 mg total) by mouth every 4 (four) hours as needed for fever. 01/01/14  Yes Lanney Gins, PA-C  atorvastatin (LIPITOR) 20 MG tablet Take 1 tablet (20 mg total) by mouth daily. 06/02/15  Yes Corky Crafts, MD  diltiazem (CARDIZEM) 30 MG tablet Take 1 tablet (30 mg total) by mouth 4 (four) times daily as needed. 06/14/15  Yes Rhonda G Barrett, PA-C  fenofibrate micronized (LOFIBRA) 134  MG capsule Take 1 capsule (134 mg total) by mouth daily before breakfast. 01/01/14  Yes Lanney Gins, PA-C  gabapentin (NEURONTIN) 300 MG capsule Take 300 mg by mouth 3 (three) times daily.    Yes Historical Provider, MD  insulin regular human CONCENTRATED (HUMULIN R) 500 UNIT/ML injection Inject 70-150 Units into the skin 2 (two) times daily with a meal. Inject 150 units into the skin at breakfast and 70 units into the skin at supper   Yes Historical Provider, MD  lisinopril (PRINIVIL,ZESTRIL) 40 MG tablet Take 40 mg by mouth daily.    Yes Historical Provider, MD  metFORMIN (GLUCOPHAGE) 1000 MG tablet Take 1,000 mg by mouth 2 (two) times daily.  06/05/14  Yes Historical Provider, MD  metoprolol succinate (TOPROL-XL) 50 MG 24 hr tablet Take 1 tablet (50 mg  total) by mouth daily. 06/10/15  Yes Corky Crafts, MD  nitroGLYCERIN (NITROSTAT) 0.4 MG SL tablet Place 0.4 mg under the tongue every 5 (five) minutes as needed for chest pain.  05/15/12  Yes Historical Provider, MD  oxyCODONE-acetaminophen (PERCOCET/ROXICET) 5-325 MG per tablet Take 1 tablet by mouth 2 (two) times daily as needed for severe pain.   Yes Historical Provider, MD  rivaroxaban (XARELTO) 20 MG TABS tablet Take 1 tablet (20 mg total) by mouth daily with supper. 06/10/15  Yes Corky Crafts, MD  saccharomyces boulardii (FLORASTOR) 250 MG capsule Take 250 mg by mouth daily.    Yes Historical Provider, MD  temazepam (RESTORIL) 15 MG capsule Take 15 mg by mouth at bedtime.    Yes Historical Provider, MD  amiodarone (PACERONE) 200 MG tablet Take 1 tablet (200 mg total) by mouth daily. Patient not taking: Reported on 06/12/2015 06/11/15   Marinus Maw, MD  aspirin EC 81 MG tablet Take 1 tablet (81 mg total) by mouth daily. Patient not taking: Reported on 06/12/2015 01/01/14   Lanney Gins, PA-C  diltiazem (CARDIZEM CD) 120 MG 24 hr capsule Take 120 mg by mouth daily.    Historical Provider, MD    Family History  Family History  Problem Relation Age of Onset  . Emphysema Mother   . Cancer Father     Brain  . Diabetes Son     Social History  Social History   Social History  . Marital Status: Married    Spouse Name: N/A  . Number of Children: N/A  . Years of Education: N/A   Occupational History  . Retired    Social History Main Topics  . Smoking status: Former Smoker    Types: Cigarettes    Quit date: 10/11/2005  . Smokeless tobacco: Never Used  . Alcohol Use: Yes     Comment: occasionally  . Drug Use: No  . Sexual Activity: Not on file   Other Topics Concern  . Not on file   Social History Narrative     Review of Systems General:  No chills, fever, night sweats or weight changes.  Cardiovascular:  No chest pain, dyspnea on exertion, edema, orthopnea,  palpitations, paroxysmal nocturnal dyspnea. Dermatological: No rash, lesions/masses Respiratory: No cough, dyspnea Urologic: No hematuria, dysuria Abdominal:   No nausea, vomiting, diarrhea, bright red blood per rectum, melena, or hematemesis Neurologic:  No visual changes, wkns, changes in mental status. All other systems reviewed and are otherwise negative except as noted above.  Physical Exam  Blood pressure 137/98, pulse 109, resp. rate 18, SpO2 94 %.  General: Pleasant, in mild respiratory distress Psych: Normal affect.  Neuro: Alert and oriented X 3. Moves all extremities spontaneously. HEENT: Normal  Neck: Supple without bruits, JVD up to the jaws. Lungs:  Resp regular and unlabored, crackles up to the mid lungs. Heart: RRR no s3, s4, or murmurs. Abdomen: Soft, non-tender, non-distended, BS + x 4.  Extremities: No clubbing, cyanosis or edema. DP/PT/Radials 2+ and equal bilaterally.  Labs  No results for input(s): CKTOTAL, CKMB, TROPONINI in the last 72 hours. Lab Results  Component Value Date   WBC 8.0 06/11/2015   HGB 14.1 06/11/2015   HCT 41.8 06/11/2015   MCV 93.8 06/11/2015   PLT 161.0 06/11/2015    Recent Labs Lab 06/10/15 1550  NA 139  K 3.9  CL 104  CO2 29  BUN 18  CREATININE 0.88  CALCIUM 9.8  PROT 7.7  BILITOT 0.7  ALKPHOS 59  ALT 44  AST 41*  GLUCOSE 207*   Lab Results  Component Value Date   CHOL 171 09/12/2014   HDL 21.90* 09/12/2014   TRIG * 09/12/2014    490.0 Triglyceride is over 400; calculations on Lipids are invalid.   Radiology/Studies  No results found.  Echocardiogram   ECG: a-flutter with variable block    ASSESSMENT AND PLAN  1. Acute on chronic diastolic CHF - start lasix 80 mg iv BID, the first dose now - normal Crea, monitor daily, also K  2. Atrial flutter - proceed with TEE/DCCV as this is the culprit for acute CHF exacerbation  3. HTN - add NTG drip  4. Chest pain - check troponin, possible stress test  later, its possible that his chest pain is demand ischemia sec to acute CHF and tachycardia  DVT PPX - Xarelto 20 mg po daily  Signed, Lars Masson, MD, [facility] 06/17/2015, 10:42 AM

## 2015-06-17 NOTE — Progress Notes (Addendum)
Inpatient Diabetes Program Recommendations  AACE/ADA: New Consensus Statement on Inpatient Glycemic Control (2013)  Target Ranges:  Prepandial:   less than 140 mg/dL      Peak postprandial:   less than 180 mg/dL (1-2 hours)      Critically ill patients:  140 - 180 mg/dL   Results for Thomas [name], DAMIANI (MRN [id number]) as of 06/17/2015 14:59  Ref. Range 06/17/2015 09:47 06/17/2015 11:58  Glucose-Capillary Latest Ref Range: 65-99 mg/dL 782 (H) 956 (H)    Diabetes history: DM2 Outpatient Diabetes medications: U500 150 units QAM with breakfast, U500 70 units with supper, Metformin 1000 mg BID Current orders for Inpatient glycemic control: U500 70-150 units BID, Novolog 0-15 units TID with meals  Inpatient Diabetes Program Recommendations Insulin - Basal: Currently U500 is ordered as 70-150 units BID with meals (no parameters for nursing to determine how much should be administered. Please consider discontinuing current U500 order and order U500 140 units QAM with breakfast and U500 60 units with supper.  Note: Spoke with patient about diabetes and home regimen for diabetes control. Patient reports that he is followed by Dr. Jarome Lamas in [place] for diabetes management and currently he takes U500 (insulin pen) 150 units with breakfast, U500 70 units with supper, and Metformin 1000 mg BID as an outpatient for diabetes control.  Patient reports that he has not been taking the U500 very long and he has been seeing Dr. Jarome Lamas about every 6 weeks for follow up. Patient states that his glucose generally ranges from 70-250 mg/dl and he sends a weekly email through My-Chart to Dr. Jarome Lamas with is blood sugars and Dr. Jarome Lamas emails him back with U500 insulin dose changes. Patient reports that he has been on this current U500 regimen for about 4 weeks and that he is still having some issues with fasting hypoglycemia.  Patient states that his wife is planning to bring his U500 insulin pen to the hospital for him to use as an  inpatient. Inquired about his thoughts about decreasing the dose of U500 due to being in the hospital and not being as active as he may normally be. Patient states that he had not thought of that and he agrees that the U500 insulin dosages may need to be decreased by 5-10 units. Informed patient I would discuss with MD and request to decrease dosages by 10 units and patient is agreeable. Patient verbalized understanding of information discussed and he states that he has no further questions at this time related to diabetes. Will continue to follow and make further recommendations as more data is collected.    06/17/15@15 :30- Spoke with Ward Givens, NP and received order for U500 140 units QAM with breakfast and U500 60 units with supper. Not able to enter order since U500 is not on formulary. Called Pharmacy to discuss and request order to be placed. Pharmacy will place order as stated.  Thanks, Orlando Penner, RN, MSN, CCRN, CDE Diabetes Coordinator Inpatient Diabetes Program [phone] (Team Pager) [phone] ([facility] office) [phone] [facility] office) [phone] [facility] office)

## 2015-06-17 NOTE — Interval H&P Note (Signed)
History and Physical Interval Note:  06/17/2015 8:29 AM  Thomas [name]  has presented today for surgery, with the diagnosis of AFLUTTER  The various methods of treatment have been discussed with the patient and family. After consideration of risks, benefits and other options for treatment, the patient has consented to  Procedure(s): TRANSESOPHAGEAL ECHOCARDIOGRAM (TEE) (N/A) CARDIOVERSION (N/A) as a surgical intervention .  The patient's history has been reviewed, patient examined, no change in status, stable for surgery.  I have reviewed the patient's chart and labs.  Questions were answered to the patient's satisfaction.     Lars Masson

## 2015-06-17 NOTE — CV Procedure (Signed)
   Transesophageal Echocardiogram Note, DCCV Note  Amro Winebarger [id number] 1941-01-05  The procedure was cancelled and postponed as the patient is in acute on chronic heart failure with resting SOB and was considered too unstable for the anesthesia. We will admit for diuresis and reschedule TEE/DCCV for Wednesday or Thursday.     Lars Masson, MD, [facility] 06/17/2015, 8:33 AM

## 2015-06-18 ENCOUNTER — Inpatient Hospital Stay (HOSPITAL_COMMUNITY): Payer: Medicare Other

## 2015-06-18 DIAGNOSIS — E119 Type 2 diabetes mellitus without complications: Secondary | ICD-10-CM

## 2015-06-18 DIAGNOSIS — I4892 Unspecified atrial flutter: Secondary | ICD-10-CM

## 2015-06-18 DIAGNOSIS — I509 Heart failure, unspecified: Secondary | ICD-10-CM

## 2015-06-18 LAB — BASIC METABOLIC PANEL
ANION GAP: 9 (ref 5–15)
BUN: 22 mg/dL — ABNORMAL HIGH (ref 6–20)
CALCIUM: 8.5 mg/dL — AB (ref 8.9–10.3)
CO2: 27 mmol/L (ref 22–32)
Chloride: 101 mmol/L (ref 101–111)
Creatinine, Ser: 1.2 mg/dL (ref 0.61–1.24)
GFR, EST NON AFRICAN AMERICAN: 59 mL/min — AB (ref >60)
GLUCOSE: 255 mg/dL — AB (ref 65–99)
POTASSIUM: 4 mmol/L (ref 3.5–5.1)
Sodium: 137 mmol/L (ref 135–145)

## 2015-06-18 LAB — TROPONIN I: TROPONIN I: 0.04 ng/mL — AB (ref <0.031)

## 2015-06-18 LAB — GLUCOSE, CAPILLARY
Glucose-Capillary: 282 mg/dL — ABNORMAL HIGH (ref 65–99)
Glucose-Capillary: 306 mg/dL — ABNORMAL HIGH (ref 65–99)
Glucose-Capillary: 311 mg/dL — ABNORMAL HIGH (ref 65–99)
Glucose-Capillary: 301 mg/dL — ABNORMAL HIGH (ref 65–99)

## 2015-06-18 MED ORDER — METOPROLOL SUCCINATE ER 50 MG PO TB24
75.0000 mg | ORAL_TABLET | Freq: Every day | ORAL | Status: DC
  Administered 2015-06-18 – 2015-06-24 (×6): 75 mg via ORAL
  Filled 2015-06-18 (×13): qty 1

## 2015-06-18 MED ORDER — SODIUM CHLORIDE 0.9 % IV BOLUS (SEPSIS)
250.0000 mL | Freq: Once | INTRAVENOUS | Status: AC
  Administered 2015-06-18: 250 mL via INTRAVENOUS

## 2015-06-18 NOTE — Progress Notes (Signed)
Echocardiogram 2D Echocardiogram has been performed.  Thomas [name] 06/18/2015, 2:14 PM

## 2015-06-18 NOTE — Progress Notes (Signed)
Pt. C/O SOB and can't catch breath. O2 increased to 3L/min and then 5L/min with no change in results. HR 60-70's. Notified MD of change at 1730 and chest x-ray was ordered at this time. Milon Dikes, RN 06/18/2015

## 2015-06-18 NOTE — Progress Notes (Signed)
DAILY PROGRESS NOTE  Subjective:  No events overnight. Still has intermittent chest pain. Breathing has improved. Nitroglycerin does not seem to be helpful.  Objective:  Temp:  [97.3 F (36.3 C)-98.5 F (36.9 C)] 98.3 F (36.8 C) (09/07 0500) Pulse Rate:  [35-144] 129 (09/07 0700) Resp:  [17-31] 23 (09/07 0700) BP: (106-210)/(51-189) 147/93 mmHg (09/07 0700) SpO2:  [88 %-99 %] 92 % (09/07 0700) Weight:  [220 lb 3.8 oz (99.9 kg)-221 lb 5.5 oz (100.4 kg)] 220 lb 3.8 oz (99.9 kg) (09/07 0600) Weight change:   Intake/Output from previous day: 09/06 0701 - 09/07 0700 In: 964.7 [P.O.:720; I.V.:244.7] Out: 2725 [Urine:2725]  Intake/Output from this shift:    Medications: Current Facility-Administered Medications  Medication Dose Route Frequency Provider Last Rate Last Dose  . 0.9 %  sodium chloride infusion  250 mL Intravenous PRN Rogelia Mire, NP      . acetaminophen (TYLENOL) tablet 650 mg  650 mg Oral Q4H PRN Rogelia Mire, NP      . amiodarone (PACERONE) tablet 400 mg  400 mg Oral Daily Pixie Casino, MD      . antiseptic oral rinse (CPC / CETYLPYRIDINIUM CHLORIDE 0.05%) solution 7 mL  7 mL Mouth Rinse BID Evans Lance, MD   7 mL at 06/17/15 2154  . atorvastatin (LIPITOR) tablet 20 mg  20 mg Oral Daily Rogelia Mire, NP      . diltiazem (CARDIZEM) tablet 60 mg  60 mg Oral QID Pixie Casino, MD   60 mg at 06/18/15 7588  . fenofibrate tablet 160 mg  160 mg Oral Daily Rogelia Mire, NP      . furosemide (LASIX) injection 80 mg  80 mg Intravenous BID Dorothy Spark, MD   80 mg at 06/17/15 1707  . gabapentin (NEURONTIN) capsule 300 mg  300 mg Oral TID Rogelia Mire, NP   300 mg at 06/17/15 2154  . Influenza vac split quadrivalent PF (FLUARIX) injection 0.5 mL  0.5 mL Intramuscular Tomorrow-1000 Evans Lance, MD      . insulin aspart (novoLOG) injection 0-15 Units  0-15 Units Subcutaneous TID WC Rogelia Mire, NP   5 Units at  06/17/15 1628  . insulin regular human CONCENTRATED (HUMULIN R) 500 UNIT/ML injection 140 Units  140 Units Subcutaneous Q breakfast Rogelia Mire, NP      . insulin regular human CONCENTRATED (HUMULIN R) 500 UNIT/ML injection 60 Units  60 Units Subcutaneous QHS Rogelia Mire, NP   60 Units at 06/17/15 2206  . lisinopril (PRINIVIL,ZESTRIL) tablet 40 mg  40 mg Oral Daily Rogelia Mire, NP      . LORazepam (ATIVAN) tablet 0.5 mg  0.5 mg Oral Q6H PRN Pixie Casino, MD   0.5 mg at 06/17/15 2338  . metoprolol succinate (TOPROL-XL) 24 hr tablet 75 mg  75 mg Oral Daily Pixie Casino, MD      . morphine 2 MG/ML injection 2 mg  2 mg Intravenous Q2H PRN Pixie Casino, MD   2 mg at 06/18/15 0414  . nitroGLYCERIN (NITROSTAT) SL tablet 0.4 mg  0.4 mg Sublingual Q5 min PRN Rogelia Mire, NP      . nitroGLYCERIN 50 mg in dextrose 5 % 250 mL (0.2 mg/mL) infusion  0-200 mcg/min Intravenous Titrated Dorothy Spark, MD 12 mL/hr at 06/18/15 0515 40 mcg/min at 06/18/15 0515  . ondansetron (ZOFRAN) injection 4 mg  4 mg Intravenous  Q6H PRN Rogelia Mire, NP      . oxyCODONE-acetaminophen (PERCOCET/ROXICET) 5-325 MG per tablet 1 tablet  1 tablet Oral BID PRN Pixie Casino, MD   1 tablet at 06/18/15 0516  . rivaroxaban (XARELTO) tablet 20 mg  20 mg Oral Q supper Rogelia Mire, NP   20 mg at 06/17/15 1628  . sodium chloride 0.9 % injection 3 mL  3 mL Intravenous Q12H Rogelia Mire, NP   3 mL at 06/17/15 2154  . sodium chloride 0.9 % injection 3 mL  3 mL Intravenous PRN Rogelia Mire, NP      . temazepam (RESTORIL) capsule 15 mg  15 mg Oral QHS Rogelia Mire, NP   15 mg at 06/17/15 2154    Physical Exam: General appearance: alert and no distress Neck: JVD - 3 cm above sternal notch and no carotid bruit Lungs: clear to auscultation bilaterally Heart: irregularly irregular rhythm Abdomen: soft, non-tender; bowel sounds normal; no masses,  no organomegaly and  obese Extremities: extremities normal, atraumatic, no cyanosis or edema Pulses: 2+ and symmetric Skin: Skin color, texture, turgor normal. No rashes or lesions Neurologic: Grossly normal Psych: Affable  Lab Results: Results for orders placed or performed during the hospital encounter of 06/17/15 (from the past 48 hour(s))  Glucose, capillary     Status: Abnormal   Collection Time: 06/17/15  9:47 AM  Result Value Ref Range   Glucose-Capillary 148 (H) 65 - 99 mg/dL  Glucose, capillary     Status: Abnormal   Collection Time: 06/17/15 11:58 AM  Result Value Ref Range   Glucose-Capillary 174 (H) 65 - 99 mg/dL   Comment 1 Capillary Specimen   MRSA PCR Screening     Status: None   Collection Time: 06/17/15 12:48 PM  Result Value Ref Range   MRSA by PCR NEGATIVE NEGATIVE    Comment:        The GeneXpert MRSA Assay (FDA approved for NASAL specimens only), is one component of a comprehensive MRSA colonization surveillance program. It is not intended to diagnose MRSA infection nor to guide or monitor treatment for MRSA infections.   CBC WITH DIFFERENTIAL     Status: Abnormal   Collection Time: 06/17/15  1:25 PM  Result Value Ref Range   WBC 7.1 4.0 - 10.5 K/uL   RBC 3.99 (L) 4.22 - 5.81 MIL/uL   Hemoglobin 12.5 (L) 13.0 - 17.0 g/dL   HCT 38.7 (L) 39.0 - 52.0 %   MCV 97.0 78.0 - 100.0 fL   MCH 31.3 26.0 - 34.0 pg   MCHC 32.3 30.0 - 36.0 g/dL   RDW 13.4 11.5 - 15.5 %   Platelets 145 (L) 150 - 400 K/uL   Neutrophils Relative % 80 (H) 43 - 77 %   Neutro Abs 5.6 1.7 - 7.7 K/uL   Lymphocytes Relative 14 12 - 46 %   Lymphs Abs 1.0 0.7 - 4.0 K/uL   Monocytes Relative 6 3 - 12 %   Monocytes Absolute 0.4 0.1 - 1.0 K/uL   Eosinophils Relative 0 0 - 5 %   Eosinophils Absolute 0.0 0.0 - 0.7 K/uL   Basophils Relative 0 0 - 1 %   Basophils Absolute 0.0 0.0 - 0.1 K/uL  Comprehensive metabolic panel     Status: Abnormal   Collection Time: 06/17/15  1:25 PM  Result Value Ref Range    Sodium 139 135 - 145 mmol/L   Potassium 4.8 3.5 - 5.1  mmol/L   Chloride 105 101 - 111 mmol/L   CO2 28 22 - 32 mmol/L   Glucose, Bld 199 (H) 65 - 99 mg/dL   BUN 20 6 - 20 mg/dL   Creatinine, Ser 1.20 0.61 - 1.24 mg/dL   Calcium 9.0 8.9 - 10.3 mg/dL   Total Protein 7.1 6.5 - 8.1 g/dL   Albumin 3.8 3.5 - 5.0 g/dL   AST 44 (H) 15 - 41 U/L   ALT 31 17 - 63 U/L   Alkaline Phosphatase 54 38 - 126 U/L   Total Bilirubin 1.1 0.3 - 1.2 mg/dL   GFR calc non Af Amer 59 (L) >60 mL/min   GFR calc Af Amer >60 >60 mL/min    Comment: (NOTE) The eGFR has been calculated using the CKD EPI equation. This calculation has not been validated in all clinical situations. eGFR's persistently <60 mL/min signify possible Chronic Kidney Disease.    Anion gap 6 5 - 15  Troponin I     Status: Abnormal   Collection Time: 06/17/15  1:25 PM  Result Value Ref Range   Troponin I 0.04 (H) <0.031 ng/mL    Comment:        PERSISTENTLY INCREASED TROPONIN VALUES IN THE RANGE OF 0.04-0.49 ng/mL CAN BE SEEN IN:       -UNSTABLE ANGINA       -CONGESTIVE HEART FAILURE       -MYOCARDITIS       -CHEST TRAUMA       -ARRYHTHMIAS       -LATE PRESENTING MYOCARDIAL INFARCTION       -COPD   CLINICAL FOLLOW-UP RECOMMENDED.   TSH     Status: None   Collection Time: 06/17/15  1:25 PM  Result Value Ref Range   TSH 1.085 0.350 - 4.500 uIU/mL  Glucose, capillary     Status: Abnormal   Collection Time: 06/17/15  4:26 PM  Result Value Ref Range   Glucose-Capillary 225 (H) 65 - 99 mg/dL   Comment 1 Capillary Specimen    Comment 2 Notify RN   Troponin I     Status: Abnormal   Collection Time: 06/17/15  6:35 PM  Result Value Ref Range   Troponin I 0.04 (H) <0.031 ng/mL    Comment:        PERSISTENTLY INCREASED TROPONIN VALUES IN THE RANGE OF 0.04-0.49 ng/mL CAN BE SEEN IN:       -UNSTABLE ANGINA       -CONGESTIVE HEART FAILURE       -MYOCARDITIS       -CHEST TRAUMA       -ARRYHTHMIAS       -LATE PRESENTING MYOCARDIAL  INFARCTION       -COPD   CLINICAL FOLLOW-UP RECOMMENDED.   Glucose, capillary     Status: Abnormal   Collection Time: 06/17/15  9:53 PM  Result Value Ref Range   Glucose-Capillary 226 (H) 65 - 99 mg/dL   Comment 1 Capillary Specimen   Troponin I     Status: Abnormal   Collection Time: 06/18/15 12:40 AM  Result Value Ref Range   Troponin I 0.04 (H) <0.031 ng/mL    Comment:        PERSISTENTLY INCREASED TROPONIN VALUES IN THE RANGE OF 0.04-0.49 ng/mL CAN BE SEEN IN:       -UNSTABLE ANGINA       -CONGESTIVE HEART FAILURE       -MYOCARDITIS       -CHEST TRAUMA       -  ARRYHTHMIAS       -LATE PRESENTING MYOCARDIAL INFARCTION       -COPD   CLINICAL FOLLOW-UP RECOMMENDED.   Basic metabolic panel     Status: Abnormal   Collection Time: 06/18/15 12:40 AM  Result Value Ref Range   Sodium 137 135 - 145 mmol/L   Potassium 4.0 3.5 - 5.1 mmol/L   Chloride 101 101 - 111 mmol/L   CO2 27 22 - 32 mmol/L   Glucose, Bld 255 (H) 65 - 99 mg/dL   BUN 22 (H) 6 - 20 mg/dL   Creatinine, Ser 1.20 0.61 - 1.24 mg/dL   Calcium 8.5 (L) 8.9 - 10.3 mg/dL   GFR calc non Af Amer 59 (L) >60 mL/min   GFR calc Af Amer >60 >60 mL/min    Comment: (NOTE) The eGFR has been calculated using the CKD EPI equation. This calculation has not been validated in all clinical situations. eGFR's persistently <60 mL/min signify possible Chronic Kidney Disease.    Anion gap 9 5 - 15    Imaging: No results found.  Assessment:  Principal Problem:   Atrial flutter with rapid ventricular response Active Problems:   Diabetes mellitus   Essential hypertension   Mixed hyperlipidemia   Acute on chronic diastolic CHF (congestive heart failure), NYHA class 1   Plan:  1. Diuresed well overnight. Breathing is better. Continue diuresis today. Wean nitroglycerin. On home insulin regimen for diabetes. Increase metoprolol XL to 75 mg for better BP and rate control today. NPO p MN for TEE/Cardioversion tomorrow.  Time  Spent Directly with Patient:  15 minutes  Length of Stay:  LOS: 1 day   Pixie Casino, MD, [facility] Attending Cardiologist [place] 06/18/2015, 8:56 AM

## 2015-06-19 ENCOUNTER — Inpatient Hospital Stay (HOSPITAL_COMMUNITY): Payer: Medicare Other

## 2015-06-19 ENCOUNTER — Encounter (HOSPITAL_COMMUNITY): Payer: Self-pay | Admitting: *Deleted

## 2015-06-19 ENCOUNTER — Inpatient Hospital Stay (HOSPITAL_COMMUNITY): Admission: AD | Admit: 2015-06-19 | Payer: Medicare Other | Source: Ambulatory Visit | Admitting: Cardiology

## 2015-06-19 ENCOUNTER — Encounter (HOSPITAL_COMMUNITY)
Admission: AD | Disposition: A | Discharge status: Home / Self Care | Payer: Self-pay | Source: Ambulatory Visit | Attending: Internal Medicine

## 2015-06-19 ENCOUNTER — Inpatient Hospital Stay (HOSPITAL_COMMUNITY): Payer: Medicare Other | Admitting: Anesthesiology

## 2015-06-19 DIAGNOSIS — E118 Type 2 diabetes mellitus with unspecified complications: Secondary | ICD-10-CM

## 2015-06-19 DIAGNOSIS — N179 Acute kidney failure, unspecified: Secondary | ICD-10-CM | POA: Diagnosis not present

## 2015-06-19 LAB — GLUCOSE, CAPILLARY
Glucose-Capillary: 208 mg/dL — ABNORMAL HIGH (ref 65–99)
Glucose-Capillary: 160 mg/dL — ABNORMAL HIGH (ref 65–99)
Glucose-Capillary: 112 mg/dL — ABNORMAL HIGH (ref 65–99)
Glucose-Capillary: 117 mg/dL — ABNORMAL HIGH (ref 65–99)

## 2015-06-19 LAB — URINALYSIS, ROUTINE W REFLEX MICROSCOPIC
BILIRUBIN URINE: NEGATIVE
Glucose, UA: 500 mg/dL — AB
KETONES UR: NEGATIVE mg/dL
LEUKOCYTES UA: NEGATIVE
NITRITE: NEGATIVE
PH: 5 (ref 5.0–8.0)
Protein, ur: >300 mg/dL — AB
Specific Gravity, Urine: 1.015 (ref 1.005–1.030)
UROBILINOGEN UA: 0.2 mg/dL (ref 0.0–1.0)

## 2015-06-19 LAB — RENAL FUNCTION PANEL
Albumin: 3.8 g/dL (ref 3.5–5.0)
Anion gap: 11 (ref 5–15)
BUN: 40 mg/dL — ABNORMAL HIGH (ref 6–20)
CALCIUM: 8.6 mg/dL — AB (ref 8.9–10.3)
CHLORIDE: 100 mmol/L — AB (ref 101–111)
CO2: 27 mmol/L (ref 22–32)
CREATININE: 3.78 mg/dL — AB (ref 0.61–1.24)
GFR calc non Af Amer: 15 mL/min — ABNORMAL LOW (ref >60)
GFR, EST AFRICAN AMERICAN: 17 mL/min — AB (ref >60)
GLUCOSE: 142 mg/dL — AB (ref 65–99)
Phosphorus: 5.9 mg/dL — ABNORMAL HIGH (ref 2.5–4.6)
Potassium: 4.2 mmol/L (ref 3.5–5.1)
SODIUM: 138 mmol/L (ref 135–145)

## 2015-06-19 LAB — URINE MICROSCOPIC-ADD ON

## 2015-06-19 LAB — CK: Total CK: 286 U/L (ref 49–397)

## 2015-06-19 LAB — BASIC METABOLIC PANEL
Anion gap: 10 (ref 5–15)
BUN: 34 mg/dL — AB (ref 6–20)
CHLORIDE: 99 mmol/L — AB (ref 101–111)
CO2: 26 mmol/L (ref 22–32)
CREATININE: 3.05 mg/dL — AB (ref 0.61–1.24)
Calcium: 8.5 mg/dL — ABNORMAL LOW (ref 8.9–10.3)
GFR calc Af Amer: 22 mL/min — ABNORMAL LOW (ref >60)
GFR calc non Af Amer: 19 mL/min — ABNORMAL LOW (ref >60)
GLUCOSE: 232 mg/dL — AB (ref 65–99)
Potassium: 4.3 mmol/L (ref 3.5–5.1)
SODIUM: 135 mmol/L (ref 135–145)

## 2015-06-19 LAB — CREATININE, URINE, RANDOM: CREATININE, URINE: 82.73 mg/dL

## 2015-06-19 LAB — SODIUM, URINE, RANDOM: SODIUM UR: 54 mmol/L

## 2015-06-19 SURGERY — CANCELLED PROCEDURE
Anesthesia: Monitor Anesthesia Care

## 2015-06-19 MED ORDER — LEVALBUTEROL HCL 0.63 MG/3ML IN NEBU
0.6300 mg | INHALATION_SOLUTION | RESPIRATORY_TRACT | Status: DC | PRN
  Administered 2015-06-19: 0.63 mg via RESPIRATORY_TRACT
  Filled 2015-06-19: qty 3

## 2015-06-19 MED ORDER — SODIUM CHLORIDE 0.9 % IV BOLUS (SEPSIS)
500.0000 mL | Freq: Once | INTRAVENOUS | Status: AC
  Administered 2015-06-19: 500 mL via INTRAVENOUS

## 2015-06-19 MED ORDER — PROMETHAZINE HCL 25 MG/ML IJ SOLN: 6.2500 mg | INTRAMUSCULAR | Status: DC | PRN

## 2015-06-19 MED ORDER — PROPOFOL INFUSION 10 MG/ML OPTIME
INTRAVENOUS | Status: DC | PRN
  Administered 2015-06-19: 100 ug/kg/min via INTRAVENOUS

## 2015-06-19 MED ORDER — RIVAROXABAN 15 MG PO TABS
15.0000 mg | ORAL_TABLET | Freq: Every day | ORAL | Status: DC
  Administered 2015-06-19 – 2015-06-21 (×3): 15 mg via ORAL
  Filled 2015-06-19 (×4): qty 1

## 2015-06-19 MED ORDER — SODIUM CHLORIDE 0.9 % IV SOLN
INTRAVENOUS | Status: DC
  Administered 2015-06-19: 1000 mL via INTRAVENOUS

## 2015-06-19 MED ORDER — LIDOCAINE HCL (CARDIAC) 20 MG/ML IV SOLN
INTRAVENOUS | Status: DC | PRN
  Administered 2015-06-19: 100 mg via INTRAVENOUS

## 2015-06-19 MED ORDER — INSULIN REGULAR HUMAN (CONC) 500 UNIT/ML ~~LOC~~ SOLN
60.0000 [IU] | Freq: Every day | SUBCUTANEOUS | Status: DC
  Administered 2015-06-19: 60 [IU] via SUBCUTANEOUS

## 2015-06-19 MED ORDER — INSULIN REGULAR HUMAN (CONC) 500 UNIT/ML ~~LOC~~ SOLN: 140.0000 [IU] | Freq: Every day | SUBCUTANEOUS | Status: DC

## 2015-06-19 MED ORDER — FENTANYL CITRATE (PF) 100 MCG/2ML IJ SOLN: 25.0000 ug | INTRAMUSCULAR | Status: DC | PRN

## 2015-06-19 MED ORDER — PROPOFOL INFUSION 10 MG/ML OPTIME: INTRAVENOUS | Status: DC | PRN

## 2015-06-19 MED ORDER — PHENYLEPHRINE HCL 10 MG/ML IJ SOLN
INTRAMUSCULAR | Status: DC | PRN
  Administered 2015-06-19: 120 ug via INTRAVENOUS
  Administered 2015-06-19 (×2): 80 ug via INTRAVENOUS

## 2015-06-19 NOTE — Progress Notes (Signed)
Called to room for patient complaints of SHOB. On 2 LNC with O2 sats of 94%. RT called to room to assist. Patient wheezing bilaterally. MD notified. Xopenex ordered with medication being delivered presently. Will monitor. Wife at bedside.

## 2015-06-19 NOTE — Clinical Documentation Improvement (Signed)
Cardiology  Abnormal Lab/Test Results:    Component      BUN Creatinine  Latest Ref Rng      6 - 20 mg/dL 0.61 - 1.24 mg/dL  06/17/2015     1:25 PM 20 1.20  06/18/2015     12:40 AM 22 (H) 1.20  06/19/2015      34 (H) 3.05 (H)   Component      EGFR (Non-African Amer.)  Latest Ref Rng      >60 mL/min  06/17/2015     1:25 PM 59 (L)  06/18/2015     12:40 AM 59 (L)  06/19/2015      19 (L)   Possible Clinical Conditions associated with below indicators  Acute kidney injury / acute kidney failure (please specify cause/type if known)  Acute kidney injury on chronic kidney disease (if present, please specify stage of ckd if known)  Other Condition  Cannot Clinically Determine   Supporting Information: Past Medical History grid notes history of chronic kidney disease  Please exercise your independent, professional judgment when responding. A specific answer is not anticipated or expected.   Thank you, Mateo Flow, RN [phone] Clinical Documentation Specialist

## 2015-06-19 NOTE — Interval H&P Note (Signed)
History and Physical Interval Note:  06/19/2015 8:40 AM  Thomas [name]  has presented today for surgery, with the diagnosis of AFIB  The various methods of treatment have been discussed with the patient and family. After consideration of risks, benefits and other options for treatment, the patient has consented to  Procedure(s): TRANSESOPHAGEAL ECHOCARDIOGRAM (TEE) (N/A) CARDIOVERSION (N/A) as a surgical intervention .  The patient's history has been reviewed, patient examined, no change in status, stable for surgery.  I have reviewed the patient's chart and labs.  Questions were answered to the patient's satisfaction.     Charlton Haws

## 2015-06-19 NOTE — CV Procedure (Signed)
TEE/DCC:  Despite good anesthesia with propofol unable to pass probe past 25 cm Consistent obstruction to probe and did not feel it was safe to proceed Will order barium swallow.    Charlton Haws

## 2015-06-19 NOTE — Progress Notes (Signed)
MD made aware of pt creatinine. Will pass along to next shift.

## 2015-06-19 NOTE — Consult Note (Signed)
Reason for Consult:ARF Referring Physician: Ladona Ridgel, MD  Thomas [name] is an 74 y.o. male.  HPI: Pt is a 74yo white male with multiple medical problems most notable for CAD, PVD, AAA, DM, HTN, atrial fib/flutter (s/p catheter ablation in the past) and chronic systolic congestive heart failure who was admitted on 06/17/15 with recurrent atrial flutter and for cardioversion, however he developed acute on chronic CHF requiring IV diuresis with improvement of his symptoms.  He has had intermittent episodes of hypotension with SBP's dropping into the 80's.  He went for TEE but was unsuccessful due to an obstruction and was noted to have a marked increase in his BUN/Cr (see trend below).  We have been asked to help further evaluate and manage his ARF.  Of note, he had been taking lisinopril during this hospitalization and had significant diuresis after his episode of decompensated CHF.  He also had evidence of urinary retention and is s/p foley catheter placement and has a history obstructive uropathy related to nephrolithiasis.  He was also found to be hypotensive following his TEE with SBP's back in the 80's.  Trend in Creatinine: CREATININE, SER  Date/Time Value Ref Range Status  06/19/2015 03:10 AM 3.05* 0.61 - 1.24 mg/dL Final  75/64/3329 51:88 AM 1.20 0.61 - 1.24 mg/dL Final  41/66/0630 16:01 PM 1.20 0.61 - 1.24 mg/dL Final  09/32/3557 32:20 PM 0.88 0.40 - 1.50 mg/dL Final  25/42/7062 37:62 PM 0.91 0.50 - 1.35 mg/dL Final  83/15/1761 60:73 PM 1.0 0.4 - 1.5 mg/dL Final  71/03/2693 85:46 AM 0.72 0.50 - 1.35 mg/dL Final  27/12/5007 38:18 AM 0.67 0.50 - 1.35 mg/dL Final  29/93/7169 67:89 AM 0.72 0.50 - 1.35 mg/dL Final  38/07/1750 02:58 AM 0.75 0.50 - 1.35 mg/dL Final  52/77/8242 35:36 PM 0.84 0.50 - 1.35 mg/dL Final  14/43/1540 08:67 AM 0.80 0.50 - 1.35 mg/dL Final  61/95/0932 67:12 AM 0.78 0.50 - 1.35 mg/dL Final  45/80/9983 38:25 AM 0.80 0.50 - 1.35 mg/dL Final  05/39/7673 41:93 AM 0.93 0.50 -  1.35 mg/dL Final  79/11/4095 35:32 AM 0.91 0.4 - 1.5 mg/dL Final  99/24/2683 41:96 AM 0.91 DELTA CHECK NOTED 0.4 - 1.5 mg/dL Final  22/29/7989 21:19 AM 1.49 0.4 - 1.5 mg/dL Final  41/74/0814 48:18 PM 1.43 0.4 - 1.5 mg/dL Final  56/31/4970 26:37 AM 0.83 0.4 - 1.5 mg/dL Final  85/88/5027 74:12 AM 0.86 0.4 - 1.5 mg/dL Final  87/86/7672 09:47 PM 0.81 0.4 - 1.5 mg/dL Final  09/62/8366 29:47 AM 0.77 0.4 - 1.5 mg/dL Final  65/46/5035 46:56 AM 0.80 0.4 - 1.5 mg/dL Final  81/27/5170 01:74 AM 0.98 0.4 - 1.5 mg/dL Final  94/49/6759 16:38 PM 0.97 DELTA CHECK NOTED  0.4 - 1.5 mg/dL Final  46/65/9935 70:17 AM 1.84 DELTA CHECK NOTED* 0.4 - 1.5 mg/dL Final  79/39/0300 92:33 AM 4.66* 0.4 - 1.5 mg/dL Final  00/76/2263 33:54 AM 3.8* 0.4 - 1.5 mg/dL Final  56/25/6389 37:34 PM 3.41* 0.4 - 1.5 mg/dL Final  28/76/8115 72:62 AM 0.94 0.4 - 1.5 mg/dL Final  03/55/9741 63:84 AM 1.10 DELTA CHECK NOTED 0.4 - 1.5 mg/dL Final  53/64/6803 21:22 PM 1.72* 0.4 - 1.5 mg/dL Final  48/25/0037 04:88 AM 0.94 0.4 - 1.5 mg/dL Final  89/16/9450 38:88 AM 0.97 0.4 - 1.5 mg/dL Final  28/00/3491 79:15 AM 1.12 0.4 - 1.5 mg/dL Final  05/69/7948 01:65 AM 1.15 DELTA CHECK NOTED 0.4 - 1.5 mg/dL Final  53/74/8270 78:67 PM 2.35* 0.4 - 1.5 mg/dL Final  11/15/2008 06:47 AM 0.92 0.4 - 1.5 mg/dL Final  54/06/8118 14:78 PM 0.77 0.4 - 1.5 mg/dL Final  29/56/2130 86:57 AM 0.91 0.4 - 1.5 mg/dL Final  84/69/6295 28:41 AM 0.99 0.4 - 1.5 mg/dL Final  32/44/0102 72:53 PM 0.88 0.4 - 1.5 mg/dL Final  66/44/0347 42:59 AM 0.83  Final  05/31/2007 03:28 AM 0.81  Final    PMH:   Past Medical History  Diagnosis Date  . Diabetes mellitus   . CAD (coronary artery disease)   . Hypertension   . Claudication in peripheral vascular disease 07/08/2011    Left external iliac occlusion with collaterals; mild-moderate right external iliac disease; moderate calcified left popliteal disease  . Carotid artery disease 2012    < 50% stenosis bilaterally  .  Hyperlipidemia   . AAA (abdominal aortic aneurysm)   . Chronic kidney disease     hx kidney stones  . Arthritis   . 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 Acute diastolic heart failure   . Shortness of breath     PSH:   Past Surgical History  Procedure Laterality Date  . Shoulder surgery      right  . Carpal tunnel release  2006    right   . Hip fracture surgery  11/2007    right  . Joint replacement  2009    right total hip replacement  . Coronary angioplasty with stent placement  05/30/2007    Mid RCA 3.0 x 15 mm vision BMS, distal RCA 2.5 x 12 mm vision BMS, PDA 2.0 x 12 mm vision BMS  . Tonsillectomy      as child  . Total hip revision Right 09/03/2013    Procedure: RIGHT TOTAL HIP REVISION;  Surgeon: Shelda Pal, MD;  Location: [facility];  Service: Orthopedics;  Laterality: Right;  . Atrial flutter ablation  11/03/2006  . Coronary angioplasty with stent placement  10/06/2007    LM 25, LAD 25, D1 50, D2 50, CFX mild dz, OM1 diffuse dz, OM 2 100 w/ L-L collaterals, mid-RCA stent with mild ISR, distal RCA stent with > 90 ISR, subtotal PL, Rx w/ 2.5 x 15-mm Promus DES. EF 55%,     Allergies:  Allergies  Allergen Reactions  . Ambien [Zolpidem] Other (See Comments)    Sleep walking  . Nsaids Diarrhea    Medications:   Prior to Admission medications   Medication Sig Start Date End Date Taking? Authorizing Provider  acetaminophen (TYLENOL) 325 MG tablet Take 2 tablets (650 mg total) by mouth every 4 (four) hours as needed for fever. 01/01/14  Yes Lanney Gins, PA-C  atorvastatin (LIPITOR) 20 MG tablet Take 1 tablet (20 mg total) by mouth daily. 06/02/15  Yes Corky Crafts, MD  diltiazem (CARDIZEM) 30 MG tablet Take 1 tablet (30 mg total) by mouth 4 (four) times daily as needed. 06/14/15  Yes Rhonda G Barrett, PA-C  fenofibrate micronized (LOFIBRA) 134 MG capsule Take 1 capsule (134 mg total) by mouth daily before breakfast. 01/01/14  Yes  Lanney Gins, PA-C  gabapentin (NEURONTIN) 300 MG capsule Take 300 mg by mouth 3 (three) times daily.    Yes Historical Provider, MD  insulin regular human CONCENTRATED (HUMULIN R) 500 UNIT/ML injection Inject 70-150 Units into the skin 2 (two) times daily with a meal. Inject 150 units into the skin at breakfast and 70 units into the skin at supper   Yes Historical Provider, MD  lisinopril (  PRINIVIL,ZESTRIL) 40 MG tablet Take 40 mg by mouth daily.    Yes Historical Provider, MD  metFORMIN (GLUCOPHAGE) 1000 MG tablet Take 1,000 mg by mouth 2 (two) times daily.  06/05/14  Yes Historical Provider, MD  metoprolol succinate (TOPROL-XL) 50 MG 24 hr tablet Take 1 tablet (50 mg total) by mouth daily. 06/10/15  Yes Corky Crafts, MD  oxyCODONE-acetaminophen (PERCOCET/ROXICET) 5-325 MG per tablet Take 1 tablet by mouth 2 (two) times daily as needed for severe pain.   Yes Historical Provider, MD  rivaroxaban (XARELTO) 20 MG TABS tablet Take 1 tablet (20 mg total) by mouth daily with supper. 06/10/15  Yes Corky Crafts, MD  saccharomyces boulardii (FLORASTOR) 250 MG capsule Take 250 mg by mouth daily.    Yes Historical Provider, MD  temazepam (RESTORIL) 15 MG capsule Take 15 mg by mouth at bedtime.    Yes Historical Provider, MD  amiodarone (PACERONE) 200 MG tablet Take 1 tablet (200 mg total) by mouth daily. Patient not taking: Reported on 06/12/2015 06/11/15   Marinus Maw, MD  aspirin EC 81 MG tablet Take 1 tablet (81 mg total) by mouth daily. Patient not taking: Reported on 06/12/2015 01/01/14   Lanney Gins, PA-C  diltiazem (CARDIZEM CD) 120 MG 24 hr capsule Take 120 mg by mouth daily.    Historical Provider, MD  NITROSTAT 0.4 MG SL tablet TAKE 1 TABLET AS DIRECTED BY PRESCRIBER if needed for CHEST PAIN 06/17/15   Marinus Maw, MD    Inpatient medications: . amiodarone  400 mg Oral Daily  . antiseptic oral rinse  7 mL Mouth Rinse BID  . atorvastatin  20 mg Oral Daily  . diltiazem  60 mg Oral  QID  . gabapentin  300 mg Oral TID  . Influenza vac split quadrivalent PF  0.5 mL Intramuscular Tomorrow-1000  . insulin aspart  0-15 Units Subcutaneous TID WC  . insulin regular human CONCENTRATED  140 Units Subcutaneous Q breakfast  . insulin regular human CONCENTRATED  60 Units Subcutaneous QHS  . metoprolol succinate  75 mg Oral Daily  . rivaroxaban  15 mg Oral Q supper  . sodium chloride  3 mL Intravenous Q12H  . temazepam  15 mg Oral QHS    Discontinued Meds:   Medications Discontinued During This Encounter  Medication Reason  . insulin lispro (HUMALOG) 100 UNIT/ML KiwkPen Patient has not taken in last 30 days  . temazepam (RESTORIL) 15 MG capsule Duplicate  . lactated ringers infusion Patient Transfer  . insulin regular human CONCENTRATED (HUMULIN R) 500 UNIT/ML injection 70-150 Units   . diltiazem (CARDIZEM CD) 24 hr capsule 120 mg   . oxyCODONE-acetaminophen (PERCOCET/ROXICET) 5-325 MG per tablet 1 tablet   . amiodarone (PACERONE) tablet 200 mg   . metoprolol succinate (TOPROL-XL) 24 hr tablet 50 mg   . insulin regular human CONCENTRATED (HUMULIN R) 500 UNIT/ML injection 140 Units Formulary change  . insulin regular human CONCENTRATED (HUMULIN R) 500 UNIT/ML injection 60 Units Formulary change  . fenofibrate tablet 160 mg   . furosemide (LASIX) injection 80 mg   . lisinopril (PRINIVIL,ZESTRIL) tablet 40 mg   . 0.9 %  sodium chloride infusion   . fentaNYL (SUBLIMAZE) injection 25-50 mcg   . promethazine (PHENERGAN) injection 6.25-12.5 mg   . rivaroxaban (XARELTO) tablet 20 mg     Social History:  reports that he quit smoking about 9 years ago. His smoking use included Cigarettes. He has never used smokeless tobacco. He reports  that he drinks alcohol. He reports that he does not use illicit drugs.  Family History:   Family History  Problem Relation Age of Onset  . Emphysema Mother   . Cancer Father     Brain  . Diabetes Son     Pertinent items are noted in  HPI. Weight change: -0.6 kg (-1 lb 5.2 oz)  Intake/Output Summary (Last 24 hours) at 06/19/15 1438 Last data filed at 06/19/15 1300  Gross per 24 hour  Intake    220 ml  Output    300 ml  Net    -80 ml   BP 88/70 mmHg  Pulse 93  Temp(Src) 98.3 F (36.8 C) (Oral)  Resp 20  Ht 5\' 11"  (1.803 m)  Wt 99.8 kg (220 lb 0.3 oz)  BMI 30.70 kg/m2  SpO2 98% Filed Vitals:   06/19/15 1055 06/19/15 1110 06/19/15 1117 06/19/15 1135  BP:  98/44  88/70  Pulse: 45 133  93  Temp:   98.3 F (36.8 C)   TempSrc:   Oral   Resp: 20     Height:      Weight:      SpO2: 93% 98%  98%     General appearance: alert, cooperative and no distress Head: Normocephalic, without obvious abnormality, atraumatic Eyes: negative findings: lids and lashes normal, conjunctivae and sclerae normal and corneas clear Resp: diminished breath sounds bibasilar Cardio: irregularly irregular rhythm and no rub GI: soft, non-tender; bowel sounds normal; no masses,  no organomegaly Extremities: edema trace pretibial/pedal edema  Labs: Basic Metabolic Panel:  Recent Labs Lab 06/17/15 1325 06/18/15 0040 06/19/15 0310  NA 139 137 135  K 4.8 4.0 4.3  CL 105 101 99*  CO2 28 27 26   GLUCOSE 199* 255* 232*  BUN 20 22* 34*  CREATININE 1.20 1.20 3.05*  ALBUMIN 3.8  --   --   CALCIUM 9.0 8.5* 8.5*   Liver Function Tests:  Recent Labs Lab 06/17/15 1325  AST 44*  ALT 31  ALKPHOS 54  BILITOT 1.1  PROT 7.1  ALBUMIN 3.8   No results for input(s): LIPASE, AMYLASE in the last 168 hours. No results for input(s): AMMONIA in the last 168 hours. CBC:  Recent Labs Lab 06/17/15 1325  WBC 7.1  NEUTROABS 5.6  HGB 12.5*  HCT 38.7*  MCV 97.0  PLT 145*   PT/INR: @LABRCNTIP (inr:5) Cardiac Enzymes: ) Recent Labs Lab 06/17/15 1325 06/17/15 1835 06/18/15 0040  TROPONINI 0.04* 0.04* 0.04*   CBG:  Recent Labs Lab 06/18/15 1204 06/18/15 1531 06/18/15 2136 06/19/15 0802 06/19/15 1116  GLUCAP 306* 311* 301*  208* 160*    Iron Studies: No results for input(s): IRON, TIBC, TRANSFERRIN, FERRITIN in the last 168 hours.  Xrays/Other Studies: Dg Chest 2 View  06/18/2015   CLINICAL DATA:  Shortness of breath for 1 day  EXAM: CHEST  2 VIEW  COMPARISON:  12/30/2013  FINDINGS: Moderate enlargement of the cardiomediastinal silhouette is noted. Central vascular congestion without focal edema or pulmonary opacity. No pleural effusion. No acute osseous abnormality.  IMPRESSION: Cardiomegaly without focal acute finding.   Electronically Signed   By: Christiana Pellant M.D.   On: 06/18/2015 18:36     Assessment/Plan: 1.  ARF- in setting of decompensated CHF, relative hypotension, ACE-Inhibition, accelerated IV Diuresis, and urinary retention/BOO. 1. ACE on hold, no recent IV Contrast exposure 2. Renal US pending (history of obstructive uropathy from nephrolithiasis but now sounds more consistent with bladder outlet obstruction with  superimposed ischemic ATN) 3. Calculate FeNa, check CK levels, SPEP/UPEP 4. Normalize BP and hold off on further IV diuretics 5. Follow UOP and daily Scr following Foley catheter placement (will repeat RFP now with increase UOP following foley) 2. CHF- systolic and diastolic (EF 40-45%)- currently stable 3. A flutter/fib- failed attempt at TEE cardioversion.  Plan per cardiology/EP 4. CAD- stable 5. DM- per primary svc 6. HTN- hypotensive at times.  Maintain SBP>100 7. Esophageal obstruction- w/u pending 8. PAD- stable 9. AAA-stable 10. Nephrolithiasis- awaiting renal US   Thomas [name] A 06/19/2015, 12:54 PM

## 2015-06-19 NOTE — Anesthesia Preprocedure Evaluation (Addendum)
Anesthesia Evaluation  Patient identified by MRN, date of birth, ID band Patient awake    Reviewed: Allergy & Precautions, NPO status , Patient's Chart, lab work & pertinent test results  Airway Mallampati: II  TM Distance: >3 FB Neck ROM: Full    Dental no notable dental hx.    Pulmonary neg pulmonary ROS, former smoker,    Pulmonary exam normal breath sounds clear to auscultation       Cardiovascular hypertension, Pt. on medications + CAD, + Cardiac Stents, + Peripheral Vascular Disease and +CHF   Rhythm:Irregular Rate:Normal  Left ventricle: The cavity size was mildly dilated. There was mild concentric hypertrophy. Systolic function was mildly to moderately reduced. The estimated ejection fraction was in the range of 40% to 45%. The study was not technically sufficient to allow evaluation of LV diastolic dysfunction due to atrial fibrillation. - Aortic root: The aortic root was normal in size. - Mitral valve: There was trivial regurgitation. - Left atrium: The atrium was moderately dilated. - Right ventricle: Systolic function was mildly reduced. - Right atrium: The atrium was normal in size. - Tricuspid valve: There was mild regurgitation. - Pulmonary arteries: Systolic pressure was mildly increased. PA peak pressure: 42 mm Hg (S). - Inferior vena cava: The vessel was dilated. The respirophasic diameter changes were blunted (< 50%), consistent with elevated central venous pressure. - Pericardium, extracardiac: There was no pericardial effusion.     Neuro/Psych negative neurological ROS  negative psych ROS   GI/Hepatic negative GI ROS, Neg liver ROS,   Endo/Other  negative endocrine ROSdiabetes  Renal/GU negative Renal ROS  negative genitourinary   Musculoskeletal negative musculoskeletal ROS (+)   Abdominal   Peds negative pediatric ROS (+)  Hematology negative hematology ROS (+)    Anesthesia Other Findings   Reproductive/Obstetrics negative OB ROS                            Anesthesia Physical Anesthesia Plan  ASA: III  Anesthesia Plan: MAC   Post-op Pain Management:    Induction: Intravenous  Airway Management Planned: Nasal Cannula  Additional Equipment:   Intra-op Plan:   Post-operative Plan:   Informed Consent: I have reviewed the patients History and Physical, chart, labs and discussed the procedure including the risks, benefits and alternatives for the proposed anesthesia with the patient or authorized representative who has indicated his/her understanding and acceptance.   Dental advisory given  Plan Discussed with: CRNA and Surgeon  Anesthesia Plan Comments:         Anesthesia Quick Evaluation

## 2015-06-19 NOTE — Progress Notes (Addendum)
DAILY PROGRESS NOTE  Subjective:  Became acutely dyspneic overnight - increased O2 requirement. CXR shows cardiomegaly without acute pulmonary edema. Went for TEE/Cardioversion today but Dr. Johnsie Cancel was unable to pass the TEE probe - an esophagram has been ordered. He is complaining of throat pain.  Objective:  Temp:  [97.7 F (36.5 C)-98.1 F (36.7 C)] 98 F (36.7 C) (09/08 1040) Pulse Rate:  [45-148] 45 (09/08 1055) Resp:  [13-30] 20 (09/08 1055) BP: (84-144)/(37-103) 104/71 mmHg (09/08 1040) SpO2:  [83 %-99 %] 93 % (09/08 1055) Arterial Line BP: (94)/(55) 94/55 mmHg (09/07 2100) FiO2 (%):  [95 %] 95 % (09/08 1040) Weight:  [220 lb 0.3 oz (99.8 kg)] 220 lb 0.3 oz (99.8 kg) (09/08 0500) Weight change: -1 lb 5.2 oz (-0.6 kg)  Intake/Output from previous day: 09/07 0701 - 09/08 0700 In: 60 [I.V.:60] Out: 750 [Urine:750]  Intake/Output from this shift: Total I/O In: 100 [I.V.:100] Out: -   Medications: Current Facility-Administered Medications  Medication Dose Route Frequency Provider Last Rate Last Dose  . 0.9 %  sodium chloride infusion  250 mL Intravenous PRN Rogelia Mire, NP      . 0.9 %  sodium chloride infusion   Intravenous Continuous Pixie Casino, MD 20 mL/hr at 06/19/15 0905 1,000 mL at 06/19/15 0905  . acetaminophen (TYLENOL) tablet 650 mg  650 mg Oral Q4H PRN Rogelia Mire, NP      . amiodarone (PACERONE) tablet 400 mg  400 mg Oral Daily Pixie Casino, MD   400 mg at 06/18/15 1002  . antiseptic oral rinse (CPC / CETYLPYRIDINIUM CHLORIDE 0.05%) solution 7 mL  7 mL Mouth Rinse BID Evans Lance, MD   7 mL at 06/18/15 2200  . atorvastatin (LIPITOR) tablet 20 mg  20 mg Oral Daily Rogelia Mire, NP   20 mg at 06/18/15 1002  . diltiazem (CARDIZEM) tablet 60 mg  60 mg Oral QID Pixie Casino, MD   60 mg at 06/18/15 1230  . fenofibrate tablet 160 mg  160 mg Oral Daily Rogelia Mire, NP   160 mg at 06/18/15 1002  . fentaNYL (SUBLIMAZE)  injection 25-50 mcg  25-50 mcg Intravenous Q5 min PRN Myrtie Soman, MD      . furosemide (LASIX) injection 80 mg  80 mg Intravenous BID Dorothy Spark, MD   80 mg at 06/18/15 1000  . gabapentin (NEURONTIN) capsule 300 mg  300 mg Oral TID Rogelia Mire, NP   300 mg at 06/18/15 2154  . Influenza vac split quadrivalent PF (FLUARIX) injection 0.5 mL  0.5 mL Intramuscular Tomorrow-1000 Evans Lance, MD   0.5 mL at 06/18/15 1014  . insulin aspart (novoLOG) injection 0-15 Units  0-15 Units Subcutaneous TID WC Rogelia Mire, NP   11 Units at 06/18/15 1611  . insulin regular human CONCENTRATED (HUMULIN R) 500 UNIT/ML injection 140 Units  140 Units Subcutaneous Q breakfast Evans Lance, MD      . insulin regular human CONCENTRATED (HUMULIN R) 500 UNIT/ML injection 60 Units  60 Units Subcutaneous QHS Evans Lance, MD      . lisinopril (PRINIVIL,ZESTRIL) tablet 40 mg  40 mg Oral Daily Rogelia Mire, NP   40 mg at 06/18/15 1001  . LORazepam (ATIVAN) tablet 0.5 mg  0.5 mg Oral Q6H PRN Pixie Casino, MD   0.5 mg at 06/19/15 0059  . metoprolol succinate (TOPROL-XL) 24 hr tablet 75 mg  75 mg Oral Daily Pixie Casino, MD   75 mg at 06/18/15 1001  . morphine 2 MG/ML injection 2 mg  2 mg Intravenous Q2H PRN Pixie Casino, MD   2 mg at 06/19/15 8242  . nitroGLYCERIN (NITROSTAT) SL tablet 0.4 mg  0.4 mg Sublingual Q5 min PRN Rogelia Mire, NP      . nitroGLYCERIN 50 mg in dextrose 5 % 250 mL (0.2 mg/mL) infusion  0-200 mcg/min Intravenous Titrated Dorothy Spark, MD   Stopped at 06/18/15 1200  . ondansetron (ZOFRAN) injection 4 mg  4 mg Intravenous Q6H PRN Rogelia Mire, NP      . oxyCODONE-acetaminophen (PERCOCET/ROXICET) 5-325 MG per tablet 1 tablet  1 tablet Oral BID PRN Pixie Casino, MD   1 tablet at 06/18/15 1527  . promethazine (PHENERGAN) injection 6.25-12.5 mg  6.25-12.5 mg Intravenous Q15 min PRN Myrtie Soman, MD      . rivaroxaban Alveda Reasons) tablet 20 mg  20 mg  Oral Q supper Rogelia Mire, NP   20 mg at 06/17/15 1628  . sodium chloride 0.9 % injection 3 mL  3 mL Intravenous Q12H Rogelia Mire, NP   3 mL at 06/18/15 2155  . sodium chloride 0.9 % injection 3 mL  3 mL Intravenous PRN Rogelia Mire, NP      . temazepam (RESTORIL) capsule 15 mg  15 mg Oral QHS Rogelia Mire, NP   15 mg at 06/18/15 2154    Physical Exam: General appearance: alert and no distress Neck: JVD - 3 cm above sternal notch and no carotid bruit Lungs: clear to auscultation bilaterally Heart: irregularly irregular rhythm Abdomen: soft, non-tender; bowel sounds normal; no masses,  no organomegaly and obese Extremities: extremities normal, atraumatic, no cyanosis or edema Pulses: 2+ and symmetric Skin: Skin color, texture, turgor normal. No rashes or lesions Neurologic: Grossly normal Psych: Affable  Lab Results: Results for orders placed or performed during the hospital encounter of 06/17/15 (from the past 48 hour(s))  Glucose, capillary     Status: Abnormal   Collection Time: 06/17/15 11:58 AM  Result Value Ref Range   Glucose-Capillary 174 (H) 65 - 99 mg/dL   Comment 1 Capillary Specimen   MRSA PCR Screening     Status: None   Collection Time: 06/17/15 12:48 PM  Result Value Ref Range   MRSA by PCR NEGATIVE NEGATIVE    Comment:        The GeneXpert MRSA Assay (FDA approved for NASAL specimens only), is one component of a comprehensive MRSA colonization surveillance program. It is not intended to diagnose MRSA infection nor to guide or monitor treatment for MRSA infections.   CBC WITH DIFFERENTIAL     Status: Abnormal   Collection Time: 06/17/15  1:25 PM  Result Value Ref Range   WBC 7.1 4.0 - 10.5 K/uL   RBC 3.99 (L) 4.22 - 5.81 MIL/uL   Hemoglobin 12.5 (L) 13.0 - 17.0 g/dL   HCT 38.7 (L) 39.0 - 52.0 %   MCV 97.0 78.0 - 100.0 fL   MCH 31.3 26.0 - 34.0 pg   MCHC 32.3 30.0 - 36.0 g/dL   RDW 13.4 11.5 - 15.5 %   Platelets 145 (L) 150  - 400 K/uL   Neutrophils Relative % 80 (H) 43 - 77 %   Neutro Abs 5.6 1.7 - 7.7 K/uL   Lymphocytes Relative 14 12 - 46 %   Lymphs Abs 1.0 0.7 - 4.0  K/uL   Monocytes Relative 6 3 - 12 %   Monocytes Absolute 0.4 0.1 - 1.0 K/uL   Eosinophils Relative 0 0 - 5 %   Eosinophils Absolute 0.0 0.0 - 0.7 K/uL   Basophils Relative 0 0 - 1 %   Basophils Absolute 0.0 0.0 - 0.1 K/uL  Comprehensive metabolic panel     Status: Abnormal   Collection Time: 06/17/15  1:25 PM  Result Value Ref Range   Sodium 139 135 - 145 mmol/L   Potassium 4.8 3.5 - 5.1 mmol/L   Chloride 105 101 - 111 mmol/L   CO2 28 22 - 32 mmol/L   Glucose, Bld 199 (H) 65 - 99 mg/dL   BUN 20 6 - 20 mg/dL   Creatinine, Ser 1.20 0.61 - 1.24 mg/dL   Calcium 9.0 8.9 - 10.3 mg/dL   Total Protein 7.1 6.5 - 8.1 g/dL   Albumin 3.8 3.5 - 5.0 g/dL   AST 44 (H) 15 - 41 U/L   ALT 31 17 - 63 U/L   Alkaline Phosphatase 54 38 - 126 U/L   Total Bilirubin 1.1 0.3 - 1.2 mg/dL   GFR calc non Af Amer 59 (L) >60 mL/min   GFR calc Af Amer >60 >60 mL/min    Comment: (NOTE) The eGFR has been calculated using the CKD EPI equation. This calculation has not been validated in all clinical situations. eGFR's persistently <60 mL/min signify possible Chronic Kidney Disease.    Anion gap 6 5 - 15  Troponin I     Status: Abnormal   Collection Time: 06/17/15  1:25 PM  Result Value Ref Range   Troponin I 0.04 (H) <0.031 ng/mL    Comment:        PERSISTENTLY INCREASED TROPONIN VALUES IN THE RANGE OF 0.04-0.49 ng/mL CAN BE SEEN IN:       -UNSTABLE ANGINA       -CONGESTIVE HEART FAILURE       -MYOCARDITIS       -CHEST TRAUMA       -ARRYHTHMIAS       -LATE PRESENTING MYOCARDIAL INFARCTION       -COPD   CLINICAL FOLLOW-UP RECOMMENDED.   TSH     Status: None   Collection Time: 06/17/15  1:25 PM  Result Value Ref Range   TSH 1.085 0.350 - 4.500 uIU/mL  Glucose, capillary     Status: Abnormal   Collection Time: 06/17/15  4:26 PM  Result Value Ref  Range   Glucose-Capillary 225 (H) 65 - 99 mg/dL   Comment 1 Capillary Specimen    Comment 2 Notify RN   Troponin I     Status: Abnormal   Collection Time: 06/17/15  6:35 PM  Result Value Ref Range   Troponin I 0.04 (H) <0.031 ng/mL    Comment:        PERSISTENTLY INCREASED TROPONIN VALUES IN THE RANGE OF 0.04-0.49 ng/mL CAN BE SEEN IN:       -UNSTABLE ANGINA       -CONGESTIVE HEART FAILURE       -MYOCARDITIS       -CHEST TRAUMA       -ARRYHTHMIAS       -LATE PRESENTING MYOCARDIAL INFARCTION       -COPD   CLINICAL FOLLOW-UP RECOMMENDED.   Glucose, capillary     Status: Abnormal   Collection Time: 06/17/15  9:53 PM  Result Value Ref Range   Glucose-Capillary 226 (H) 65 - 99 mg/dL  Comment 1 Capillary Specimen   Troponin I     Status: Abnormal   Collection Time: 06/18/15 12:40 AM  Result Value Ref Range   Troponin I 0.04 (H) <0.031 ng/mL    Comment:        PERSISTENTLY INCREASED TROPONIN VALUES IN THE RANGE OF 0.04-0.49 ng/mL CAN BE SEEN IN:       -UNSTABLE ANGINA       -CONGESTIVE HEART FAILURE       -MYOCARDITIS       -CHEST TRAUMA       -ARRYHTHMIAS       -LATE PRESENTING MYOCARDIAL INFARCTION       -COPD   CLINICAL FOLLOW-UP RECOMMENDED.   Basic metabolic panel     Status: Abnormal   Collection Time: 06/18/15 12:40 AM  Result Value Ref Range   Sodium 137 135 - 145 mmol/L   Potassium 4.0 3.5 - 5.1 mmol/L   Chloride 101 101 - 111 mmol/L   CO2 27 22 - 32 mmol/L   Glucose, Bld 255 (H) 65 - 99 mg/dL   BUN 22 (H) 6 - 20 mg/dL   Creatinine, Ser 1.20 0.61 - 1.24 mg/dL   Calcium 8.5 (L) 8.9 - 10.3 mg/dL   GFR calc non Af Amer 59 (L) >60 mL/min   GFR calc Af Amer >60 >60 mL/min    Comment: (NOTE) The eGFR has been calculated using the CKD EPI equation. This calculation has not been validated in all clinical situations. eGFR's persistently <60 mL/min signify possible Chronic Kidney Disease.    Anion gap 9 5 - 15  Glucose, capillary     Status: Abnormal    Collection Time: 06/18/15  8:57 AM  Result Value Ref Range   Glucose-Capillary 282 (H) 65 - 99 mg/dL   Comment 1 Capillary Specimen   Glucose, capillary     Status: Abnormal   Collection Time: 06/18/15 12:04 PM  Result Value Ref Range   Glucose-Capillary 306 (H) 65 - 99 mg/dL   Comment 1 Capillary Specimen   Glucose, capillary     Status: Abnormal   Collection Time: 06/18/15  3:31 PM  Result Value Ref Range   Glucose-Capillary 311 (H) 65 - 99 mg/dL   Comment 1 Capillary Specimen   Glucose, capillary     Status: Abnormal   Collection Time: 06/18/15  9:36 PM  Result Value Ref Range   Glucose-Capillary 301 (H) 65 - 99 mg/dL   Comment 1 Capillary Specimen   Basic metabolic panel     Status: Abnormal   Collection Time: 06/19/15  3:10 AM  Result Value Ref Range   Sodium 135 135 - 145 mmol/L   Potassium 4.3 3.5 - 5.1 mmol/L   Chloride 99 (L) 101 - 111 mmol/L   CO2 26 22 - 32 mmol/L   Glucose, Bld 232 (H) 65 - 99 mg/dL   BUN 34 (H) 6 - 20 mg/dL   Creatinine, Ser 3.05 (H) 0.61 - 1.24 mg/dL    Comment: DELTA CHECK NOTED   Calcium 8.5 (L) 8.9 - 10.3 mg/dL   GFR calc non Af Amer 19 (L) >60 mL/min   GFR calc Af Amer 22 (L) >60 mL/min    Comment: (NOTE) The eGFR has been calculated using the CKD EPI equation. This calculation has not been validated in all clinical situations. eGFR's persistently <60 mL/min signify possible Chronic Kidney Disease.    Anion gap 10 5 - 15  Glucose, capillary     Status:  Abnormal   Collection Time: 06/19/15  8:02 AM  Result Value Ref Range   Glucose-Capillary 208 (H) 65 - 99 mg/dL   Comment 1 Capillary Specimen     Imaging: Dg Chest 2 View  06/18/2015   CLINICAL DATA:  Shortness of breath for 1 day  EXAM: CHEST  2 VIEW  COMPARISON:  12/30/2013  FINDINGS: Moderate enlargement of the cardiomediastinal silhouette is noted. Central vascular congestion without focal edema or pulmonary opacity. No pleural effusion. No acute osseous abnormality.  IMPRESSION:  Cardiomegaly without focal acute finding.   Electronically Signed   By: Conchita Paris M.D.   On: 06/18/2015 18:36    Assessment:  Principal Problem:   Atrial flutter with rapid ventricular response Active Problems:   Diabetes mellitus   Essential hypertension   Mixed hyperlipidemia   Acute on chronic diastolic CHF (congestive heart failure), NYHA class 1   Plan:  1. Diuresed about 2.5L negative, however, creatinine has acutely risen to 3.05 from 1.2.  Bladder scan shows 185 cc urine, will place foley. Hold additional diuresis. Post-procedure BP is soft, will give back 500 cc IVF now. Will ask nephrology to evaluate today. Plan for esophagram later today if he is able to drink the contrast - hold po medications until esophagus is known to patent. On xarelto for anticoagulation - CHADSVASC 3.  Time Spent Directly with Patient:  15 minutes  Length of Stay:  LOS: 2 days   Pixie Casino, MD, [facility] Attending Cardiologist [place] 06/19/2015, 11:12 AM

## 2015-06-19 NOTE — Anesthesia Postprocedure Evaluation (Signed)
  Anesthesia Post-op Note  Patient: Thomas [name]  Procedure(s) Performed: Procedure(s): TRANSESOPHAGEAL ECHOCARDIOGRAM (TEE) (N/A) CARDIOVERSION (N/A) CANCELLED PROCEDURE  Patient Location: Endoscopy Unit  Anesthesia Type:MAC  Level of Consciousness: awake, patient cooperative and responds to stimulation  Airway and Oxygen Therapy: Patient Spontanous Breathing and Patient connected to face mask oxygen  Post-op Pain: none  Post-op Assessment: Post-op Vital signs reviewed, Patient's Cardiovascular Status Stable, Respiratory Function Stable, Patent Airway and No signs of Nausea or vomiting              Post-op Vital Signs: Reviewed and stable  Last Vitals:  Filed Vitals:   06/19/15 0859  BP: 101/81  Pulse:   Temp:   Resp: 16    Complications: No apparent anesthesia complications

## 2015-06-19 NOTE — Transfer of Care (Signed)
Immediate Anesthesia Transfer of Care Note  Patient: Thomas [name]  Procedure(s) Performed: Procedure(s): TRANSESOPHAGEAL ECHOCARDIOGRAM (TEE) (N/A) CARDIOVERSION (N/A) CANCELLED PROCEDURE  Patient Location: Endoscopy Unit  Anesthesia Type:MAC  Level of Consciousness: awake, patient cooperative and responds to stimulation  Airway & Oxygen Therapy: Patient Spontanous Breathing and Patient connected to face mask oxygen  Post-op Assessment: Report given to RN and Post -op Vital signs reviewed and stable  Post vital signs: Reviewed and stable  Last Vitals:  Filed Vitals:   06/19/15 0859  BP: 101/81  Pulse:   Temp:   Resp: 16    Complications: No apparent anesthesia complications

## 2015-06-20 ENCOUNTER — Inpatient Hospital Stay (HOSPITAL_COMMUNITY): Payer: Medicare Other

## 2015-06-20 DIAGNOSIS — K5900 Constipation, unspecified: Secondary | ICD-10-CM | POA: Diagnosis present

## 2015-06-20 DIAGNOSIS — Z9989 Dependence on other enabling machines and devices: Secondary | ICD-10-CM

## 2015-06-20 DIAGNOSIS — G4733 Obstructive sleep apnea (adult) (pediatric): Secondary | ICD-10-CM | POA: Diagnosis present

## 2015-06-20 LAB — RENAL FUNCTION PANEL
ALBUMIN: 3.7 g/dL (ref 3.5–5.0)
Anion gap: 11 (ref 5–15)
BUN: 48 mg/dL — AB (ref 6–20)
CO2: 27 mmol/L (ref 22–32)
Calcium: 8.7 mg/dL — ABNORMAL LOW (ref 8.9–10.3)
Chloride: 101 mmol/L (ref 101–111)
Creatinine, Ser: 2.68 mg/dL — ABNORMAL HIGH (ref 0.61–1.24)
GFR calc Af Amer: 26 mL/min — ABNORMAL LOW (ref >60)
GFR calc non Af Amer: 22 mL/min — ABNORMAL LOW (ref >60)
GLUCOSE: 65 mg/dL (ref 65–99)
PHOSPHORUS: 4.7 mg/dL — AB (ref 2.5–4.6)
POTASSIUM: 3.7 mmol/L (ref 3.5–5.1)
Sodium: 139 mmol/L (ref 135–145)

## 2015-06-20 LAB — GLUCOSE, CAPILLARY
Glucose-Capillary: 51 mg/dL — ABNORMAL LOW (ref 65–99)
Glucose-Capillary: 66 mg/dL (ref 65–99)
Glucose-Capillary: 112 mg/dL — ABNORMAL HIGH (ref 65–99)
Glucose-Capillary: 113 mg/dL — ABNORMAL HIGH (ref 65–99)
Glucose-Capillary: 128 mg/dL — ABNORMAL HIGH (ref 65–99)

## 2015-06-20 LAB — PROTEIN ELECTROPHORESIS, SERUM
A/G Ratio: 1 (ref 0.7–1.7)
ALPHA-2-GLOBULIN: 0.9 g/dL (ref 0.4–1.0)
Albumin ELP: 3.3 g/dL (ref 2.9–4.4)
Alpha-1-Globulin: 0.3 g/dL (ref 0.0–0.4)
Beta Globulin: 1.2 g/dL (ref 0.7–1.3)
GLOBULIN, TOTAL: 3.3 g/dL (ref 2.2–3.9)
Gamma Globulin: 0.9 g/dL (ref 0.4–1.8)
TOTAL PROTEIN ELP: 6.6 g/dL (ref 6.0–8.5)

## 2015-06-20 MED ORDER — LEVALBUTEROL HCL 0.63 MG/3ML IN NEBU
0.6300 mg | INHALATION_SOLUTION | Freq: Three times a day (TID) | RESPIRATORY_TRACT | Status: DC
  Administered 2015-06-20 (×2): 0.63 mg via RESPIRATORY_TRACT
  Filled 2015-06-20 (×2): qty 3

## 2015-06-20 MED ORDER — POLYETHYLENE GLYCOL 3350 17 G PO PACK
17.0000 g | PACK | Freq: Once | ORAL | Status: AC
  Administered 2015-06-20: 17 g via ORAL
  Filled 2015-06-20: qty 1

## 2015-06-20 MED ORDER — INSULIN REGULAR HUMAN (CONC) 500 UNIT/ML ~~LOC~~ SOLN
100.0000 [IU] | Freq: Every day | SUBCUTANEOUS | Status: DC
  Administered 2015-06-20 – 2015-06-22 (×3): 100 [IU] via SUBCUTANEOUS

## 2015-06-20 MED ORDER — POLYETHYLENE GLYCOL 3350 17 GM/SCOOP PO POWD
1.0000 | Freq: Once | ORAL | Status: DC
  Filled 2015-06-20: qty 255

## 2015-06-20 MED ORDER — MORPHINE SULFATE (PF) 2 MG/ML IV SOLN
2.0000 mg | INTRAVENOUS | Status: AC
  Administered 2015-06-20: 2 mg via INTRAVENOUS

## 2015-06-20 MED ORDER — INSULIN REGULAR HUMAN (CONC) 500 UNIT/ML ~~LOC~~ SOLN
50.0000 [IU] | Freq: Every day | SUBCUTANEOUS | Status: DC
  Administered 2015-06-20: 50 [IU] via SUBCUTANEOUS
  Administered 2015-06-21: 30 [IU] via SUBCUTANEOUS
  Administered 2015-06-23: 50 [IU] via SUBCUTANEOUS
  Filled 2015-06-20: qty 20

## 2015-06-20 MED ORDER — SIMETHICONE 80 MG PO CHEW
80.0000 mg | CHEWABLE_TABLET | Freq: Four times a day (QID) | ORAL | Status: DC | PRN
  Administered 2015-06-20 – 2015-06-22 (×2): 80 mg via ORAL
  Filled 2015-06-20 (×2): qty 1

## 2015-06-20 MED ORDER — INSULIN REGULAR HUMAN (CONC) 500 UNIT/ML ~~LOC~~ SOLN: 100.0000 [IU] | Freq: Every day | SUBCUTANEOUS | Status: DC

## 2015-06-20 MED ORDER — DEXTROSE-NACL 5-0.9 % IV SOLN: INTRAVENOUS | Status: DC

## 2015-06-20 NOTE — Care Management Important Message (Signed)
Important Message  Patient Details  Name: Thomas [name] MRN: [id number] Date of Birth: 11/30/1940   Medicare Important Message Given:  Yes-second notification given    Orson Aloe 06/20/2015, 12:03 PM

## 2015-06-20 NOTE — Progress Notes (Signed)
Subjective:  C/o abdominal pain this AM- thought to be constipated - UOP seems better and creatinine is down- BP is higher as well. Ultrasound was negative for obstruction- going to re attempt TEE/cardioversion today.  Urine in foley is bloody- said catheter got tugged Objective Vital signs in last 24 hours: Filed Vitals:   06/20/15 0615 06/20/15 0700 06/20/15 0727 06/20/15 0800  BP:  138/50  130/80  Pulse:  85  85  Temp:   99.7 F (37.6 C)   TempSrc:   Oral   Resp:  18  15  Height:      Weight: 100.6 kg (221 lb 12.5 oz)     SpO2:  100%  96%   Weight change: 0.8 kg (1 lb 12.2 oz)  Intake/Output Summary (Last 24 hours) at 06/20/15 0954 Last data filed at 06/20/15 0800  Gross per 24 hour  Intake    460 ml  Output    975 ml  Net   -515 ml    Assessment/ Plan: Pt is a 74 y.o. yo male wit multiple medical problems who was admitted on 06/17/2015 with Afib and CHF- developed AKI in house in the setting of diuresis and hypotension on an ACE-I  Assessment/Plan: 1. Renal- AKI in the setting of above I think mostly ATN due to hypotension on lisinopril and possibly some BOO.  Renal ultrasound does not indicate outright obstruction. CK does not appear to be an issue- SPEP/UPEP pending.  BP is better- lisinopril on hold but on a number of other meds and also pain/anxiety meds which can lower blood pressure as well.  Need to watch for hypotension 2. CHF-  Still a little overloaded but would hold lasix at least another 24 hours 3. Afib- for TEE/cardioversion eventually - on amiodarone/cardizem/toprol but BP is OK 4. Anemia- not a significant issue 5. Gross hematuria- I think will clear- u/s was fine    Beatrice Sehgal A    Labs: Basic Metabolic Panel:  Recent Labs Lab 06/19/15 0310 06/19/15 1440 06/20/15 0404  NA 135 138 139  K 4.3 4.2 3.7  CL 99* 100* 101  CO2 26 27 27   GLUCOSE 232* 142* 65  BUN 34* 40* 48*  CREATININE 3.05* 3.78* 2.68*  CALCIUM 8.5* 8.6* 8.7*  PHOS  --   5.9* 4.7*   Liver Function Tests:  Recent Labs Lab 06/17/15 1325 06/19/15 1440 06/20/15 0404  AST 44*  --   --   ALT 31  --   --   ALKPHOS 54  --   --   BILITOT 1.1  --   --   PROT 7.1  --   --   ALBUMIN 3.8 3.8 3.7   No results for input(s): LIPASE, AMYLASE in the last 168 hours. No results for input(s): AMMONIA in the last 168 hours. CBC:  Recent Labs Lab 06/17/15 1325  WBC 7.1  NEUTROABS 5.6  HGB 12.5*  HCT 38.7*  MCV 97.0  PLT 145*   Cardiac Enzymes:  Recent Labs Lab 06/17/15 1325 06/17/15 1835 06/18/15 0040 06/19/15 1440  CKTOTAL  --   --   --  286  TROPONINI 0.04* 0.04* 0.04*  --    CBG:  Recent Labs Lab 06/19/15 1730 06/19/15 2121 06/20/15 0729 06/20/15 0752 06/20/15 0838  GLUCAP 112* 117* 51* 66 112*    Iron Studies: No results for input(s): IRON, TIBC, TRANSFERRIN, FERRITIN in the last 72 hours. Studies/Results: Dg Chest 2 View  06/18/2015   CLINICAL DATA:  Shortness of  breath for 1 day  EXAM: CHEST  2 VIEW  COMPARISON:  12/30/2013  FINDINGS: Moderate enlargement of the cardiomediastinal silhouette is noted. Central vascular congestion without focal edema or pulmonary opacity. No pleural effusion. No acute osseous abnormality.  IMPRESSION: Cardiomegaly without focal acute finding.   Electronically Signed   By: Christiana Pellant M.D.   On: 06/18/2015 18:36   Dg Esophagus  06/19/2015   CLINICAL DATA:  74 year old inpatient male, unable to pass transesophageal echocardiogram probe earlier today. Query diverticulum. Initial encounter.  EXAM: ESOPHOGRAM/BARIUM SWALLOW  TECHNIQUE: Single contrast examination was performed using thin barium or water soluble.  FLUOROSCOPY TIME:  Radiation Exposure Index (as provided by the fluoroscopic device):  If the device does not provide the exposure index:  Fluoroscopy Time:  1 minutes 24 seconds  Number of Acquired Images:  0  COMPARISON:  Chest CT 11/24/2006.  FINDINGS: Limited patient mobility. Study performed with the  patient supine on the plus fluoroscopy table inclined at 25 degrees.  He tolerated the study without difficulty.  No obstruction to the forward flow of contrast throughout the esophagus and into the stomach.  There is a somewhat tortuous appearance of the upper cervical esophagus best seen on series 1, image 55. Beyond that, frequent tertiary contractions and mildly tortuous appearance of the mid thoracic esophagus is noted. No evidence of esophageal diverticulum. Gastroesophageal junction within normal limits.  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   Electronically Signed   By: Odessa Fleming M.D.   On: 06/19/2015 13:12   US Renal  06/19/2015   CLINICAL DATA:  Acute renal failure  EXAM: RENAL ULTRASOUND  COMPARISON:  December 08, 2010  FINDINGS: Right Kidney:  Length: 14.3 cm. Echogenicity and renal echogenicity are within normal limits. No mass, perinephric fluid, or hydronephrosis visualized. No sonographically demonstrable calculus or ureterectasis.  Left Kidney:  Length: 13.6 cm. Echogenicity and renal cortical thickness are within normal limits. No perinephric fluid or hydronephrosis visualized. There is a stable 1.7 x 1.5 cm lower pole renal cyst. No other mass seen. No sonographically demonstrable calculus or ureterectasis.  Bladder:  Decompressed with Foley catheter and cannot be assessed.  IMPRESSION: Stable small cyst lower pole left kidney. Kidney is otherwise appear unremarkable and stable.   Electronically Signed   By: Bretta Bang III M.D.   On: 06/19/2015 15:37   Medications: Infusions: . dextrose 5 % and 0.9% NaCl    . nitroGLYCERIN Stopped (06/18/15 1200)    Scheduled Medications: . amiodarone  400 mg Oral Daily  . antiseptic oral rinse  7 mL Mouth Rinse BID  . atorvastatin  20 mg Oral Daily  . diltiazem  60 mg Oral QID  . gabapentin  300 mg Oral TID  . Influenza vac  split quadrivalent PF  0.5 mL Intramuscular Tomorrow-1000  . insulin aspart  0-15 Units Subcutaneous TID WC  . insulin regular human CONCENTRATED  100 Units Subcutaneous Q breakfast  . insulin regular human CONCENTRATED  50 Units Subcutaneous QHS  . metoprolol succinate  75 mg Oral Daily  . polyethylene glycol powder  1 Container Oral Once  . rivaroxaban  15 mg Oral Q supper  . sodium chloride  3 mL Intravenous Q12H  . temazepam  15 mg Oral QHS    have reviewed scheduled and prn medications.  Physical Exam: General: alert, talkative Heart: irreg Lungs: mostly clear Abdomen: obese, non tender Extremities: pitting edema  06/20/2015,9:54 AM  LOS: 3 days

## 2015-06-20 NOTE — Progress Notes (Addendum)
No availability for cardioversion today - scheduled for 2:00 pm Monday. Cancel NPO and D5NS. Scheduled nebulizers.  Chrystie Nose, MD, [facility] Attending Cardiologist [facility]

## 2015-06-20 NOTE — Progress Notes (Addendum)
DAILY PROGRESS NOTE  Subjective:  Still with shortness of breath today. Responded to nebulizer overnight. C/o abdominal pain and distention - 2 small BM's yesterday. Not eating well. Renal function appears to be improving (appreciate nephrology input). Renal ultrasound does not show any significant pathology. Also noted to be hypoglycemic today - on home insulin regimen, but not eating well - will need to adjust. He mentioned that he also has OSA, which was not on his problem list and is on CPAP at home with autotitration.  Objective:  Temp:  [98 F (36.7 C)-99.7 F (37.6 C)] 99.7 F (37.6 C) (09/09 0727) Pulse Rate:  [45-133] 85 (09/09 0800) Resp:  [12-24] 15 (09/09 0800) BP: (86-149)/(26-86) 130/80 mmHg (09/09 0800) SpO2:  [90 %-100 %] 96 % (09/09 0800) FiO2 (%):  [95 %] 95 % (09/08 1040) Weight:  [221 lb 12.5 oz (100.6 kg)] 221 lb 12.5 oz (100.6 kg) (09/09 0615) Weight change: 1 lb 12.2 oz (0.8 kg)  Intake/Output from previous day: 09/08 0701 - 09/09 0700 In: 460 [P.O.:360; I.V.:100] Out: 775 [Urine:775]  Intake/Output from this shift: Total I/O In: -  Out: 200 [Urine:200]  Medications: Current Facility-Administered Medications  Medication Dose Route Frequency Provider Last Rate Last Dose  . 0.9 %  sodium chloride infusion  250 mL Intravenous PRN Ok Anis, NP      . acetaminophen (TYLENOL) tablet 650 mg  650 mg Oral Q4H PRN Ok Anis, NP      . amiodarone (PACERONE) tablet 400 mg  400 mg Oral Daily Chrystie Nose, MD   400 mg at 06/18/15 1002  . antiseptic oral rinse (CPC / CETYLPYRIDINIUM CHLORIDE 0.05%) solution 7 mL  7 mL Mouth Rinse BID Marinus Maw, MD   7 mL at 06/19/15 2200  . atorvastatin (LIPITOR) tablet 20 mg  20 mg Oral Daily Ok Anis, NP   20 mg at 06/18/15 1002  . diltiazem (CARDIZEM) tablet 60 mg  60 mg Oral QID Chrystie Nose, MD   60 mg at 06/20/15 4544  . gabapentin (NEURONTIN) capsule 300 mg  300 mg Oral TID  Ok Anis, NP   300 mg at 06/19/15 2121  . Influenza vac split quadrivalent PF (FLUARIX) injection 0.5 mL  0.5 mL Intramuscular Tomorrow-1000 Marinus Maw, MD   0.5 mL at 06/18/15 1014  . insulin aspart (novoLOG) injection 0-15 Units  0-15 Units Subcutaneous TID WC Ok Anis, NP   11 Units at 06/18/15 1611  . insulin regular human CONCENTRATED (HUMULIN R) 500 UNIT/ML injection 140 Units  140 Units Subcutaneous Q breakfast Marinus Maw, MD   140 Units at 06/19/15 0800  . insulin regular human CONCENTRATED (HUMULIN R) 500 UNIT/ML injection 60 Units  60 Units Subcutaneous QHS Marinus Maw, MD   60 Units at 06/19/15 2300  . levalbuterol (XOPENEX) nebulizer solution 0.63 mg  0.63 mg Nebulization Q4H PRN Chrystie Nose, MD   0.63 mg at 06/19/15 1737  . LORazepam (ATIVAN) tablet 0.5 mg  0.5 mg Oral Q6H PRN Chrystie Nose, MD   0.5 mg at 06/19/15 0059  . metoprolol succinate (TOPROL-XL) 24 hr tablet 75 mg  75 mg Oral Daily Chrystie Nose, MD   75 mg at 06/18/15 1001  . morphine 2 MG/ML injection 2 mg  2 mg Intravenous Q2H PRN Chrystie Nose, MD   2 mg at 06/19/15 2121  . nitroGLYCERIN (NITROSTAT) SL tablet 0.4 mg  0.4  mg Sublingual Q5 min PRN Rogelia Mire, NP      . nitroGLYCERIN 50 mg in dextrose 5 % 250 mL (0.2 mg/mL) infusion  0-200 mcg/min Intravenous Titrated Dorothy Spark, MD   Stopped at 06/18/15 1200  . ondansetron (ZOFRAN) injection 4 mg  4 mg Intravenous Q6H PRN Rogelia Mire, NP      . oxyCODONE-acetaminophen (PERCOCET/ROXICET) 5-325 MG per tablet 1 tablet  1 tablet Oral BID PRN Pixie Casino, MD   1 tablet at 06/20/15 0802  . Rivaroxaban (XARELTO) tablet 15 mg  15 mg Oral Q supper Evans Lance, MD   15 mg at 06/19/15 1629  . sodium chloride 0.9 % injection 3 mL  3 mL Intravenous Q12H Rogelia Mire, NP   3 mL at 06/19/15 2123  . sodium chloride 0.9 % injection 3 mL  3 mL Intravenous PRN Rogelia Mire, NP      . temazepam (RESTORIL)  capsule 15 mg  15 mg Oral QHS Rogelia Mire, NP   15 mg at 06/19/15 2121    Physical Exam: General appearance: alert, mild distress and morbidly obese Neck: no carotid bruit and no JVD Lungs: diminished breath sounds bilaterally and wheezes bilaterally Heart: irregularly irregular rhythm Abdomen: distended, mildly tympanitic Extremities: extremities normal, atraumatic, no cyanosis or edema Pulses: 2+ and symmetric Skin: Skin color, texture, turgor normal. No rashes or lesions Neurologic: Grossly normal Psych: Irritated  Lab Results: Results for orders placed or performed during the hospital encounter of 06/17/15 (from the past 48 hour(s))  Glucose, capillary     Status: Abnormal   Collection Time: 06/18/15 12:04 PM  Result Value Ref Range   Glucose-Capillary 306 (H) 65 - 99 mg/dL   Comment 1 Capillary Specimen   Glucose, capillary     Status: Abnormal   Collection Time: 06/18/15  3:31 PM  Result Value Ref Range   Glucose-Capillary 311 (H) 65 - 99 mg/dL   Comment 1 Capillary Specimen   Glucose, capillary     Status: Abnormal   Collection Time: 06/18/15  9:36 PM  Result Value Ref Range   Glucose-Capillary 301 (H) 65 - 99 mg/dL   Comment 1 Capillary Specimen   Basic metabolic panel     Status: Abnormal   Collection Time: 06/19/15  3:10 AM  Result Value Ref Range   Sodium 135 135 - 145 mmol/L   Potassium 4.3 3.5 - 5.1 mmol/L   Chloride 99 (L) 101 - 111 mmol/L   CO2 26 22 - 32 mmol/L   Glucose, Bld 232 (H) 65 - 99 mg/dL   BUN 34 (H) 6 - 20 mg/dL   Creatinine, Ser 3.05 (H) 0.61 - 1.24 mg/dL    Comment: DELTA CHECK NOTED   Calcium 8.5 (L) 8.9 - 10.3 mg/dL   GFR calc non Af Amer 19 (L) >60 mL/min   GFR calc Af Amer 22 (L) >60 mL/min    Comment: (NOTE) The eGFR has been calculated using the CKD EPI equation. This calculation has not been validated in all clinical situations. eGFR's persistently <60 mL/min signify possible Chronic Kidney Disease.    Anion gap 10 5 - 15   Glucose, capillary     Status: Abnormal   Collection Time: 06/19/15  8:02 AM  Result Value Ref Range   Glucose-Capillary 208 (H) 65 - 99 mg/dL   Comment 1 Capillary Specimen   Glucose, capillary     Status: Abnormal   Collection Time: 06/19/15 11:16  AM  Result Value Ref Range   Glucose-Capillary 160 (H) 65 - 99 mg/dL   Comment 1 Capillary Specimen   Sodium, urine, random     Status: None   Collection Time: 06/19/15  1:48 PM  Result Value Ref Range   Sodium, Ur 54 mmol/L  Creatinine, urine, random     Status: None   Collection Time: 06/19/15  1:48 PM  Result Value Ref Range   Creatinine, Urine 82.73 mg/dL  Urinalysis, Routine w reflex microscopic (not at [facility])     Status: Abnormal   Collection Time: 06/19/15  1:48 PM  Result Value Ref Range   Color, Urine YELLOW YELLOW   APPearance HAZY (A) CLEAR   Specific Gravity, Urine 1.015 1.005 - 1.030   pH 5.0 5.0 - 8.0   Glucose, UA 500 (A) NEGATIVE mg/dL   Hgb urine dipstick SMALL (A) NEGATIVE   Bilirubin Urine NEGATIVE NEGATIVE   Ketones, ur NEGATIVE NEGATIVE mg/dL   Protein, ur >300 (A) NEGATIVE mg/dL   Urobilinogen, UA 0.2 0.0 - 1.0 mg/dL   Nitrite NEGATIVE NEGATIVE   Leukocytes, UA NEGATIVE NEGATIVE  Urine microscopic-add on     Status: Abnormal   Collection Time: 06/19/15  1:48 PM  Result Value Ref Range   Squamous Epithelial / LPF FEW (A) RARE   WBC, UA 0-2 <3 WBC/hpf   RBC / HPF 3-6 <3 RBC/hpf   Bacteria, UA FEW (A) RARE   Casts GRANULAR CAST (A) NEGATIVE   Urine-Other AMORPHOUS URATES/PHOSPHATES   Renal function panel     Status: Abnormal   Collection Time: 06/19/15  2:40 PM  Result Value Ref Range   Sodium 138 135 - 145 mmol/L   Potassium 4.2 3.5 - 5.1 mmol/L   Chloride 100 (L) 101 - 111 mmol/L   CO2 27 22 - 32 mmol/L   Glucose, Bld 142 (H) 65 - 99 mg/dL   BUN 40 (H) 6 - 20 mg/dL   Creatinine, Ser 3.78 (H) 0.61 - 1.24 mg/dL   Calcium 8.6 (L) 8.9 - 10.3 mg/dL   Phosphorus 5.9 (H) 2.5 - 4.6 mg/dL   Albumin 3.8  3.5 - 5.0 g/dL   GFR calc non Af Amer 15 (L) >60 mL/min   GFR calc Af Amer 17 (L) >60 mL/min    Comment: (NOTE) The eGFR has been calculated using the CKD EPI equation. This calculation has not been validated in all clinical situations. eGFR's persistently <60 mL/min signify possible Chronic Kidney Disease.    Anion gap 11 5 - 15  CK     Status: None   Collection Time: 06/19/15  2:40 PM  Result Value Ref Range   Total CK 286 49 - 397 U/L  Glucose, capillary     Status: Abnormal   Collection Time: 06/19/15  5:30 PM  Result Value Ref Range   Glucose-Capillary 112 (H) 65 - 99 mg/dL   Comment 1 Capillary Specimen   Glucose, capillary     Status: Abnormal   Collection Time: 06/19/15  9:21 PM  Result Value Ref Range   Glucose-Capillary 117 (H) 65 - 99 mg/dL   Comment 1 Capillary Specimen   Renal function panel     Status: Abnormal   Collection Time: 06/20/15  4:04 AM  Result Value Ref Range   Sodium 139 135 - 145 mmol/L   Potassium 3.7 3.5 - 5.1 mmol/L   Chloride 101 101 - 111 mmol/L   CO2 27 22 - 32 mmol/L   Glucose,  Bld 65 65 - 99 mg/dL   BUN 48 (H) 6 - 20 mg/dL   Creatinine, Ser 2.68 (H) 0.61 - 1.24 mg/dL    Comment: DELTA CHECK NOTED   Calcium 8.7 (L) 8.9 - 10.3 mg/dL   Phosphorus 4.7 (H) 2.5 - 4.6 mg/dL   Albumin 3.7 3.5 - 5.0 g/dL   GFR calc non Af Amer 22 (L) >60 mL/min   GFR calc Af Amer 26 (L) >60 mL/min    Comment: (NOTE) The eGFR has been calculated using the CKD EPI equation. This calculation has not been validated in all clinical situations. eGFR's persistently <60 mL/min signify possible Chronic Kidney Disease.    Anion gap 11 5 - 15  Glucose, capillary     Status: Abnormal   Collection Time: 06/20/15  7:29 AM  Result Value Ref Range   Glucose-Capillary 51 (L) 65 - 99 mg/dL  Glucose, capillary     Status: None   Collection Time: 06/20/15  7:52 AM  Result Value Ref Range   Glucose-Capillary 66 65 - 99 mg/dL  Glucose, capillary     Status: Abnormal    Collection Time: 06/20/15  8:38 AM  Result Value Ref Range   Glucose-Capillary 112 (H) 65 - 99 mg/dL   Comment 1 Capillary Specimen     Imaging: Dg Chest 2 View  06/18/2015   CLINICAL DATA:  Shortness of breath for 1 day  EXAM: CHEST  2 VIEW  COMPARISON:  12/30/2013  FINDINGS: Moderate enlargement of the cardiomediastinal silhouette is noted. Central vascular congestion without focal edema or pulmonary opacity. No pleural effusion. No acute osseous abnormality.  IMPRESSION: Cardiomegaly without focal acute finding.   Electronically Signed   By: Conchita Paris M.D.   On: 06/18/2015 18:36   Dg Esophagus  06/19/2015   CLINICAL DATA:  74 year old inpatient male, unable to pass transesophageal echocardiogram probe earlier today. Query diverticulum. Initial encounter.  EXAM: ESOPHOGRAM/BARIUM SWALLOW  TECHNIQUE: Single contrast examination was performed using thin barium or water soluble.  FLUOROSCOPY TIME:  Radiation Exposure Index (as provided by the fluoroscopic device):  If the device does not provide the exposure index:  Fluoroscopy Time:  1 minutes 24 seconds  Number of Acquired Images:  0  COMPARISON:  Chest CT 11/24/2006.  FINDINGS: Limited patient mobility. Study performed with the patient supine on the plus fluoroscopy table inclined at 25 degrees.  He tolerated the study without difficulty.  No obstruction to the forward flow of contrast throughout the esophagus and into the stomach.  There is a somewhat tortuous appearance of the upper cervical esophagus best seen on series 1, image 55. Beyond that, frequent tertiary contractions and mildly tortuous appearance of the mid thoracic esophagus is noted. No evidence of esophageal diverticulum. Gastroesophageal junction within normal limits.  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   Electronically Signed   By: Genevie Ann M.D.   On: 06/19/2015  13:12   US Renal  06/19/2015   CLINICAL DATA:  Acute renal failure  EXAM: RENAL ULTRASOUND  COMPARISON:  December 08, 2010  FINDINGS: Right Kidney:  Length: 14.3 cm. Echogenicity and renal echogenicity are within normal limits. No mass, perinephric fluid, or hydronephrosis visualized. No sonographically demonstrable calculus or ureterectasis.  Left Kidney:  Length: 13.6 cm. Echogenicity and renal cortical thickness are within normal limits. No perinephric fluid or hydronephrosis visualized. There is a stable 1.7 x 1.5 cm lower pole renal  cyst. No other mass seen. No sonographically demonstrable calculus or ureterectasis.  Bladder:  Decompressed with Foley catheter and cannot be assessed.  IMPRESSION: Stable small cyst lower pole left kidney. Kidney is otherwise appear unremarkable and stable.   Electronically Signed   By: Lowella Grip III M.D.   On: 06/19/2015 15:37    Assessment:  Principal Problem:   Atrial flutter with rapid ventricular response Active Problems:   Diabetes mellitus   Essential hypertension   Mixed hyperlipidemia   Acute on chronic diastolic CHF (congestive heart failure), NYHA class 1   Acute renal failure   OSA on CPAP   Constipation   Plan:  1. Mr. Odor continues to have numerous complaints. Today he has abdominal pain and it appears he is likely constipated/obstipated. Will Rx for miralax. Remains in a-fib which is rate-controlled. Esophagram yesterday shows no diverticulum. Renal function is improving with foley in place - U/S shows no significant pathology. I think we need to re-attempt TEE/Cardioversion today. Will d/w endoscopy regarding availability. Will order scheduled nebulizers. He informed me he has OSA and was on CPAP at home with auto-titration. Will order him to use that tonight.  Will adjust insulin regimen down due to decreased oral intake.  Time Spent Directly with Patient:  30 minutes  Length of Stay:  LOS: 3 days   Pixie Casino, MD,  [facility] Attending Cardiologist [place] 06/20/2015, 9:37 AM

## 2015-06-20 NOTE — Progress Notes (Signed)
Hypoglycemic Event  CBG: 51 at 0730  Treatment: 15 GM carbohydrate snack  Symptoms: None  Follow-up CBG: Time:0750 CBG Result:66  Then CBG 112 after breakfast  Possible Reasons for Event: Inadequate meal intake  Comments/MD notified:Dr. Hilty notified of blood sugar; orders received.  Will continue to monitor    Thomas [name]  Remember to initiate Hypoglycemia Order Set & complete

## 2015-06-21 LAB — RENAL FUNCTION PANEL
Albumin: 3.7 g/dL (ref 3.5–5.0)
Anion gap: 9 (ref 5–15)
BUN: 47 mg/dL — ABNORMAL HIGH (ref 6–20)
CHLORIDE: 101 mmol/L (ref 101–111)
CO2: 26 mmol/L (ref 22–32)
Calcium: 8.7 mg/dL — ABNORMAL LOW (ref 8.9–10.3)
Creatinine, Ser: 1.68 mg/dL — ABNORMAL HIGH (ref 0.61–1.24)
GFR, EST AFRICAN AMERICAN: 46 mL/min — AB (ref >60)
GFR, EST NON AFRICAN AMERICAN: 39 mL/min — AB (ref >60)
Glucose, Bld: 104 mg/dL — ABNORMAL HIGH (ref 65–99)
POTASSIUM: 3.7 mmol/L (ref 3.5–5.1)
Phosphorus: 4 mg/dL (ref 2.5–4.6)
Sodium: 136 mmol/L (ref 135–145)

## 2015-06-21 LAB — GLUCOSE, CAPILLARY
Glucose-Capillary: 97 mg/dL (ref 65–99)
Glucose-Capillary: 124 mg/dL — ABNORMAL HIGH (ref 65–99)
Glucose-Capillary: 81 mg/dL (ref 65–99)
Glucose-Capillary: 80 mg/dL (ref 65–99)

## 2015-06-21 MED ORDER — LEVALBUTEROL HCL 0.63 MG/3ML IN NEBU
0.6300 mg | INHALATION_SOLUTION | Freq: Four times a day (QID) | RESPIRATORY_TRACT | Status: DC
  Administered 2015-06-21 – 2015-06-24 (×11): 0.63 mg via RESPIRATORY_TRACT
  Filled 2015-06-21 (×23): qty 3

## 2015-06-21 MED ORDER — OXYBUTYNIN CHLORIDE 5 MG PO TABS
5.0000 mg | ORAL_TABLET | Freq: Three times a day (TID) | ORAL | Status: DC | PRN
  Administered 2015-06-21 – 2015-06-24 (×7): 5 mg via ORAL
  Filled 2015-06-21 (×7): qty 1

## 2015-06-21 NOTE — Progress Notes (Signed)
Pt states wears CPAP at home but refuses it at this time. Pt is stable at this time no complications noted.

## 2015-06-21 NOTE — Progress Notes (Signed)
SUBJECTIVE: The patient is doing well today.  He pulled his foley and has been well managed by urology.  He continues to have abdominal distension but had a BM this AM and feels "better".  SOB is improved.   At this time, he denies chest pain or any new concerns.  Marland Kitchen amiodarone  400 mg Oral Daily  . antiseptic oral rinse  7 mL Mouth Rinse BID  . atorvastatin  20 mg Oral Daily  . diltiazem  60 mg Oral QID  . gabapentin  300 mg Oral TID  . Influenza vac split quadrivalent PF  0.5 mL Intramuscular Tomorrow-1000  . insulin aspart  0-15 Units Subcutaneous TID WC  . insulin regular human CONCENTRATED  100 Units Subcutaneous Q breakfast  . insulin regular human CONCENTRATED  50 Units Subcutaneous QHS  . levalbuterol  0.63 mg Nebulization Q6H  . metoprolol succinate  75 mg Oral Daily  . rivaroxaban  15 mg Oral Q supper  . sodium chloride  3 mL Intravenous Q12H  . temazepam  15 mg Oral QHS   . nitroGLYCERIN Stopped (06/18/15 1200)    OBJECTIVE: Physical Exam: Filed Vitals:   06/21/15 0700 06/21/15 0800 06/21/15 0814 06/21/15 0900  BP: 131/83 148/104    Pulse: 81 64    Temp:   98 F (36.7 C) 98 F (36.7 C)  TempSrc:    Oral  Resp: 17 18    Height:      Weight:      SpO2: 94% 95%      Intake/Output Summary (Last 24 hours) at 06/21/15 1133 Last data filed at 06/21/15 0700  Gross per 24 hour  Intake    960 ml  Output    715 ml  Net    245 ml    Telemetry reveals afibb, rate controlled  GEN- The patient is well appearing, alert and oriented x 3 today.   Head- normocephalic, atraumatic Eyes-  Sclera clear, conjunctiva pink Ears- hearing intact Oropharynx- clear Neck- supple, no JVP Lungs- bibasilar rales, normal work of breathing Heart- irregular rate and rhythm, no murmurs, rubs or gallops, PMI not laterally displaced GI- + distended, mildly ttp, no rebound/ guarding, hypoactive BS Extremities- no clubbing, cyanosis, + dependant edema Skin- no rash or lesion Psych-  euthymic mood, full affect Neuro- strength and sensation are intact  LABS: Basic Metabolic Panel:  Recent Labs  40/98/11 0404 06/21/15 0247  NA 139 136  K 3.7 3.7  CL 101 101  CO2 27 26  GLUCOSE 65 104*  BUN 48* 47*  CREATININE 2.68* 1.68*  CALCIUM 8.7* 8.7*  PHOS 4.7* 4.0   Liver Function Tests:  Recent Labs  06/20/15 0404 06/21/15 0247  ALBUMIN 3.7 3.7   No results for input(s): LIPASE, AMYLASE in the last 72 hours. CBC: No results for input(s): WBC, NEUTROABS, HGB, HCT, MCV, PLT in the last 72 hours. Cardiac Enzymes:  Recent Labs  06/19/15 1440  CKTOTAL 286   ASSESSMENT AND PLAN:  Principal Problem:   Atrial flutter with rapid ventricular response Active Problems:   Diabetes mellitus   Essential hypertension   Mixed hyperlipidemia   Acute on chronic diastolic CHF (congestive heart failure), NYHA class 1   Acute renal failure   OSA on CPAP   Constipation  1. Persistent afib/ atypical atrial flutter Unable to pass TEE probe last week.  Barium swallow performed and plans for repeat TEE Monday were made Continue xarelto 15mg  daily (CrCl 48.7).  If creatinine improves  any further, would increase dose to  daily. On amiodarone for rate control  Dr Bruna Potter long term plan was TEE guided cardioversion with long term amiodarone Will need to follow closely with Dr Ladona Ridgel and afib clinic upon discharge  2. Foley trauma Appreciate urology assistance  3. Abdominal distension Improved with BM Will follow with exam  4. HTN Stable No change required today  5. DM Stable No change required today  6. Acute on chronic diastolic CHF (congestive heart failure)  Keep Is and Os about even  7. Acute renal failure Likely due to overdiuersis Keep Is and Os about even Increase xarelto if creatinine improves further  8. Constipation Improved  Transfer to telemetry  Hillis Range, MD 06/21/2015 11:33 AM

## 2015-06-21 NOTE — Progress Notes (Signed)
Around 0018 patient was noted to be off telemetry . Went to check on patient and found  him standing in front of the bedside commode trying to void. Foley catheter was pulled out with the balloon intact and patient was bleeding profusely from his penis. Patient is on Xarelto for his afib. Patient said that he woke up wondering where he is. Pt reoriented.  This is the first time that patient was confused. Cardiology fellow notified and urology consulted.

## 2015-06-21 NOTE — Progress Notes (Signed)
Subjective:  Events noted- pulled foley out with balloon intact/gross hematuria-   urology saw- now with larger catheter and s/p clot irrigation.  Creatinine down - good UOP  Objective Vital signs in last 24 hours: Filed Vitals:   06/21/15 0500 06/21/15 0600 06/21/15 0700 06/21/15 0814  BP: 131/69 151/58 131/83   Pulse: 86 144 81   Temp:    98 F (36.7 C)  TempSrc:      Resp: 13 13 17    Height:      Weight:      SpO2: 91% 100% 94%    Weight change: -0.4 kg (-14.1 oz)  Intake/Output Summary (Last 24 hours) at 06/21/15 5284 Last data filed at 06/21/15 0700  Gross per 24 hour  Intake    960 ml  Output    865 ml  Net     95 ml    Assessment/ Plan: Pt is Thomas [name] 74 y.o. yo male with multiple medical problems who was admitted on 06/17/2015 with Afib and CHF- developed AKI in house in the setting of diuresis and hypotension on an ACE-I  Assessment/Plan: 1. Renal- AKI in the setting of above I think mostly ATN due to hypotension on lisinopril and possibly some BOO.  Renal ultrasound does not indicate outright obstruction. CK does not appear to be an issue- SPEP/UPEP pending.  BP is better and creatinine almost down to baseline- lisinopril on hold which I would continue to do until renal function back to under 1.3 for at least 48 hours 2. CHF-  Still Thomas [name] little overloaded - will leave to cards if would like to use lasix 3. Afib- for TEE/cardioversion eventually - on amiodarone/cardizem/toprol but BP is OK 4. Anemia- not Thomas [name] significant issue 5. Gross hematuria-had yest but now s/p urethral trauma- per urology - foley in place which needs to stay for 3 days   Since renal function is trending better- renal will sign off- call if we can be of further assist    Thomas Thomas [name]    Labs: Basic Metabolic Panel:  Recent Labs Lab 06/19/15 1440 06/20/15 0404 06/21/15 0247  NA 138 139 136  K 4.2 3.7 3.7  CL 100* 101 101  CO2 27 27 26   GLUCOSE 142* 65 104*  BUN 40* 48* 47*  CREATININE  3.78* 2.68* 1.68*  CALCIUM 8.6* 8.7* 8.7*  PHOS 5.9* 4.7* 4.0   Liver Function Tests:  Recent Labs Lab 06/17/15 1325 06/19/15 1440 06/20/15 0404 06/21/15 0247  AST 44*  --   --   --   ALT 31  --   --   --   ALKPHOS 54  --   --   --   BILITOT 1.1  --   --   --   PROT 7.1  --   --   --   ALBUMIN 3.8 3.8 3.7 3.7   No results for input(s): LIPASE, AMYLASE in the last 168 hours. No results for input(s): AMMONIA in the last 168 hours. CBC:  Recent Labs Lab 06/17/15 1325  WBC 7.1  NEUTROABS 5.6  HGB 12.5*  HCT 38.7*  MCV 97.0  PLT 145*   Cardiac Enzymes:  Recent Labs Lab 06/17/15 1325 06/17/15 1835 06/18/15 0040 06/19/15 1440  CKTOTAL  --   --   --  286  TROPONINI 0.04* 0.04* 0.04*  --    CBG:  Recent Labs Lab 06/20/15 0752 06/20/15 0838 06/20/15 1138 06/20/15 1708 06/21/15 0812  GLUCAP 66 112* 113* 128* 97  Iron Studies: No results for input(s): IRON, TIBC, TRANSFERRIN, FERRITIN in the last 72 hours. Studies/Results: Dg Esophagus  06/19/2015   CLINICAL DATA:  74 year old inpatient male, unable to pass transesophageal echocardiogram probe earlier today. Query diverticulum. Initial encounter.  EXAM: ESOPHOGRAM/BARIUM SWALLOW  TECHNIQUE: Single contrast examination was performed using thin barium or water soluble.  FLUOROSCOPY TIME:  Radiation Exposure Index (as provided by the fluoroscopic device):  If the device does not provide the exposure index:  Fluoroscopy Time:  1 minutes 24 seconds  Number of Acquired Images:  0  COMPARISON:  Chest CT 11/24/2006.  FINDINGS: Limited patient mobility. Study performed with the patient supine on the plus fluoroscopy table inclined at 25 degrees.  He tolerated the study without difficulty.  No obstruction to the forward flow of contrast throughout the esophagus and into the stomach.  There is Thomas [name] somewhat tortuous appearance of the upper cervical esophagus best seen on series 1, image 55. Beyond that, frequent tertiary  contractions and mildly tortuous appearance of the mid thoracic esophagus is noted. No evidence of esophageal diverticulum. Gastroesophageal junction within normal limits.  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   Electronically Signed   By: Odessa Fleming M.D.   On: 06/19/2015 13:12   US Renal  06/19/2015   CLINICAL DATA:  Acute renal failure  EXAM: RENAL ULTRASOUND  COMPARISON:  December 08, 2010  FINDINGS: Right Kidney:  Length: 14.3 cm. Echogenicity and renal echogenicity are within normal limits. No mass, perinephric fluid, or hydronephrosis visualized. No sonographically demonstrable calculus or ureterectasis.  Left Kidney:  Length: 13.6 cm. Echogenicity and renal cortical thickness are within normal limits. No perinephric fluid or hydronephrosis visualized. There is Thomas [name] stable 1.7 x 1.5 cm lower pole renal cyst. No other mass seen. No sonographically demonstrable calculus or ureterectasis.  Bladder:  Decompressed with Foley catheter and cannot be assessed.  IMPRESSION: Stable small cyst lower pole left kidney. Kidney is otherwise appear unremarkable and stable.   Electronically Signed   By: Bretta Bang III M.D.   On: 06/19/2015 15:37   Dg Abd Portable 1v  06/20/2015   CLINICAL DATA:  Abdominal pain.  Obstipation.  EXAM: PORTABLE ABDOMEN - 1 VIEW  COMPARISON:  09/16/2013  FINDINGS: Barium in the right colon. Retained stool in the right colon. The right and transverse colon and proximal left colon are dilated. The lower left colon and rectum are nondilated. No dilatation of the small bowel.  Right hip replacement.  No acute bony abnormality.  IMPRESSION: Barium and stool in the right colon. The colon is diffusely dilated most consistent with colonic ileus. Left colonic obstruction not excluded.   Electronically Signed   By: Marlan Palau M.D.   On: 06/20/2015 12:04   Medications: Infusions: .  nitroGLYCERIN Stopped (06/18/15 1200)    Scheduled Medications: . amiodarone  400 mg Oral Daily  . antiseptic oral rinse  7 mL Mouth Rinse BID  . atorvastatin  20 mg Oral Daily  . diltiazem  60 mg Oral QID  . gabapentin  300 mg Oral TID  . Influenza vac split quadrivalent PF  0.5 mL Intramuscular Tomorrow-1000  . insulin aspart  0-15 Units Subcutaneous TID WC  . insulin regular human CONCENTRATED  100 Units Subcutaneous Q breakfast  . insulin regular human CONCENTRATED  50 Units Subcutaneous QHS  . levalbuterol  0.63 mg Nebulization Q6H  . metoprolol succinate  75 mg Oral  Daily  . rivaroxaban  15 mg Oral Q supper  . sodium chloride  3 mL Intravenous Q12H  . temazepam  15 mg Oral QHS    have reviewed scheduled and prn medications.  Physical Exam: General: alert, talkative Heart: irreg Lungs: mostly clear Abdomen: obese, non tender Extremities: mild pitting edema    06/21/2015,8:29 AM  LOS: 4 days

## 2015-06-21 NOTE — Progress Notes (Signed)
2 Days Post-Op    Subjective:  1 - Gross Hematuria / Urethral Trauma - new gross hematuria after catheter pulled out with inflated balloon. He is anticoagulated after recent cardioversion with non-reversible agent. 74F foley placed early AM 9/10 and irrigated modest amt clot.  2 - Acute Renal Failure - Cr 3.8 06/19/15 up from baseline >1.5. Renal US w/o hydro. He has had RVR this admission with hypotension. No baseline voiding complaints / GU meds or h/o urinary retention. Now improving with hydration / conservative measures, now 1.6's.   3 - Left Peripelvic Renal Cyst - Left 1.7cm lower pole peri-pelvic cyst noted by [place] 06/2015, also retrospectivley noted by CT 2011.  Today "Arlester" is seen in f/u above. Had some bladder spasms yesterday and some mild hematuria w/o clots per catheter as expected. Cr improving. No specific complaints this AM.  Objective: Vital signs in last 24 hours: Temp:  [97.4 F (36.3 C)-99 F (37.2 C)] 98 F (36.7 C) (09/10 0900) Pulse Rate:  [55-144] 64 (09/10 0800) Resp:  [13-23] 18 (09/10 0800) BP: (99-151)/(50-107) 148/104 mmHg (09/10 0800) SpO2:  [91 %-100 %] 95 % (09/10 0800) Weight:  [100.2 kg (220 lb 14.4 oz)] 100.2 kg (220 lb 14.4 oz) (09/10 0401) Last BM Date: 06/19/15  Intake/Output from previous day: 09/09 0701 - 09/10 0700 In: 1160 [P.O.:1160] Out: 1065 [Urine:1065] Intake/Output this shift:    General appearance: alert, cooperative, appears stated age and wife at bedside Head: Normocephalic, without obvious abnormality, atraumatic Nose: Nares normal. Septum midline. Mucosa normal. No drainage or sinus tenderness. Throat: lips, mucosa, and tongue normal; teeth and gums normal Back: symmetric, no curvature. ROM normal. No CVA tenderness. Resp: non-labored on [place] O2 Male genitalia: normal, large bore 3 way catheter with irrigation port plugged. No bleeding per urethra. Some blood-tinged thin urien in foley bag w/o clots.  Skin: Skin color, texture,  turgor normal. No rashes or lesions Lymph nodes: Cervical, supraclavicular, and axillary nodes normal. Neurologic: Grossly normal  Lab Results:  No results for input(s): WBC, HGB, HCT, PLT in the last 72 hours. BMET  Recent Labs  06/20/15 0404 06/21/15 0247  NA 139 136  K 3.7 3.7  CL 101 101  CO2 27 26  GLUCOSE 65 104*  BUN 48* 47*  CREATININE 2.68* 1.68*  CALCIUM 8.7* 8.7*   PT/INR No results for input(s): LABPROT, INR in the last 72 hours. ABG No results for input(s): PHART, HCO3 in the last 72 hours.  Invalid input(s): PCO2, PO2  Studies/Results: Dg Esophagus  06/19/2015   CLINICAL DATA:  74 year old inpatient male, unable to pass transesophageal echocardiogram probe earlier today. Query diverticulum. Initial encounter.  EXAM: ESOPHOGRAM/BARIUM SWALLOW  TECHNIQUE: Single contrast examination was performed using thin barium or water soluble.  FLUOROSCOPY TIME:  Radiation Exposure Index (as provided by the fluoroscopic device):  If the device does not provide the exposure index:  Fluoroscopy Time:  1 minutes 24 seconds  Number of Acquired Images:  0  COMPARISON:  Chest CT 11/24/2006.  FINDINGS: Limited patient mobility. Study performed with the patient supine on the plus fluoroscopy table inclined at 25 degrees.  He tolerated the study without difficulty.  No obstruction to the forward flow of contrast throughout the esophagus and into the stomach.  There is a somewhat tortuous appearance of the upper cervical esophagus best seen on series 1, image 55. Beyond that, frequent tertiary contractions and mildly tortuous appearance of the mid thoracic esophagus is noted. No evidence of esophageal diverticulum.  Gastroesophageal junction within normal limits.  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   Electronically Signed   By: Odessa Fleming M.D.   On: 06/19/2015 13:12   US  Renal  06/19/2015   CLINICAL DATA:  Acute renal failure  EXAM: RENAL ULTRASOUND  COMPARISON:  December 08, 2010  FINDINGS: Right Kidney:  Length: 14.3 cm. Echogenicity and renal echogenicity are within normal limits. No mass, perinephric fluid, or hydronephrosis visualized. No sonographically demonstrable calculus or ureterectasis.  Left Kidney:  Length: 13.6 cm. Echogenicity and renal cortical thickness are within normal limits. No perinephric fluid or hydronephrosis visualized. There is a stable 1.7 x 1.5 cm lower pole renal cyst. No other mass seen. No sonographically demonstrable calculus or ureterectasis.  Bladder:  Decompressed with Foley catheter and cannot be assessed.  IMPRESSION: Stable small cyst lower pole left kidney. Kidney is otherwise appear unremarkable and stable.   Electronically Signed   By: Bretta Bang III M.D.   On: 06/19/2015 15:37   Dg Abd Portable 1v  06/20/2015   CLINICAL DATA:  Abdominal pain.  Obstipation.  EXAM: PORTABLE ABDOMEN - 1 VIEW  COMPARISON:  09/16/2013  FINDINGS: Barium in the right colon. Retained stool in the right colon. The right and transverse colon and proximal left colon are dilated. The lower left colon and rectum are nondilated. No dilatation of the small bowel.  Right hip replacement.  No acute bony abnormality.  IMPRESSION: Barium and stool in the right colon. The colon is diffusely dilated most consistent with colonic ileus. Left colonic obstruction not excluded.   Electronically Signed   By: Marlan Palau M.D.   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 This will need to stay in place at least 3 days. He will likely have some scant bleeding around it and some pink-tinged urine but as long as urine flowing this is not concerning. I feel it is reasonable to continue  anticoagulation for cardiac indication.   RX'd oxybutynin prn for bladder spasms. I told pt this will likley improve but not completely resolve occasional spasms with large catheter in place but that antispamodics will likely be more effective than than narcotics.   2 - Acute Renal Failure - likely pre-renal insult from recent cardiogenic hypotension. No hydro on [place]. Improving with conservative measures.  3 - Left Peripelvic Renal Cyst - stable and relatively non-complex on serial imaging, no mass effect, no further efaluation warranted.    [facility], Lisaanne Lawrie 06/21/2015

## 2015-06-21 NOTE — Progress Notes (Signed)
Spoke with wife regarding events overnight. Spoke with wife about her concerns and gave her permission to stay overnight ,after she repeatedly told me "we kicked her out " overnight. Pt. 's wife stated "oh I cant stay at night,I have animals to take care of." Reviewed new plan for safety with pts wife and pt's wife agreeable.   Thomas [name]

## 2015-06-21 NOTE — Progress Notes (Signed)
Received in room 2W14 @ 1810pm  From Unit 2 Heart, alert & oriented, vss, pain free, wife at bedside with patient, IV site with NSL,  Foley cath in place with rose colored urine,   PRN med given for bladder spasms.  Governor Specking, RN

## 2015-06-21 NOTE — Consult Note (Signed)
Reason for Consult: Gross Hematuria / Urethral Trauma, Acute Renal Failure, Left Peripelvic Renal Cyst  Referring Physician: Crissie Sickles MD  Thomas [name] is an 74 y.o. male.   HPI:   1 - Gross Hematuria / Urethral Trauma - new gross hematuria after catheter pulled out with inflated balloon. He is anticoagulated after recent cardioversion with non-reversible agent  2 -  Acute Renal Failure - Cr 3.8 06/19/15 up from baseline >1.5. Renal US w/o hydro. He has had RVR this admission with hypotension. No baseline voiding complaints / GU meds or h/o urinary retention.   3 - Left Peripelvic Renal Cyst - Left 1.7cm lower pole peri-pelvic cyst noted by [place] 06/2015, also retrospectivley noted by CT 2011.  Today "Thomas [name]" is seen as emergent consult for above, specifically for evaluation and management of active bleeding per Thomas [name] after his catheter was inadvertatnly removed by pt with balloon up. 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   Past Medical History  Diagnosis Date  . Diabetes mellitus   . CAD (coronary artery disease)   . Hypertension   . Claudication in peripheral vascular disease 07/08/2011    Left external iliac occlusion with collaterals; mild-moderate right external iliac disease; moderate calcified left popliteal disease  . Carotid artery disease 2012    < 50% stenosis bilaterally  . Hyperlipidemia   . AAA (abdominal aortic aneurysm)   . Chronic kidney disease     hx kidney stones  . Arthritis   . 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 Acute diastolic heart failure   . Shortness of breath     Past Surgical History  Procedure Laterality Date  . Shoulder surgery      right  . Carpal tunnel release  2006    right   . Hip fracture surgery  11/2007    right  . Joint replacement  2009    right total hip replacement  . Coronary angioplasty with stent placement  05/30/2007    Mid RCA 3.0 x 15 mm vision BMS, distal  RCA 2.5 x 12 mm vision BMS, PDA 2.0 x 12 mm vision BMS  . Tonsillectomy      as child  . Total hip revision Right 09/03/2013    Procedure: RIGHT TOTAL HIP REVISION;  Surgeon: Mauri Pole, MD;  Location: [facility];  Service: Orthopedics;  Laterality: Right;  . Atrial flutter ablation  11/03/2006  . Coronary angioplasty with stent placement  10/06/2007    LM 25, LAD 25, D1 50, D2 50, CFX mild dz, OM1 diffuse dz, OM 2 100 w/ L-L collaterals, mid-RCA stent with mild ISR, distal RCA stent with > 90 ISR, subtotal PL, Rx w/ 2.5 x 15-mm Promus DES. EF 55%,     Family History  Problem Relation Age of Onset  . Emphysema Mother   . Cancer Father     Brain  . Diabetes Son     Social History:  reports that he quit smoking about 9 years ago. His smoking use included Cigarettes. He has never used smokeless tobacco. He reports that he drinks alcohol. He reports that he does not use illicit drugs.  Allergies:  Allergies  Allergen Reactions  . Ambien [Zolpidem] Other (See Comments)    Sleep walking  . Nsaids Diarrhea    Medications: I have reviewed the patient's current medications.  Results for orders placed or performed during the hospital encounter of 06/17/15 (from the  past 48 hour(s))  Basic metabolic panel     Status: Abnormal   Collection Time: 06/19/15  3:10 AM  Result Value Ref Range   Sodium 135 135 - 145 mmol/L   Potassium 4.3 3.5 - 5.1 mmol/L   Chloride 99 (L) 101 - 111 mmol/L   CO2 26 22 - 32 mmol/L   Glucose, Bld 232 (H) 65 - 99 mg/dL   BUN 34 (H) 6 - 20 mg/dL   Creatinine, Ser 3.05 (H) 0.61 - 1.24 mg/dL    Comment: DELTA CHECK NOTED   Calcium 8.5 (L) 8.9 - 10.3 mg/dL   GFR calc non Af Amer 19 (L) >60 mL/min   GFR calc Af Amer 22 (L) >60 mL/min    Comment: (NOTE) The eGFR has been calculated using the CKD EPI equation. This calculation has not been validated in all clinical situations. eGFR's persistently <60 mL/min signify possible Chronic Kidney Disease.    Anion gap  10 5 - 15  Glucose, capillary     Status: Abnormal   Collection Time: 06/19/15  8:02 AM  Result Value Ref Range   Glucose-Capillary 208 (H) 65 - 99 mg/dL   Comment 1 Capillary Specimen   Glucose, capillary     Status: Abnormal   Collection Time: 06/19/15 11:16 AM  Result Value Ref Range   Glucose-Capillary 160 (H) 65 - 99 mg/dL   Comment 1 Capillary Specimen   Sodium, urine, random     Status: None   Collection Time: 06/19/15  1:48 PM  Result Value Ref Range   Sodium, Ur 54 mmol/L  Creatinine, urine, random     Status: None   Collection Time: 06/19/15  1:48 PM  Result Value Ref Range   Creatinine, Urine 82.73 mg/dL  Urinalysis, Routine w reflex microscopic (not at [facility])     Status: Abnormal   Collection Time: 06/19/15  1:48 PM  Result Value Ref Range   Color, Urine YELLOW YELLOW   APPearance HAZY (A) CLEAR   Specific Gravity, Urine 1.015 1.005 - 1.030   pH 5.0 5.0 - 8.0   Glucose, UA 500 (A) NEGATIVE mg/dL   Hgb urine dipstick SMALL (A) NEGATIVE   Bilirubin Urine NEGATIVE NEGATIVE   Ketones, ur NEGATIVE NEGATIVE mg/dL   Protein, ur >300 (A) NEGATIVE mg/dL   Urobilinogen, UA 0.2 0.0 - 1.0 mg/dL   Nitrite NEGATIVE NEGATIVE   Leukocytes, UA NEGATIVE NEGATIVE  Urine microscopic-add on     Status: Abnormal   Collection Time: 06/19/15  1:48 PM  Result Value Ref Range   Squamous Epithelial / LPF FEW (A) RARE   WBC, UA 0-2 <3 WBC/hpf   RBC / HPF 3-6 <3 RBC/hpf   Bacteria, UA FEW (A) RARE   Casts GRANULAR CAST (A) NEGATIVE   Urine-Other AMORPHOUS URATES/PHOSPHATES   Renal function panel     Status: Abnormal   Collection Time: 06/19/15  2:40 PM  Result Value Ref Range   Sodium 138 135 - 145 mmol/L   Potassium 4.2 3.5 - 5.1 mmol/L   Chloride 100 (L) 101 - 111 mmol/L   CO2 27 22 - 32 mmol/L   Glucose, Bld 142 (H) 65 - 99 mg/dL   BUN 40 (H) 6 - 20 mg/dL   Creatinine, Ser 3.78 (H) 0.61 - 1.24 mg/dL   Calcium 8.6 (L) 8.9 - 10.3 mg/dL   Phosphorus 5.9 (H) 2.5 - 4.6 mg/dL    Albumin 3.8 3.5 - 5.0 g/dL   GFR calc non Af  Amer 15 (L) >60 mL/min   GFR calc Af Amer 17 (L) >60 mL/min    Comment: (NOTE) The eGFR has been calculated using the CKD EPI equation. This calculation has not been validated in all clinical situations. eGFR's persistently <60 mL/min signify possible Chronic Kidney Disease.    Anion gap 11 5 - 15  CK     Status: None   Collection Time: 06/19/15  2:40 PM  Result Value Ref Range   Total CK 286 49 - 397 U/L  Protein electrophoresis, serum     Status: None   Collection Time: 06/19/15  2:40 PM  Result Value Ref Range   Total Protein ELP 6.6 6.0 - 8.5 g/dL   Albumin ELP 3.3 2.9 - 4.4 g/dL   Alpha-1-Globulin 0.3 0.0 - 0.4 g/dL   Alpha-2-Globulin 0.9 0.4 - 1.0 g/dL   Beta Globulin 1.2 0.7 - 1.3 g/dL   Gamma Globulin 0.9 0.4 - 1.8 g/dL   M-Spike, % Not Observed Not Observed g/dL   SPE Interp. Comment     Comment: (NOTE) The SPE pattern appears essentially unremarkable. Evidence of monoclonal protein is not apparent. Performed At: [facility] [place], [place] [id number] Lindon Romp MD ER:[id number]    Comment Comment     Comment: (NOTE) Protein electrophoresis scan will follow via computer, mail, or courier delivery.    GLOBULIN, TOTAL 3.3 2.2 - 3.9 g/dL   A/G Ratio 1.0 0.7 - 1.7  Glucose, capillary     Status: Abnormal   Collection Time: 06/19/15  5:30 PM  Result Value Ref Range   Glucose-Capillary 112 (H) 65 - 99 mg/dL   Comment 1 Capillary Specimen   Glucose, capillary     Status: Abnormal   Collection Time: 06/19/15  9:21 PM  Result Value Ref Range   Glucose-Capillary 117 (H) 65 - 99 mg/dL   Comment 1 Capillary Specimen   Renal function panel     Status: Abnormal   Collection Time: 06/20/15  4:04 AM  Result Value Ref Range   Sodium 139 135 - 145 mmol/L   Potassium 3.7 3.5 - 5.1 mmol/L   Chloride 101 101 - 111 mmol/L   CO2 27 22 - 32 mmol/L   Glucose, Bld 65 65 - 99 mg/dL   BUN 48 (H) 6 - 20  mg/dL   Creatinine, Ser 2.68 (H) 0.61 - 1.24 mg/dL    Comment: DELTA CHECK NOTED   Calcium 8.7 (L) 8.9 - 10.3 mg/dL   Phosphorus 4.7 (H) 2.5 - 4.6 mg/dL   Albumin 3.7 3.5 - 5.0 g/dL   GFR calc non Af Amer 22 (L) >60 mL/min   GFR calc Af Amer 26 (L) >60 mL/min    Comment: (NOTE) The eGFR has been calculated using the CKD EPI equation. This calculation has not been validated in all clinical situations. eGFR's persistently <60 mL/min signify possible Chronic Kidney Disease.    Anion gap 11 5 - 15  Glucose, capillary     Status: Abnormal   Collection Time: 06/20/15  7:29 AM  Result Value Ref Range   Glucose-Capillary 51 (L) 65 - 99 mg/dL  Glucose, capillary     Status: None   Collection Time: 06/20/15  7:52 AM  Result Value Ref Range   Glucose-Capillary 66 65 - 99 mg/dL  Glucose, capillary     Status: Abnormal   Collection Time: 06/20/15  8:38 AM  Result Value Ref Range   Glucose-Capillary 112 (H) 65 -  99 mg/dL   Comment 1 Capillary Specimen   Glucose, capillary     Status: Abnormal   Collection Time: 06/20/15 11:38 AM  Result Value Ref Range   Glucose-Capillary 113 (H) 65 - 99 mg/dL   Comment 1 Capillary Specimen   Glucose, capillary     Status: Abnormal   Collection Time: 06/20/15  5:08 PM  Result Value Ref Range   Glucose-Capillary 128 (H) 65 - 99 mg/dL    Dg Esophagus  06/19/2015   CLINICAL DATA:  74 year old inpatient male, unable to pass transesophageal echocardiogram probe earlier today. Query diverticulum. Initial encounter.  EXAM: ESOPHOGRAM/BARIUM SWALLOW  TECHNIQUE: Single contrast examination was performed using thin barium or water soluble.  FLUOROSCOPY TIME:  Radiation Exposure Index (as provided by the fluoroscopic device):  If the device does not provide the exposure index:  Fluoroscopy Time:  1 minutes 24 seconds  Number of Acquired Images:  0  COMPARISON:  Chest CT 11/24/2006.  FINDINGS: Limited patient mobility. Study performed with the patient supine on the plus  fluoroscopy table inclined at 25 degrees.  He tolerated the study without difficulty.  No obstruction to the forward flow of contrast throughout the esophagus and into the stomach.  There is a somewhat tortuous appearance of the upper cervical esophagus best seen on series 1, image 55. Beyond that, frequent tertiary contractions and mildly tortuous appearance of the mid thoracic esophagus is noted. No evidence of esophageal diverticulum. Gastroesophageal junction within normal limits.  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   Electronically Signed   By: Genevie Ann M.D.   On: 06/19/2015 13:12   US Renal  06/19/2015   CLINICAL DATA:  Acute renal failure  EXAM: RENAL ULTRASOUND  COMPARISON:  December 08, 2010  FINDINGS: Right Kidney:  Length: 14.3 cm. Echogenicity and renal echogenicity are within normal limits. No mass, perinephric fluid, or hydronephrosis visualized. No sonographically demonstrable calculus or ureterectasis.  Left Kidney:  Length: 13.6 cm. Echogenicity and renal cortical thickness are within normal limits. No perinephric fluid or hydronephrosis visualized. There is a stable 1.7 x 1.5 cm lower pole renal cyst. No other mass seen. No sonographically demonstrable calculus or ureterectasis.  Bladder:  Decompressed with Foley catheter and cannot be assessed.  IMPRESSION: Stable small cyst lower pole left kidney. Kidney is otherwise appear unremarkable and stable.   Electronically Signed   By: Lowella Grip III M.D.   On: 06/19/2015 15:37   Dg Abd Portable 1v  06/20/2015   CLINICAL DATA:  Abdominal pain.  Obstipation.  EXAM: PORTABLE ABDOMEN - 1 VIEW  COMPARISON:  09/16/2013  FINDINGS: Barium in the right colon. Retained stool in the right colon. The right and transverse colon and proximal left colon are dilated. The lower left colon and rectum are nondilated. No dilatation of the small  bowel.  Right hip replacement.  No acute bony abnormality.  IMPRESSION: Barium and stool in the right colon. The colon is diffusely dilated most consistent with colonic ileus. Left colonic obstruction not excluded.   Electronically Signed   By: Franchot Gallo M.D.   On: 06/20/2015 12:04    Review of Systems  Constitutional: Negative.  Negative for fever and chills.  HENT: Negative.   Eyes: Negative.   Respiratory: Negative.   Cardiovascular: Negative.   Gastrointestinal: Negative.  Negative for nausea and vomiting.  Genitourinary:       Constant penile bleeding  Musculoskeletal:  Negative.   Skin: Negative.   Neurological: Negative.   Endo/Heme/Allergies: Bruises/bleeds easily.  Psychiatric/Behavioral: Negative.    Blood pressure 118/53, pulse 106, temperature 98.4 F (36.9 C), temperature source Oral, resp. rate 19, height $RemoveBe'5\' 11"'CcmvflAWl$  (1.803 m), weight 100.6 kg (221 lb 12.5 oz), SpO2 99 %. Physical Exam  Constitutional: He appears well-developed.  HENT:  Head: Normocephalic.  Eyes: Pupils are equal, round, and reactive to light.  Neck: Normal range of motion.  Cardiovascular: Normal rate.   Respiratory: Effort normal.  GI: Soft.  Genitourinary:  Bright red blood trickling per penis  Musculoskeletal: Normal range of motion.  Neurological: He is alert.  Skin: Skin is warm.  Psychiatric: He has a normal mood and affect. His behavior is normal. Judgment and thought content normal.    Using aseptic technique 72F coude hematuria catheter placed with 10cc sterile water in balloon. No immediate efflux urine. Bladder irrigated by hand in 60cc aliquots with 500cc sterile water. Small clot removed, then completely clear with driange of mostly yellow urine. Connected to straight drain. Bleeding per urethra completely stopped.    Assessment/Plan:  1 - Gross Hematuria / Urethral Trauma -  Gross blood resolved with large catheter and clot irrigation. This will need to stay in place at least 3  days. He will likely have some scant bleeding around it and some pink-tinged urine but as long as urine flowing this is not concerning. I feel it is reasonable to continue anticoagulation for cardiac indication.   2 -  Acute Renal Failure - likely pre-renal insult from recent cardiogenic hypotension. No hydro on [place].   3 - Left Peripelvic Renal Cyst - stable and relatively non-complex on serial imaging, no mass effect, no further efaluation warranted.  I attempted to call the requesting MD x 2 to inform him of results of emergent consult but straight to "voicemail that has not been set up".   Will follow prn while in house, call with questions.    Dodi Leu 06/21/2015, 1:08 AM

## 2015-06-21 NOTE — Progress Notes (Signed)
Dr. Alphonsus Sias notified about pt's CBG of 80.  Ordered to give 30 units of Humulin rather than 50 units.  Lesly Dukes, RN

## 2015-06-22 LAB — GLUCOSE, CAPILLARY
Glucose-Capillary: 117 mg/dL — ABNORMAL HIGH (ref 65–99)
Glucose-Capillary: 69 mg/dL (ref 65–99)
Glucose-Capillary: 75 mg/dL (ref 65–99)
Glucose-Capillary: 100 mg/dL — ABNORMAL HIGH (ref 65–99)

## 2015-06-22 LAB — CBC
HEMATOCRIT: 36 % — AB (ref 39.0–52.0)
Hemoglobin: 12 g/dL — ABNORMAL LOW (ref 13.0–17.0)
MCH: 31.9 pg (ref 26.0–34.0)
MCHC: 33.3 g/dL (ref 30.0–36.0)
MCV: 95.7 fL (ref 78.0–100.0)
Platelets: 202 10*3/uL (ref 150–400)
RBC: 3.76 MIL/uL — ABNORMAL LOW (ref 4.22–5.81)
RDW: 13.5 % (ref 11.5–15.5)
WBC: 12.3 10*3/uL — AB (ref 4.0–10.5)

## 2015-06-22 LAB — RENAL FUNCTION PANEL
Albumin: 3.6 g/dL (ref 3.5–5.0)
Anion gap: 10 (ref 5–15)
BUN: 37 mg/dL — AB (ref 6–20)
CHLORIDE: 100 mmol/L — AB (ref 101–111)
CO2: 29 mmol/L (ref 22–32)
Calcium: 9.3 mg/dL (ref 8.9–10.3)
Creatinine, Ser: 1.16 mg/dL (ref 0.61–1.24)
Glucose, Bld: 62 mg/dL — ABNORMAL LOW (ref 65–99)
POTASSIUM: 3.5 mmol/L (ref 3.5–5.1)
Phosphorus: 2.8 mg/dL (ref 2.5–4.6)
Sodium: 139 mmol/L (ref 135–145)

## 2015-06-22 MED ORDER — SODIUM CHLORIDE 0.9 % IV SOLN
INTRAVENOUS | Status: DC
  Administered 2015-06-23: 13:00:00 via INTRAVENOUS
  Administered 2015-06-23: 500 mL via INTRAVENOUS

## 2015-06-22 MED ORDER — SENNA 8.6 MG PO TABS
1.0000 | ORAL_TABLET | Freq: Two times a day (BID) | ORAL | Status: DC | PRN
  Administered 2015-06-23: 8.6 mg via ORAL
  Filled 2015-06-22: qty 1

## 2015-06-22 MED ORDER — DOCUSATE SODIUM 100 MG PO CAPS
100.0000 mg | ORAL_CAPSULE | Freq: Two times a day (BID) | ORAL | Status: DC | PRN
  Administered 2015-06-23: 100 mg via ORAL
  Filled 2015-06-22: qty 1

## 2015-06-22 MED ORDER — RIVAROXABAN 20 MG PO TABS
20.0000 mg | ORAL_TABLET | Freq: Every day | ORAL | Status: DC
  Administered 2015-06-22 – 2015-06-23 (×2): 20 mg via ORAL
  Filled 2015-06-22 (×5): qty 1

## 2015-06-22 NOTE — Progress Notes (Signed)
SUBJECTIVE: The patient is doing well today.    He continues to have abdominal distension but abdmoen feels "better" and he wants to eat.  SOB is improved.   At this time, he denies chest pain or any new concerns.  Marland Kitchen amiodarone  400 mg Oral Daily  . antiseptic oral rinse  7 mL Mouth Rinse BID  . atorvastatin  20 mg Oral Daily  . diltiazem  60 mg Oral QID  . gabapentin  300 mg Oral TID  . Influenza vac split quadrivalent PF  0.5 mL Intramuscular Tomorrow-1000  . insulin aspart  0-15 Units Subcutaneous TID WC  . insulin regular human CONCENTRATED  100 Units Subcutaneous Q breakfast  . insulin regular human CONCENTRATED  50 Units Subcutaneous QHS  . levalbuterol  0.63 mg Nebulization Q6H  . metoprolol succinate  75 mg Oral Daily  . rivaroxaban  20 mg Oral Q supper  . sodium chloride  3 mL Intravenous Q12H  . temazepam  15 mg Oral QHS   . nitroGLYCERIN Stopped (06/18/15 1200)    OBJECTIVE: Physical Exam: Filed Vitals:   06/21/15 2102 06/21/15 2124 06/22/15 0439 06/22/15 0800  BP: 132/68  114/86   Pulse: 83  62   Temp: 98.7 F (37.1 C)  98.2 F (36.8 C)   TempSrc: Oral  Oral   Resp: 20  20   Height:      Weight:   101.1 kg (222 lb 14.2 oz)   SpO2: 92% 97% 95% 96%    Intake/Output Summary (Last 24 hours) at 06/22/15 1230 Last data filed at 06/22/15 0730  Gross per 24 hour  Intake    810 ml  Output    885 ml  Net    -75 ml    Telemetry reveals afibb, rate controlled  GEN- The patient is well appearing, alert and oriented x 3 today.   Head- normocephalic, atraumatic Eyes-  Sclera clear, conjunctiva pink Ears- hearing intact Oropharynx- clear Neck- supple, no JVP Lungs- bibasilar rales, normal work of breathing Heart- irregular rate and rhythm, no murmurs, rubs or gallops, PMI not laterally displaced GI- + distended, mildly ttp, no rebound/ guarding, hypoactive BS Extremities- no clubbing, cyanosis, + dependant edema Skin- no rash or lesion Psych- euthymic mood,  full affect Neuro- strength and sensation are intact  LABS: Basic Metabolic Panel:  Recent Labs  57/84/69 0247 06/22/15 0436  NA 136 139  K 3.7 3.5  CL 101 100*  CO2 26 29  GLUCOSE 104* 62*  BUN 47* 37*  CREATININE 1.68* 1.16  CALCIUM 8.7* 9.3  PHOS 4.0 2.8   Liver Function Tests:  Recent Labs  06/21/15 0247 06/22/15 0436  ALBUMIN 3.7 3.6   No results for input(s): LIPASE, AMYLASE in the last 72 hours. CBC:  Recent Labs  06/22/15 0436  WBC 12.3*  HGB 12.0*  HCT 36.0*  MCV 95.7  PLT 202   Cardiac Enzymes:  Recent Labs  06/19/15 1440  CKTOTAL 286   ASSESSMENT AND PLAN:  Principal Problem:   Atrial flutter with rapid ventricular response Active Problems:   Diabetes mellitus   Essential hypertension   Mixed hyperlipidemia   Acute on chronic diastolic CHF (congestive heart failure), NYHA class 1   Acute renal failure   OSA on CPAP   Constipation  1. Persistent afib/ atypical atrial flutter Unable to pass TEE probe last week.  Barium swallow performed and plans for repeat TEE Monday were made (pt Dr Rennis Golden, he is scheduled for  2 oclock tomorrow) Continue xarelto  On amiodarone for rate control  Dr Bruna Potter long term plan was TEE guided cardioversion with long term amiodarone Will need to follow closely with Dr Ladona Ridgel and afib clinic upon discharge  2. Foley trauma Appreciate urology assistance  3. Abdominal distension Improved with BM Will follow with exam and advance diet as able  4. HTN Stable No change required today  5. DM Stable No change required today  6. Acute on chronic diastolic CHF (congestive heart failure)  Keep Is and Os about even  7. Acute renal failure improved  8. Constipation Improved  Dr Ladona Ridgel to follow  Hillis Range, MD 06/22/2015 12:30 PM

## 2015-06-22 NOTE — Progress Notes (Signed)
Subjective:  1 - Gross Hematuria / Urethral Trauma - new gross hematuria after catheter pulled out with inflated balloon. He is anticoagulated after recent cardioversion with non-reversible agent. 30F foley placed early AM 9/10 and irrigated modest amt clot. Started oxybutynin prn bladder spasms 9/10.   2 - Acute Renal Failure - Cr 3.8 06/19/15 up from baseline >1.5. Renal US w/o hydro. He has had RVR this admission with hypotension. No baseline voiding complaints / GU meds or h/o urinary retention. Now improving with hydration / conservative measures, now 1.1's.   3 - Left Peripelvic Renal Cyst - Left 1.7cm lower pole peri-pelvic cyst noted by [place] 06/2015, also retrospectivley noted by CT 2011.  Today "Thomas [name]" is seen in f/u above. Cr now back to baseline and excellent UOP. He continues to have significant discomfort from catheter. He is to have TEE tomorrow per report.  Objective: Vital signs in last 24 hours: Temp:  [98 F (36.7 C)-98.7 F (37.1 C)] 98.2 F (36.8 C) (09/11 0439) Pulse Rate:  [52-164] 62 (09/11 0439) Resp:  [14-25] 20 (09/11 0439) BP: (104-157)/(55-110) 114/86 mmHg (09/11 0439) SpO2:  [92 %-98 %] 95 % (09/11 0439) Weight:  [101.1 kg (222 lb 14.2 oz)] 101.1 kg (222 lb 14.2 oz) (09/11 0439) Last BM Date: 06/21/15  Intake/Output from previous day: 09/10 0701 - 09/11 0700 In: 720 [P.O.:720] Out: 1445 [Urine:1445] Intake/Output this shift:    General appearance: alert, cooperative, appears stated age and wife at bedside Eyes: negative Nose: Nares normal. Septum midline. Mucosa normal. No drainage or sinus tenderness. Throat: lips, mucosa, and tongue normal; teeth and gums normal Neck: supple, symmetrical, trachea midline Back: symmetric, no curvature. ROM normal. No CVA tenderness. Resp: non-labored on [place] O2 GI: soft, non-tender; bowel sounds normal; no masses,  no organomegaly Male genitalia: normal, foley c/d/i with clearing urine, no clots. Some scant dried blood  at urethral meatus.  Extremities: extremities normal, atraumatic, no cyanosis or edema Pulses: 2+ and symmetric Lymph nodes: Cervical, supraclavicular, and axillary nodes normal. Neurologic: Grossly normal  Lab Results:   Recent Labs  06/22/15 0436  WBC 12.3*  HGB 12.0*  HCT 36.0*  PLT 202   BMET  Recent Labs  06/21/15 0247 06/22/15 0436  NA 136 139  K 3.7 3.5  CL 101 100*  CO2 26 29  GLUCOSE 104* 62*  BUN 47* 37*  CREATININE 1.68* 1.16  CALCIUM 8.7* 9.3   PT/INR No results for input(s): LABPROT, INR in the last 72 hours. ABG No results for input(s): PHART, HCO3 in the last 72 hours.  Invalid input(s): PCO2, PO2  Studies/Results: Dg Abd Portable 1v  06/20/2015   CLINICAL DATA:  Abdominal pain.  Obstipation.  EXAM: PORTABLE ABDOMEN - 1 VIEW  COMPARISON:  09/16/2013  FINDINGS: Barium in the right colon. Retained stool in the right colon. The right and transverse colon and proximal left colon are dilated. The lower left colon and rectum are nondilated. No dilatation of the small bowel.  Right hip replacement.  No acute bony abnormality.  IMPRESSION: Barium and stool in the right colon. The colon is diffusely dilated most consistent with colonic ileus. Left colonic obstruction not excluded.   Electronically Signed   By: Marlan Palau M.D.   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 This will need  to stay in place until after fully recovered from TEE. Possibly DC 9/13.   He will likely have some scant bleeding around it and some pink-tinged urine but as long as urine flowing this is not concerning. I feel it is reasonable to continue anticoagulation for cardiac indication.   2 - Acute Renal Failure - resolved from likely pre-renal insult from recent cardiogenic hypotension.   3 - Left  Peripelvic Renal Cyst - stable and relatively non-complex on serial imaging, no mass effect, no further efaluation warranted.  [facility], Lisette Mancebo 06/22/2015

## 2015-06-22 NOTE — Progress Notes (Signed)
Patient stated that he does wear CPAP at home but does not wish to wear one tonight. He states he has enough problems and stuff going on that he wishes not to. RT made patient aware that if he changed his mind to call.

## 2015-06-23 ENCOUNTER — Inpatient Hospital Stay (HOSPITAL_COMMUNITY): Payer: Medicare Other | Admitting: Anesthesiology

## 2015-06-23 ENCOUNTER — Inpatient Hospital Stay (HOSPITAL_COMMUNITY): Payer: Medicare Other

## 2015-06-23 ENCOUNTER — Encounter (HOSPITAL_COMMUNITY): Payer: Self-pay | Admitting: Student

## 2015-06-23 ENCOUNTER — Encounter (HOSPITAL_COMMUNITY)
Admission: AD | Disposition: A | Discharge status: Home / Self Care | Payer: Self-pay | Source: Ambulatory Visit | Attending: Internal Medicine

## 2015-06-23 DIAGNOSIS — I340 Nonrheumatic mitral (valve) insufficiency: Secondary | ICD-10-CM

## 2015-06-23 DIAGNOSIS — N170 Acute kidney failure with tubular necrosis: Secondary | ICD-10-CM

## 2015-06-23 DIAGNOSIS — I4891 Unspecified atrial fibrillation: Secondary | ICD-10-CM

## 2015-06-23 DIAGNOSIS — E782 Mixed hyperlipidemia: Secondary | ICD-10-CM

## 2015-06-23 HISTORY — PX: CARDIOVERSION: SHX1299

## 2015-06-23 HISTORY — PX: TEE WITHOUT CARDIOVERSION: SHX5443

## 2015-06-23 LAB — GLUCOSE, CAPILLARY
Glucose-Capillary: 137 mg/dL — ABNORMAL HIGH (ref 65–99)
Glucose-Capillary: 52 mg/dL — ABNORMAL LOW (ref 65–99)
Glucose-Capillary: 71 mg/dL (ref 65–99)
Glucose-Capillary: 65 mg/dL (ref 65–99)
Glucose-Capillary: 199 mg/dL — ABNORMAL HIGH (ref 65–99)
Glucose-Capillary: 216 mg/dL — ABNORMAL HIGH (ref 65–99)

## 2015-06-23 LAB — UIFE/LIGHT CHAINS/TP QN, 24-HR UR
% BETA, Urine: 14.2 %
ALPHA 1 URINE: 1.5 %
ALPHA 2 UR: 7.4 %
Albumin, U: 58.9 %
GAMMA GLOBULIN URINE: 18 %
TOTAL PROTEIN, URINE-UPE24: 225.5 mg/dL

## 2015-06-23 SURGERY — ECHOCARDIOGRAM, TRANSESOPHAGEAL
Anesthesia: Monitor Anesthesia Care

## 2015-06-23 MED ORDER — LIDOCAINE HCL (CARDIAC) 20 MG/ML IV SOLN
INTRAVENOUS | Status: DC | PRN
  Administered 2015-06-23: 100 mg via INTRAVENOUS

## 2015-06-23 MED ORDER — PROPOFOL INFUSION 10 MG/ML OPTIME
INTRAVENOUS | Status: DC | PRN
  Administered 2015-06-23: 50 ug/kg/min via INTRAVENOUS

## 2015-06-23 MED ORDER — SODIUM CHLORIDE 0.9 % IV SOLN
INTRAVENOUS | Status: DC | PRN
  Administered 2015-06-23: 14:00:00 via INTRAVENOUS

## 2015-06-23 MED ORDER — DEXTROSE 50 % IV SOLN
INTRAVENOUS | Status: AC
  Administered 2015-06-23: 25 mL
  Filled 2015-06-23: qty 50

## 2015-06-23 MED ORDER — BUTAMBEN-TETRACAINE-BENZOCAINE 2-2-14 % EX AERO
INHALATION_SPRAY | CUTANEOUS | Status: DC | PRN
  Administered 2015-06-23: 2 via TOPICAL

## 2015-06-23 MED ORDER — ETOMIDATE 2 MG/ML IV SOLN
INTRAVENOUS | Status: DC | PRN
  Administered 2015-06-23: 4 mg via INTRAVENOUS

## 2015-06-23 MED ORDER — PHENYLEPHRINE HCL 10 MG/ML IJ SOLN
INTRAMUSCULAR | Status: DC | PRN
  Administered 2015-06-23: 80 ug via INTRAVENOUS

## 2015-06-23 NOTE — Transfer of Care (Signed)
Immediate Anesthesia Transfer of Care Note  Patient: Thomas [name]  Procedure(s) Performed: Procedure(s): TRANSESOPHAGEAL ECHOCARDIOGRAM (TEE) (N/A) CARDIOVERSION (N/A)  Patient Location: Endoscopy Unit  Anesthesia Type:MAC  Level of Consciousness: awake, alert , oriented and patient cooperative  Airway & Oxygen Therapy: Patient Spontanous Breathing and Patient connected to nasal cannula oxygen  Post-op Assessment: Report given to RN, Post -op Vital signs reviewed and stable and Patient moving all extremities X 4  Post vital signs: Reviewed and stable  Last Vitals:  Filed Vitals:   06/23/15 1416  BP: 126/80  Pulse: 70  Temp: 36.6 C  Resp: 19    Complications: No apparent anesthesia complications

## 2015-06-23 NOTE — Anesthesia Preprocedure Evaluation (Addendum)
Anesthesia Evaluation  Patient identified by MRN, date of birth, ID band Patient awake    Reviewed: Allergy & Precautions, NPO status , Patient's Chart, lab work & pertinent test results  History of Anesthesia Complications Negative for: history of anesthetic complications  Airway Mallampati: II  TM Distance: >3 FB Neck ROM: Full    Dental   Pulmonary shortness of breath, sleep apnea , former smoker,    + rhonchi  + decreased breath sounds      Cardiovascular hypertension, + CAD, + Peripheral Vascular Disease and +CHF  + dysrhythmias  Rhythm:Irregular Rate:Normal     Neuro/Psych    GI/Hepatic   Endo/Other  diabetes  Renal/GU Renal InsufficiencyRenal disease     Musculoskeletal  (+) Arthritis ,   Abdominal (+) + obese,   Peds  Hematology   Anesthesia Other Findings   Reproductive/Obstetrics                            Anesthesia Physical Anesthesia Plan  ASA: IV  Anesthesia Plan: MAC   Post-op Pain Management:    Induction: Intravenous  Airway Management Planned: Nasal Cannula  Additional Equipment:   Intra-op Plan:   Post-operative Plan:   Informed Consent: I have reviewed the patients History and Physical, chart, labs and discussed the procedure including the risks, benefits and alternatives for the proposed anesthesia with the patient or authorized representative who has indicated his/her understanding and acceptance.   Dental advisory given  Plan Discussed with: CRNA and Surgeon  Anesthesia Plan Comments:         Anesthesia Quick Evaluation

## 2015-06-23 NOTE — Progress Notes (Addendum)
Inpatient Diabetes Program Recommendations  AACE/ADA: New Consensus Statement on Inpatient Glycemic Control (2013)  Target Ranges:  Prepandial:   less than 140 mg/dL      Peak postprandial:   less than 180 mg/dL (1-2 hours)      Critically ill patients:  140 - 180 mg/dL   Review of Glycemic control:  Results for RIDWAN, BONDY (MRN [id number]) as of 06/23/2015 11:11  Ref. Range 06/21/2015 16:34 06/21/2015 21:54 06/22/2015 06:37 06/22/2015 16:03 06/22/2015 21:23 06/22/2015 21:46 06/23/2015 06:30 06/23/2015 06:58  Glucose-Capillary Latest Ref Range: 65-99 mg/dL 81 80 409 (H) 69 75 811 (H) 52 (L) 137 (H)   Diabetes history: Type 2 diabetes Outpatient Diabetes medications: Prior to hospitalization: U500 (insulin pen) 150 units with breakfast, U500 70 units with supper, and Metformin 1000 mg BID  Current orders for Inpatient glycemic control:  U500 insulin 100 units with breakfast and 50 units q HS  Note that patient did not receive U500 last evening or this morning due to NPO for procedure.  Blood sugars have been lower than 100 mg/dL.  May consider reducing U500 dose further to 80 units with breakfast and 25 units with supper.  Thanks, Beryl Meager, RN, BC-ADM Inpatient Diabetes Coordinator Pager [phone]

## 2015-06-23 NOTE — Progress Notes (Signed)
Patient Name: Thomas [name] Date of Encounter: 06/23/2015  Principal Problem:   Atrial flutter with rapid ventricular response Active Problems:   Diabetes mellitus   Essential hypertension   Mixed hyperlipidemia   Acute on chronic diastolic CHF (congestive heart failure), NYHA class 1   Acute renal failure   OSA on CPAP   Constipation     Primary Cardiologist: Dr. Ladona Ridgel Dr. Eldridge Dace Patient Profile: 74 yo male w/ PMH of CAD, PAD, HTN, HLD, and atrial fluter (s/p ablation 2008) who was admitted on 06/17/2015 for atrial flutter. Unable to perform TEE/DCCV on 06/19/2015 due to not being able to pass the probe past 25cm. Esophagogram on 06/19/2015 without diverticulum or stricture. TEE/DCCV planned for 06/23/2015 at 1400.  SUBJECTIVE: Still complaining of abdominal pain and penile pain related to catheter placement. Had small bowel movement yesterday and flatulence this morning. Denies any chest pain, palpitations, or dyspnea.  OBJECTIVE Filed Vitals:   06/22/15 2010 06/22/15 2023 06/23/15 0500 06/23/15 0639  BP: 126/66   122/61  Pulse: 71     Temp: 98.4 F (36.9 C)   98.1 F (36.7 C)  TempSrc: Oral   Oral  Resp: 18   18  Height:      Weight:   221 lb 1.9 oz (100.3 kg)   SpO2: 99% 97%  98%    Intake/Output Summary (Last 24 hours) at 06/23/15 0846 Last data filed at 06/23/15 0730  Gross per 24 hour  Intake    480 ml  Output   1200 ml  Net   -720 ml   Filed Weights   06/21/15 0401 06/22/15 0439 06/23/15 0500  Weight: 220 lb 14.4 oz (100.2 kg) 222 lb 14.2 oz (101.1 kg) 221 lb 1.9 oz (100.3 kg)    PHYSICAL EXAM General: Well developed, well nourished, male in no acute distress. Head: Normocephalic, atraumatic.  Neck: Supple without bruits, JVD not elevated. Lungs:  Resp regular and unlabored, Rales at bases bilaterally. No wheezing noted on auscultation. Heart: Irregularly irregular, S1, S2, no S3, S4, or murmur; no rub. Abdomen: Distended with hypoactive bowel sounds.  No hepatomegaly. No rebound/guarding. No obvious abdominal masses. Extremities: No clubbing, cyanosis, or edema. Distal pedal pulses are 2+ bilaterally. Neuro: Alert and oriented X 3. Moves all extremities spontaneously. Psych: Normal affect.   LABS: CBC: Recent Labs  06/22/15 0436  WBC 12.3*  HGB 12.0*  HCT 36.0*  MCV 95.7  PLT 202   Basic Metabolic Panel: Recent Labs  06/21/15 0247 06/22/15 0436  NA 136 139  K 3.7 3.5  CL 101 100*  CO2 26 29  GLUCOSE 104* 62*  BUN 47* 37*  CREATININE 1.68* 1.16  CALCIUM 8.7* 9.3  PHOS 4.0 2.8   Liver Function Tests: Recent Labs  06/21/15 0247 06/22/15 0436  ALBUMIN 3.7 3.6    TELE:  Atrial fibrillation with rate controlled mostly in the 80's. Episodes of HR in 110's for several minutes.       ECHO: 06/18/2015 Study Conclusions - Left ventricle: The cavity size was mildly dilated. There was mild concentric hypertrophy. Systolic function was mildly to moderately reduced. The estimated ejection fraction was in the range of 40% to 45%. The study was not technically sufficient to allow evaluation of LV diastolic dysfunction due to atrial fibrillation. - Aortic root: The aortic root was normal in size. - Mitral valve: There was trivial regurgitation. - Left atrium: The atrium was moderately dilated. - Right ventricle: Systolic function was mildly reduced. -  Right atrium: The atrium was normal in size. - Tricuspid valve: There was mild regurgitation. - Pulmonary arteries: Systolic pressure was mildly increased. PA peak pressure: 42 mm Hg (S). - Inferior vena cava: The vessel was dilated. The respirophasic diameter changes were blunted (< 50%), consistent with elevated central venous pressure. - Pericardium, extracardiac: There was no pericardial effusion.  Impressions: - When compared to the prior study from 2014 there is now mild to moderate LVEF systolic function impairement with regional wall motion  abnormalities and mild RV systolic dysfunction.   Current Medications:  . amiodarone  400 mg Oral Daily  . antiseptic oral rinse  7 mL Mouth Rinse BID  . atorvastatin  20 mg Oral Daily  . diltiazem  60 mg Oral QID  . gabapentin  300 mg Oral TID  . Influenza vac split quadrivalent PF  0.5 mL Intramuscular Tomorrow-1000  . insulin aspart  0-15 Units Subcutaneous TID WC  . insulin regular human CONCENTRATED  100 Units Subcutaneous Q breakfast  . insulin regular human CONCENTRATED  50 Units Subcutaneous QHS  . levalbuterol  0.63 mg Nebulization Q6H  . metoprolol succinate  75 mg Oral Daily  . rivaroxaban  20 mg Oral Q supper  . sodium chloride  3 mL Intravenous Q12H  . temazepam  15 mg Oral QHS   . sodium chloride    . nitroGLYCERIN Stopped (06/18/15 1200)    ASSESSMENT AND PLAN:  1. Persistent afib/ atypical atrial flutter - Unable to perform TEE/DCCV on 06/19/2015 due to not being able to pass the probe past 25cm. Esophagogram on 06/19/2015 without diverticulum or stricture. TEE/DCCV planned for 06/23/2015 at 1400. - Continue Amiodarone and Xarelto. Xarelto recently increased to  daily on 06/22/2015. - Will need to follow closely with Dr. Ladona Ridgel and afib clinic upon discharge  2. Foley trauma - Appreciate urology assistance. - Still complaining of burning penile pain secondary to catheter placement.  3. Mixed Hyperlipidemia - continue statin therapy  4. Essential Hypertension - BP has been 122/61 - 127/68 in the past 24 hours. - Continue current medical therapy.  5. Diabetes Mellitus - Continue current medical therapy  6. Acute on chronic diastolic CHF (congestive heart failure)  - Has been -3.8L since admission  7. Acute renal failure - Likely due to overdiuersis. - Creatinine bumped to 3.78 on 06/19/2015. Improved to 1.16 on 06/22/2015.  8. OSA - On CPAP  9. Abdominal Distension - Still present with hypoactive bowel sounds - Had BM yesterday, flatulence today. Has  been NPO since midnight in preparation for procedure.  Lorri Frederick , PA-C 8:46 AM 06/23/2015 Pager: [phone]   The patient was seen, examined and discussed with Brittainy M. Sharol Harness, PA-C and I agree with the above.   74 year old male with persistent afib/ atypical atrial flutter, failed TEE/DCCV on 06/19/2015 due to not being able to pass the probe, rescheduled for 2 pm today. Today in a-fib, rate controlled. On Amiodarone and Xarelto. Will need to follow closely with Dr. Ladona Ridgel and afib clinic upon discharge BP controlled. H/O chronic systolic CHF, appears euvolemic.   Lars Masson 06/23/2015

## 2015-06-23 NOTE — H&P (View-Only) (Signed)
Patient Name: Thomas [name] Date of Encounter: 06/23/2015  Principal Problem:   Atrial flutter with rapid ventricular response Active Problems:   Diabetes mellitus   Essential hypertension   Mixed hyperlipidemia   Acute on chronic diastolic CHF (congestive heart failure), NYHA class 1   Acute renal failure   OSA on CPAP   Constipation     Primary Cardiologist: Dr. Ladona Ridgel Dr. Eldridge Dace Patient Profile: 74 yo male w/ PMH of CAD, PAD, HTN, HLD, and atrial fluter (s/p ablation 2008) who was admitted on 06/17/2015 for atrial flutter. Unable to perform TEE/DCCV on 06/19/2015 due to not being able to pass the probe past 25cm. Esophagogram on 06/19/2015 without diverticulum or stricture. TEE/DCCV planned for 06/23/2015 at 1400.  SUBJECTIVE: Still complaining of abdominal pain and penile pain related to catheter placement. Had small bowel movement yesterday and flatulence this morning. Denies any chest pain, palpitations, or dyspnea.  OBJECTIVE Filed Vitals:   06/22/15 2010 06/22/15 2023 06/23/15 0500 06/23/15 0639  BP: 126/66   122/61  Pulse: 71     Temp: 98.4 F (36.9 C)   98.1 F (36.7 C)  TempSrc: Oral   Oral  Resp: 18   18  Height:      Weight:   221 lb 1.9 oz (100.3 kg)   SpO2: 99% 97%  98%    Intake/Output Summary (Last 24 hours) at 06/23/15 0846 Last data filed at 06/23/15 0730  Gross per 24 hour  Intake    480 ml  Output   1200 ml  Net   -720 ml   Filed Weights   06/21/15 0401 06/22/15 0439 06/23/15 0500  Weight: 220 lb 14.4 oz (100.2 kg) 222 lb 14.2 oz (101.1 kg) 221 lb 1.9 oz (100.3 kg)    PHYSICAL EXAM General: Well developed, well nourished, male in no acute distress. Head: Normocephalic, atraumatic.  Neck: Supple without bruits, JVD not elevated. Lungs:  Resp regular and unlabored, Rales at bases bilaterally. No wheezing noted on auscultation. Heart: Irregularly irregular, S1, S2, no S3, S4, or murmur; no rub. Abdomen: Distended with hypoactive bowel sounds.  No hepatomegaly. No rebound/guarding. No obvious abdominal masses. Extremities: No clubbing, cyanosis, or edema. Distal pedal pulses are 2+ bilaterally. Neuro: Alert and oriented X 3. Moves all extremities spontaneously. Psych: Normal affect.   LABS: CBC: Recent Labs  06/22/15 0436  WBC 12.3*  HGB 12.0*  HCT 36.0*  MCV 95.7  PLT 202   Basic Metabolic Panel: Recent Labs  06/21/15 0247 06/22/15 0436  NA 136 139  K 3.7 3.5  CL 101 100*  CO2 26 29  GLUCOSE 104* 62*  BUN 47* 37*  CREATININE 1.68* 1.16  CALCIUM 8.7* 9.3  PHOS 4.0 2.8   Liver Function Tests: Recent Labs  06/21/15 0247 06/22/15 0436  ALBUMIN 3.7 3.6    TELE:  Atrial fibrillation with rate controlled mostly in the 80's. Episodes of HR in 110's for several minutes.       ECHO: 06/18/2015 Study Conclusions - Left ventricle: The cavity size was mildly dilated. There was mild concentric hypertrophy. Systolic function was mildly to moderately reduced. The estimated ejection fraction was in the range of 40% to 45%. The study was not technically sufficient to allow evaluation of LV diastolic dysfunction due to atrial fibrillation. - Aortic root: The aortic root was normal in size. - Mitral valve: There was trivial regurgitation. - Left atrium: The atrium was moderately dilated. - Right ventricle: Systolic function was mildly reduced. -  Right atrium: The atrium was normal in size. - Tricuspid valve: There was mild regurgitation. - Pulmonary arteries: Systolic pressure was mildly increased. PA peak pressure: 42 mm Hg (S). - Inferior vena cava: The vessel was dilated. The respirophasic diameter changes were blunted (< 50%), consistent with elevated central venous pressure. - Pericardium, extracardiac: There was no pericardial effusion.  Impressions: - When compared to the prior study from 2014 there is now mild to moderate LVEF systolic function impairement with regional wall motion  abnormalities and mild RV systolic dysfunction.   Current Medications:  . amiodarone  400 mg Oral Daily  . antiseptic oral rinse  7 mL Mouth Rinse BID  . atorvastatin  20 mg Oral Daily  . diltiazem  60 mg Oral QID  . gabapentin  300 mg Oral TID  . Influenza vac split quadrivalent PF  0.5 mL Intramuscular Tomorrow-1000  . insulin aspart  0-15 Units Subcutaneous TID WC  . insulin regular human CONCENTRATED  100 Units Subcutaneous Q breakfast  . insulin regular human CONCENTRATED  50 Units Subcutaneous QHS  . levalbuterol  0.63 mg Nebulization Q6H  . metoprolol succinate  75 mg Oral Daily  . rivaroxaban  20 mg Oral Q supper  . sodium chloride  3 mL Intravenous Q12H  . temazepam  15 mg Oral QHS   . sodium chloride    . nitroGLYCERIN Stopped (06/18/15 1200)    ASSESSMENT AND PLAN:  1. Persistent afib/ atypical atrial flutter - Unable to perform TEE/DCCV on 06/19/2015 due to not being able to pass the probe past 25cm. Esophagogram on 06/19/2015 without diverticulum or stricture. TEE/DCCV planned for 06/23/2015 at 1400. - Continue Amiodarone and Xarelto. Xarelto recently increased to  daily on 06/22/2015. - Will need to follow closely with Dr. Ladona Ridgel and afib clinic upon discharge  2. Foley trauma - Appreciate urology assistance. - Still complaining of burning penile pain secondary to catheter placement.  3. Mixed Hyperlipidemia - continue statin therapy  4. Essential Hypertension - BP has been 122/61 - 127/68 in the past 24 hours. - Continue current medical therapy.  5. Diabetes Mellitus - Continue current medical therapy  6. Acute on chronic diastolic CHF (congestive heart failure)  - Has been -3.8L since admission  7. Acute renal failure - Likely due to overdiuersis. - Creatinine bumped to 3.78 on 06/19/2015. Improved to 1.16 on 06/22/2015.  8. OSA - On CPAP  9. Abdominal Distension - Still present with hypoactive bowel sounds - Had BM yesterday, flatulence today. Has  been NPO since midnight in preparation for procedure.  Lorri Frederick , PA-C 8:46 AM 06/23/2015 Pager: [phone]   The patient was seen, examined and discussed with Brittainy M. Sharol Harness, PA-C and I agree with the above.   74 year old male with persistent afib/ atypical atrial flutter, failed TEE/DCCV on 06/19/2015 due to not being able to pass the probe, rescheduled for 2 pm today. Today in a-fib, rate controlled. On Amiodarone and Xarelto. Will need to follow closely with Dr. Ladona Ridgel and afib clinic upon discharge BP controlled. H/O chronic systolic CHF, appears euvolemic.   Lars Masson 06/23/2015

## 2015-06-23 NOTE — CV Procedure (Signed)
    Transesophageal Echocardiogram Note  Jovanny Stephanie [id number] 02-27-1941  Procedure: Transesophageal Echocardiogram Indications: atrial fib   Procedure Details Consent: Obtained Time Out: Verified patient identification, verified procedure, site/side was marked, verified correct patient position, special equipment/implants available, Radiology Safety Procedures followed,  medications/allergies/relevent history reviewed, required imaging and test results available.  Performed  The patient was scheduled for TEE guided cardioversion last week but the procedure was aborted due to difficulty in passing the scope. A barium swallow did not reveal any structural abnormalities and the procedure was rescheduled for today    Medications: Propofol 65 mg total  Etomidate 4 mg iv Lidocaine 100 mg iv Neosynephrine 80 mcg    Left Ventrical:  Moderate LV dysfunction - EF 35-40%   Mitral Valve: normal MV   Aortic Valve: normal AV   Tricuspid Valve: normal TV  Pulmonic Valve: normal   Left Atrium/ Left atrial appendage: no thrombus   Atrial septum:  No PFO by color doppler   Aorta: clacified    Complications: No apparent complications.  There was a scant amount of blood suctioned out of his throat following the procedure  Patient did tolerate procedure well.     Cardioversion Note  Esten Dollar [id number] 05-20-1941  Procedure: DC Cardioversion Indications: atrial fib   Procedure Details Consent: Obtained Time Out: Verified patient identification, verified procedure, site/side was marked, verified correct patient position, special equipment/implants available, Radiology Safety Procedures followed,  medications/allergies/relevent history reviewed, required imaging and test results available.  Performed  The patient has been on adequate anticoagulation.  The patient received IV propofol ( see above )  for sedation.  Synchronous cardioversion was performed at 120   joules.  The cardioversion was successful     Complications: No apparent complications Patient did tolerate procedure well.   Vesta Mixer, Montez Hageman., MD, [facility] 06/23/2015, 2:07 PM

## 2015-06-23 NOTE — Interval H&P Note (Signed)
History and Physical Interval Note:  06/23/2015 1:38 PM  Thomas [name]  has presented today for surgery, with the diagnosis of afib  The various methods of treatment have been discussed with the patient and family. After consideration of risks, benefits and other options for treatment, the patient has consented to  Procedure(s): TRANSESOPHAGEAL ECHOCARDIOGRAM (TEE) (N/A) CARDIOVERSION (N/A) as a surgical intervention .  The patient's history has been reviewed, patient examined, no change in status, stable for surgery.  I have reviewed the patient's chart and labs.  Questions were answered to the patient's satisfaction.     Carmin [name], Deloris Ping

## 2015-06-23 NOTE — Progress Notes (Signed)
RT note: Placed patient on CPAP 6 CMH2O with nasal mask per RCP protocol. Patient tolerating well vital signs stable. HR 71 rate 16 sats 94% on room air.

## 2015-06-23 NOTE — Progress Notes (Signed)
Hypoglycemic Event  CBG: 52  Treatment: 25ml d50  Symptoms: n/a  Follow-up CBG: Time: 0658   CBG Result: 137  Possible Reasons for Event: NPO  Insulin was held last night due to pts NPO status.  Cailyn Houdek B

## 2015-06-23 NOTE — Anesthesia Postprocedure Evaluation (Signed)
  Anesthesia Post-op Note  Patient: Thomas [name]  Procedure(s) Performed: Procedure(s): TRANSESOPHAGEAL ECHOCARDIOGRAM (TEE) (N/A) CARDIOVERSION (N/A)  Patient Location: Endoscopy Unit  Anesthesia Type:MAC  Level of Consciousness: awake, alert , oriented and patient cooperative  Airway and Oxygen Therapy: Patient Spontanous Breathing and Patient connected to nasal cannula oxygen  Post-op Pain: 0 /10  Post-op Assessment: Post-op Vital signs reviewed, Patient's Cardiovascular Status Stable, Respiratory Function Stable, Patent Airway and No signs of Nausea or vomiting              Post-op Vital Signs: Reviewed and stable  Last Vitals:  Filed Vitals:   06/23/15 1416  BP: 126/80  Pulse: 70  Temp: 36.6 C  Resp: 19    Complications: No apparent anesthesia complications

## 2015-06-23 NOTE — Care Management Note (Signed)
Case Management Note  Patient Details  Name: Thomas [name] MRN: [id number] Date of Birth: 1941-10-06  Subjective/Objective:  Pt admitted with Aflutter, pt will undergo cardioversion this admit                  Action/Plan:   Pt is independent from home with wife.  CM will provide HF screening.  CM will continue to monitor for disposition needs   Expected Discharge Date:                  Expected Discharge Plan:  Home w Home Health Services  In-House Referral:     Discharge planning Services  CM Consult  Post Acute Care Choice:    Choice offered to:     DME Arranged:    DME Agency:     HH Arranged:    HH Agency:     Status of Service:   In progress, will continue to monitor  Medicare Important Message Given:  Yes-second notification given Date Medicare IM Given:    Medicare IM give by:    Date Additional Medicare IM Given:    Additional Medicare Important Message give by:     If discussed at Long Length of Stay Meetings, dates discussed:    Additional Comments: CM assessed pt with wife at bedside.  Pt stated he has scale but does not currently weigh self everyday, CM educated pt on importance of daily weights with prompt PCP follow up with sudden weigh gains, pt stated that he tries to adhere to low sodium diet, CM educated on the dangers of high salt content in a HF patient.   Cherylann Parr, RN 06/23/2015, 12:26 PM

## 2015-06-24 ENCOUNTER — Inpatient Hospital Stay (HOSPITAL_COMMUNITY): Payer: Medicare Other

## 2015-06-24 ENCOUNTER — Encounter (HOSPITAL_COMMUNITY): Payer: Self-pay | Admitting: Student

## 2015-06-24 LAB — GLUCOSE, CAPILLARY
Glucose-Capillary: 58 mg/dL — ABNORMAL LOW (ref 65–99)
Glucose-Capillary: 76 mg/dL (ref 65–99)
Glucose-Capillary: 188 mg/dL — ABNORMAL HIGH (ref 65–99)

## 2015-06-24 LAB — RENAL FUNCTION PANEL
ALBUMIN: 3.5 g/dL (ref 3.5–5.0)
ANION GAP: 8 (ref 5–15)
BUN: 25 mg/dL — AB (ref 6–20)
CO2: 31 mmol/L (ref 22–32)
Calcium: 9.1 mg/dL (ref 8.9–10.3)
Chloride: 101 mmol/L (ref 101–111)
Creatinine, Ser: 1.28 mg/dL — ABNORMAL HIGH (ref 0.61–1.24)
GFR, EST NON AFRICAN AMERICAN: 55 mL/min — AB (ref >60)
Glucose, Bld: 62 mg/dL — ABNORMAL LOW (ref 65–99)
PHOSPHORUS: 3.6 mg/dL (ref 2.5–4.6)
POTASSIUM: 4.9 mmol/L (ref 3.5–5.1)
Sodium: 140 mmol/L (ref 135–145)

## 2015-06-24 MED ORDER — NITROGLYCERIN 0.4 MG SL SUBL: 0.4000 mg | SUBLINGUAL_TABLET | SUBLINGUAL | Status: AC | PRN

## 2015-06-24 MED ORDER — AMIODARONE HCL 400 MG PO TABS: 400.0000 mg | ORAL_TABLET | Freq: Every day | ORAL | Status: AC

## 2015-06-24 MED ORDER — INSULIN REGULAR HUMAN (CONC) 500 UNIT/ML ~~LOC~~ SOLN: 80.0000 [IU] | Freq: Every day | SUBCUTANEOUS | Status: DC

## 2015-06-24 MED ORDER — METOPROLOL SUCCINATE ER 25 MG PO TB24: 75.0000 mg | ORAL_TABLET | Freq: Every day | ORAL | Status: AC

## 2015-06-24 NOTE — Progress Notes (Signed)
Hypoglycemic Event  CBG: 58  Treatment: 8 oz of orange juice, graham crackers  Symptoms: none  Follow-up CBG: Time: 0656 CBG Result:76 Possible Reasons for Event: humulin R 50 units may need to be adjusted  Comments/MD notified:    Nikki Dom  Remember to initiate Hypoglycemia Order Set & complete

## 2015-06-24 NOTE — Evaluation (Signed)
Physical Therapy Evaluation Patient Details Name: Thomas [name] MRN: [id number] DOB: Dec 05, 1940 Today's Date: 06/24/2015   History of Present Illness  Patient is a 74 y.o. male with a PMH of  CAD, s/p multiple stents placement, the last cath in 2008, PAD, HTN, HLP, atrial flutter who underwent catheter ablation of typical atrial flutter over 8 years ago. He did well but has had recurrent symptoms and found to have atypical flutter on several occasions. He has been out of rhythm for about a week and was seen by Dr Ladona Ridgel on 06/11/15 and scheduled for a TEE/DCCV. In the meantime he has developed progressively worsening SOB, 4 pillow orthopnea, PND and chest pain - retrosternal and left sided, pressure like. Pt is SOB at rest and was found to have bronchitis. Pt is now s/p cardioversion on 06/23/15.  Clinical Impression  Pt admitted with above diagnosis. Pt currently with functional limitations due to the deficits listed below (see PT Problem List). At the time of PT eval pt was able to perform transfers and ambulation with close guard for safety. Pt was educated on risk of falling and recommended use of RW at home for balance and energy conservation. Pt reports he has been going to outpatient PT for his back recently, and discussed the benefits of continued therapy follow-up at d/c to return to PLOF. Pt will benefit from skilled PT to increase their independence and safety with mobility to allow discharge to the venue listed below.       Follow Up Recommendations Outpatient PT;Supervision for mobility/OOB    Equipment Recommendations  None recommended by PT    Recommendations for Other Services       Precautions / Restrictions Precautions Precautions: Fall Restrictions Weight Bearing Restrictions: No      Mobility  Bed Mobility               General bed mobility comments: Pt sitting EOB when PT arrived.   Transfers Overall transfer level: Modified independent Equipment used:  None                Ambulation/Gait Ambulation/Gait assistance: Min guard Ambulation Distance (Feet): 200 Feet Assistive device: None Gait Pattern/deviations: Step-through pattern;Decreased stride length;Trendelenburg;Wide base of support Gait velocity: Decreased Gait velocity interpretation: Below normal speed for age/gender General Gait Details: Pt appears unsteady and often reaches for the wall/handrail/desk for support. Frequent standing rest breaks for DOE 2/4. Pt did not require assistance to recover from balance disturbances, however close guard provided for safety.   Stairs            Wheelchair Mobility    Modified Rankin (Stroke Patients Only)       Balance Overall balance assessment: Needs assistance         Standing balance support: No upper extremity supported Standing balance-Leahy Scale: Poor Standing balance comment: Reaches for UE support during dynamic standing activity                             Pertinent Vitals/Pain Pain Assessment: No/denies pain    Home Living Family/patient expects to be discharged to:: Private residence Living Arrangements: Spouse/significant other Available Help at Discharge: Family;Available 24 hours/day Type of Home: House Home Access: Stairs to enter Entrance Stairs-Rails: None Entrance Stairs-Number of Steps: 1 Home Layout: One level Home Equipment: Walker - 2 wheels;Cane - single point;Bedside commode;Shower seat - built in;Shower seat      Prior Function Level of Independence: Independent  with assistive device(s)         Comments: SPC all the time     Hand Dominance        Extremity/Trunk Assessment   Upper Extremity Assessment: Defer to OT evaluation           Lower Extremity Assessment: Generalized weakness (R hip weakness per pt from previous THA)      Cervical / Trunk Assessment: Normal  Communication   Communication: HOH  Cognition Arousal/Alertness:  Awake/alert Behavior During Therapy: WFL for tasks assessed/performed Overall Cognitive Status: Within Functional Limits for tasks assessed                      General Comments      Exercises        Assessment/Plan    PT Assessment Patient needs continued PT services  PT Diagnosis Difficulty walking;Generalized weakness   PT Problem List Decreased strength;Decreased range of motion;Decreased activity tolerance;Decreased balance;Decreased mobility;Decreased knowledge of use of DME;Decreased safety awareness;Decreased knowledge of precautions;Cardiopulmonary status limiting activity  PT Treatment Interventions DME instruction;Gait training;Stair training;Functional mobility training;Therapeutic activities;Therapeutic exercise;Neuromuscular re-education;Patient/family education   PT Goals (Current goals can be found in the Care Plan section) Acute Rehab PT Goals Patient Stated Goal: Home today PT Goal Formulation: With patient/family Time For Goal Achievement: 06/27/15 Potential to Achieve Goals: Good    Frequency Min 3X/week   Barriers to discharge        Co-evaluation               End of Session Equipment Utilized During Treatment: Gait belt Activity Tolerance: Patient tolerated treatment well Patient left: in bed;with call bell/phone within reach;with family/visitor present;with nursing/sitter in room Nurse Communication: Mobility status         Time: 1610-9604 PT Time Calculation (min) (ACUTE ONLY): 21 min   Charges:   PT Evaluation $Initial PT Evaluation Tier I: 1 Procedure     PT G CodesConni Slipper 07/07/15, 3:14 PM   Conni Slipper, PT, DPT Acute Rehabilitation Services Pager: [phone]

## 2015-06-24 NOTE — Progress Notes (Signed)
Patient Name: Thomas [name] Date of Encounter: 06/24/2015  Principal Problem:   Atrial flutter with rapid ventricular response Active Problems:   Diabetes mellitus   Essential hypertension   Mixed hyperlipidemia   Acute on chronic diastolic CHF (congestive heart failure), NYHA class 1   Acute renal failure   OSA on CPAP   Constipation    Primary Cardiologist: Dr. Ladona Ridgel Dr. Eldridge Dace Patient Profile: 74 yo male w/ PMH of CAD, PAD, HTN, HLD, and atrial fluter (s/p ablation 2008) who was admitted on 06/17/2015 for atrial flutter. Unable to perform TEE/DCCV on 06/19/2015 due to not being able to pass the probe past 25cm. Esophagogram on 06/19/2015 without diverticulum or stricture. Successful TEE/DCCV performed on  06/23/2015 with return to NSR.  SUBJECTIVE: Reports having trouble breathing over the past few days, continuing into this morning. Has been having nebulizer treatments by respiratory therapy. Still having penile pain surrounding catheter. Had two bowel movements yesterday and reports improvement in his abdominal pain.  OBJECTIVE Filed Vitals:   06/23/15 1430 06/23/15 1440 06/23/15 2029 06/24/15 0430  BP: 128/77  117/66 121/66  Pulse: 74 77 75 70  Temp:   97.7 F (36.5 C) 98.3 F (36.8 C)  TempSrc:   Oral Oral  Resp: Height:      Weight:    220 lb 7.4 oz (100 kg)  SpO2: 98% 97% 99% 99%    Intake/Output Summary (Last 24 hours) at 06/24/15 0830 Last data filed at 06/24/15 0431  Gross per 24 hour  Intake    320 ml  Output    700 ml  Net   -380 ml   Filed Weights   06/22/15 0439 06/23/15 0500 06/24/15 0430  Weight: 222 lb 14.2 oz (101.1 kg) 221 lb 1.9 oz (100.3 kg) 220 lb 7.4 oz (100 kg)    PHYSICAL EXAM General: Well developed, well nourished, male in no acute distress. Head: Normocephalic, atraumatic.  Neck: Supple without bruits, JVD not elevated. Lungs:  Resp regular and unlabored, rales at bases.  Heart: RRR, S1, S2, no S3, S4, or murmur; no  rub. Abdomen: Soft, non-tender, hypoactive bowel sounds. Extremities: No clubbing, cyanosis, or edema. Distal pedal pulses are 2+ bilaterally. Neuro: Alert and oriented X 3. Moves all extremities spontaneously. Psych: Normal affect.  LABS: CBC: Recent Labs  06/22/15 0436  WBC 12.3*  HGB 12.0*  HCT 36.0*  MCV 95.7  PLT 202   Basic Metabolic Panel: Recent Labs  06/22/15 0436 06/24/15 0441  NA 139 140  K 3.5 4.9  CL 100* 101  CO2 29 31  GLUCOSE 62* 62*  BUN 37* 25*  CREATININE 1.16 1.28*  CALCIUM 9.3 9.1  PHOS 2.8 3.6   Liver Function Tests: Recent Labs  06/22/15 0436 06/24/15 0441  ALBUMIN 3.6 3.5   TELE:   Normal Sinus Rhythm with rate in 60's - 70's. No atopic events.      TEE/ DCCV: 06/23/2015 Study Conclusions - Left ventricle: The cavity size was normal. Systolic function was moderately reduced. The estimated ejection fraction was in the range of 35% to 40%. No evidence of thrombus. - Aortic valve: No evidence of vegetation. - Mitral valve: There was moderate regurgitation. - Left atrium: No evidence of thrombus in the atrial cavity or appendage. No evidence of thrombus in the atrial cavity or appendage. No evidence of thrombus in the atrial cavity or appendage. - Atrial septum: No defect or patent foramen ovale was identified. -  Tricuspid valve: No evidence of vegetation.  Impressions: - Successful cardioversion. No cardiac source of emboli was indentified.   The patient has been on adequate anticoagulation. The patient received IV propofol ( see above ) for sedation. Synchronous cardioversion was performed at 120 joules.  The cardioversion was successful.  Complications: No apparent complications. Patient did tolerate procedure well.  Current Medications:  . amiodarone  400 mg Oral Daily  . antiseptic oral rinse  7 mL Mouth Rinse BID  . atorvastatin  20 mg Oral Daily  . diltiazem  60 mg Oral QID  . gabapentin  300 mg Oral TID   . Influenza vac split quadrivalent PF  0.5 mL Intramuscular Tomorrow-1000  . insulin aspart  0-15 Units Subcutaneous TID WC  . insulin regular human CONCENTRATED  100 Units Subcutaneous Q breakfast  . insulin regular human CONCENTRATED  50 Units Subcutaneous QHS  . levalbuterol  0.63 mg Nebulization Q6H  . metoprolol succinate  75 mg Oral Daily  . rivaroxaban  20 mg Oral Q supper  . sodium chloride  3 mL Intravenous Q12H  . temazepam  15 mg Oral QHS   . nitroGLYCERIN Stopped (06/18/15 1200)    ASSESSMENT AND PLAN:  1. Persistent afib/ atypical atrial flutter - Unable to perform TEE/DCCV on 06/19/2015 due to not being able to pass the probe past 25cm. Esophagogram on 06/19/2015 without diverticulum or stricture. Successful TEE/DCCV performed 06/23/2015 with return to NSR. - Continue Amiodarone and Xarelto. Xarelto recently increased to 20mg  daily on 06/22/2015. - Will need to follow closely with Dr. Ladona Ridgel and afib clinic upon discharge  2. Foley trauma - Still complaining of burning penile pain secondary to catheter placement. - Spoke with Dr. Berneice Heinrich this morning and he said foley could be removed at this time by nursing staff and to follow-up with him on a PRN basis.   3. Shortness of Breath - patient has been having trouble breathing, at rest and with exertion.  - Minimal rales at bases. Weight has remained stable and he does not appear overly volume overloaded on exam.  - Continue Xopenex nebulizer treatments. - Will obtain CXR. May benefit from readministration of Lasix but will consider once CXR obtained. Will need to monitor renal function closely.  4. Mixed Hyperlipidemia - continue statin therapy  5. Essential Hypertension - BP has been 117/66 - 128/80 in the past 24 hours. - Continue current medical therapy.  6. Diabetes Mellitus - U500 reduced from 100U to 80 units with breakfast due to episodes of hypoglycemia.  7. Acute on chronic diastolic CHF (congestive heart  failure)  - Has been -4.2L since admission  8. Acute kidney injury - Likely due to overdiuersis. - Creatinine bumped to 3.78 on 06/19/2015. Improved to 1.28 on 06/24/2015.  9. OSA - On CPAP  10. Abdominal Distension - Still present with hypoactive bowel sounds - Reports improvement in his abdominal pain following two bowel movements yesterday.  Signed, Ellsworth Lennox , PA-C 8:30 AM 06/24/2015 Pager: [phone]  The patient was seen, examined and discussed with Brittainy M. Strader, PA-C and I agree with the above.   74 year old male with persistent afib/ atypical atrial flutter, failed TEE/DCCV on 06/19/2015 due to not being able to pass the probe, successful on 06/23/2015. Today in SR, on Amiodarone and Xarelto. Will need to follow closely with Dr. Ladona Ridgel and afib clinic upon discharge, Follow up scheduled on 07/01/15 with Ronie Spies, we will also arrange for follow up with Dr Ladona Ridgel.  We will keep amiodarone at 400 until the next visit with Ronie Spies.  He will need BMP drawn in 1 week as his Crea 1.28 from 1.1. BP controlled. H/O chronic systolic CHF, appears euvolemic.   Lars Masson 06/24/2015

## 2015-06-24 NOTE — Care Management Note (Signed)
Case Management Note  Patient Details  Name: Thomas [name] MRN: [id number] Date of Birth: [birth year]  Subjective/Objective:  Pt admitted with Aflutter, pt will undergo cardioversion this admit                  Action/Plan:   Pt is independent from home with wife.  CM will provide HF screening.  CM will continue to monitor for disposition needs   Expected Discharge Date:                  Expected Discharge Plan:  Home w Home Health Services  In-House Referral:     Discharge planning Services  CM Consult  Post Acute Care Choice:    Choice offered to:     DME Arranged:    DME Agency:     HH Arranged:    HH Agency:     Status of Service:    Complete, will sign off  Medicare Important Message Given:  Yes-second notification given Date Medicare IM Given:    Medicare IM give by:    Date Additional Medicare IM Given:    Additional Medicare Important Message give by:     If discussed at Long Length of Stay Meetings, dates discussed:    Additional Comments: 06/24/2015 Raynald Blend, RN, BSN [phone] CM asked PT to evaluate pt prior to discharge, discharge orders have been signed.  PT communicated with CM post evaluation; no home health PT recommendations, however would benefit from continuing outpt PT (pt already active with outpt clinic for physical therapy prior to admit due to chronic back pain).  CM spoke with pt and wife; pt only stopped going to outpt PT due to hospital admission, pt will resume therapy post discharge.  No other CM Needs.  06/23/15 Raynald Blend, RN, BSN [phone] CM assessed pt with wife at bedside.  Pt stated he has scale but does not currently weigh self everyday, CM educated pt on importance of daily weights with prompt PCP follow up with sudden weigh gains, pt stated that he tries to adhere to low sodium diet, CM educated on the dangers of high salt content in a HF patient.   Cherylann Parr, RN 06/24/2015, 3:05 PM

## 2015-06-24 NOTE — Progress Notes (Signed)
PT Cancellation Note  Patient Details Name: Thomas [name] MRN: [id number] DOB: 1941-07-01   Cancelled Treatment:    Reason Eval/Treat Not Completed: Pain limiting ability to participate. Pt very painful due to foley. Pt to have catheter out this morning - will return to complete eval when catheter removed and pt has had pain meds.    Conni Slipper 06/24/2015, 10:16 AM   Conni Slipper, PT, DPT Acute Rehabilitation Services Pager: [phone]

## 2015-06-24 NOTE — Discharge Summary (Signed)
CARDIOLOGY DISCHARGE SUMMARY   Patient ID: Thomas [name] MRN: [id number] DOB/AGE: 1941-04-16 74 y.o.  Admit date: 06/17/2015 Discharge date: 06/24/2015  PCP: Thomas Canterbury, MD Primary Cardiologist: Dr. Ladona [name] Dr. Eldridge [name]  Primary Discharge Diagnosis: Atrial flutter with rapid ventricular response Secondary Discharge Diagnosis: Diabetes mellitus, Essential hypertension, Mixed hyperlipidemia, Acute on chronic diastolic CHF (congestive heart failure), NYHA class 1, Acute renal failure, OSA on CPAP, Constipation  Consults: Nephrology, Urology  Procedures: Transthoracic Echocardiogram, Transesophageal Echocardiogram, Direct Current Cardioversion, Esophogram/Barium Swallow  Hospital Course: Thomas [name] is a 74 y.o. male with past medical history of CAD (multiple stent placements - most recent 2008), chronic diastolic CHF, PAD, HTN, HLD, OSA (On CPAP) and Atrial Flutter (s/p ablation 8 years ago) who was found to be back in atrial flutter by Dr. Ladona [name] on 06/11/2015. He had begun to develop symptoms of shortness of breath, orthopnea and chest pain with HR reported into the 140's. He was initially scheduled for TEE/DCCV on 06/17/2015 but the procedure was postponed due to his worsening acute on chronic diastolic heart failure.  He was admitted and started on IV Lasix 80mg  BID that same day. He continued to have some chest pain overnight that did not improve with NTG. PRN Morphine and PRN Ativan were added to his medications at time for it was thought his chest pain was likely related to his atrial fibrillation with rate in the 110's - 120's at that time.   Diuresis was continued with the patient having an output of -2.8L during the first day. Continued to have shortness of breath in the evening hours of 06/18/2015 and a CXR was obtained at that time which showed cardiomegaly with no focal acute findings. He was kept NPO overnight for possible TEE/DCCV the next day.  On 06/19/2015 he was taken to the  endoscopy suite for his scheduled procedure but despite appropriate anesthesia with propofol, Dr. Eden [name] was unable to pass the probe past 25cm and could not carry out the procedure at that time. A barium swallow was recommended to evaluate for potential obstructive causes. Esophogram on 06/19/2015 showed no esophageal diverticulum or strictures.  Thought to be secondary to diuresis, the patient's creatinine increased from 1.2 to 3.05 on 06/19/2015. A nephrology consult was requested and they recommended a renal ultrasound which showed no significant pathology and that his ACE-I be stopped. Lasix also continued to be held for he did not appear overly volume overloaded.  The patient also began to experience abdominal pain and constipation. He had been kept NPO for two nights in anticipation of procedures and voiced consuming less amounts of food on the days he could eat. An abdominal X-ray showed barium and stool in the right colon and a diffuselt dilated colon most consistent with colonic ileus.  On 06/21/2015, the patient pulled his catheter out while the balloon was inflated and a Urology consult was requested due to the patient having gross hematuria and urethral trauma. A large catheter and clot irrigation was performed on 06/21/2015 by Dr. Berneice [name] and he recommended the catheter stay in place until after the patient's TEE/DCCV. Repeat TEE/DCCV had been scheduled for 06/23/2015.  On the morning of 06/23/2015 he was still having some abdominal pain, but noted it had improved after having a bowel movement and flatulence over the weekend. He also noted a great amount of penile pain due to his catheter placement. He was taken for TEE/DCCV that afternoon and a successful cardioversion was performed at 120J with the patient returning to  NSR. He tolerated the procedure well and no complications were noted.  On the morning of 06/24/2015 he reported still having some dyspnea. A CXR was ordered which showed peribronchial  thickening but no opacities or effusions. Also, Dr. Berneice [name] was contacted and said the patient was able to have his foley removed by nursing staff and could follow-up with him on an as-needed basis. He did urinate prior to discharge after the foley was removed.  Patient was last examined by Dr. Delton [name] and deemed stable for discharge. She suggested continuing his Amiodarone at 400mg  daily for now. He will also continue with Cardizem CD 120mg  daily, Metoprolol 75mg  daily, and Xarelto 20mg  daily. Lisinopril was not restarted due to his elevated creatinine of 1.28. He will need a BMET at his hospital follow-up appointment with Thomas Spies, PA-C on 07/01/2015 and his Amiodarone dosage will also need to be assessed at that visit. He also has scheduled follow-up with the A-fib clinic on that day and with Dr. Ladona [name] on 07/15/2015.  Labs:   Lab Results  Component Value Date   WBC 12.3* 06/22/2015   HGB 12.0* 06/22/2015   HCT 36.0* 06/22/2015   MCV 95.7 06/22/2015   PLT 202 06/22/2015     Recent Labs Lab 06/24/15 0441  NA 140  K 4.9  CL 101  CO2 31  BUN 25*  CREATININE 1.28*  CALCIUM 9.1  GLUCOSE 62*   Radiology: Dg Chest 2 View: 06/24/2015   CLINICAL DATA:  Respiratory difficulty.  EXAM: CHEST  2 VIEW  COMPARISON:  06/18/2015  FINDINGS: There is cardiomegaly. Peribronchial thickening. No confluent opacities or effusions. No acute bony abnormality.  IMPRESSION: Bronchitic changes.   Electronically Signed   By: Thomas [name] M.D.   On: 06/24/2015 10:57   Dg Chest 2 View: 06/18/2015   CLINICAL DATA:  Shortness of breath for 1 day  EXAM: CHEST  2 VIEW  COMPARISON:  12/30/2013  FINDINGS: Moderate enlargement of the cardiomediastinal silhouette is noted. Central vascular congestion without focal edema or pulmonary opacity. No pleural effusion. No acute osseous abnormality.  IMPRESSION: Cardiomegaly without focal acute finding.   Electronically Signed   By: Thomas [name] M.D.   On: 06/18/2015 18:36   Dg  Esophagus: 06/19/2015   CLINICAL DATA:  74 year old inpatient male, unable to pass transesophageal echocardiogram probe earlier today. Query diverticulum. Initial encounter.  EXAM: ESOPHOGRAM/BARIUM SWALLOW  TECHNIQUE: Single contrast examination was performed using thin barium or water soluble.  FLUOROSCOPY TIME:  Radiation Exposure Index (as provided by the fluoroscopic device):  If the device does not provide the exposure index:  Fluoroscopy Time:  1 minutes 24 seconds  Number of Acquired Images:  0  COMPARISON:  Chest CT 11/24/2006.  FINDINGS: Limited patient mobility. Study performed with the patient supine on the plus fluoroscopy table inclined at 25 degrees.  He tolerated the study without difficulty.  No obstruction to the forward flow of contrast throughout the esophagus and into the stomach.  There is a somewhat tortuous appearance of the upper cervical esophagus best seen on series 1, image 55. Beyond that, frequent tertiary contractions and mildly tortuous appearance of the mid thoracic esophagus is noted. No evidence of esophageal diverticulum. Gastroesophageal junction within normal limits.  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   Electronically Signed   By: Odessa Fleming M.D.   On: 06/19/2015 13:12   US Renal: 06/19/2015   CLINICAL DATA:  Acute renal failure  EXAM: RENAL ULTRASOUND  COMPARISON:  December 08, 2010  FINDINGS: Right Kidney:  Length: 14.3 cm. Echogenicity and renal echogenicity are within normal limits. No mass, perinephric fluid, or hydronephrosis visualized. No sonographically demonstrable calculus or ureterectasis.  Left Kidney:  Length: 13.6 cm. Echogenicity and renal cortical thickness are within normal limits. No perinephric fluid or hydronephrosis visualized. There is a stable 1.7 x 1.5 cm lower pole renal cyst. No other mass seen. No sonographically demonstrable calculus or  ureterectasis.  Bladder:  Decompressed with Foley catheter and cannot be assessed.  IMPRESSION: Stable small cyst lower pole left kidney. Kidney is otherwise appear unremarkable and stable.   Electronically Signed   By: Bretta Bang III M.D.   On: 06/19/2015 15:37   Dg Abd Portable 1v: 06/20/2015   CLINICAL DATA:  Abdominal pain.  Obstipation.  EXAM: PORTABLE ABDOMEN - 1 VIEW  COMPARISON:  09/16/2013  FINDINGS: Barium in the right colon. Retained stool in the right colon. The right and transverse colon and proximal left colon are dilated. The lower left colon and rectum are nondilated. No dilatation of the small bowel.  Right hip replacement.  No acute bony abnormality.  IMPRESSION: Barium and stool in the right colon. The colon is diffusely dilated most consistent with colonic ileus. Left colonic obstruction not excluded.   Electronically Signed   By: Marlan Palau M.D.   On: 06/20/2015 12:04    Transesophageal Echocardiogram - 06/23/2015 Indications: atrial fib   Procedure Details Consent: Obtained Time Out: Verified patient identification, verified procedure, site/side was marked, verified correct patient position, special equipment/implants available, Radiology Safety Procedures followed, medications/allergies/relevent history reviewed, required imaging and test results available. Performed  The patient was scheduled for TEE guided cardioversion last week but the procedure was aborted due to difficulty in passing the scope. A barium swallow did not reveal any structural abnormalities and the procedure was rescheduled for today    Medications: Propofol 65 mg total  Etomidate 4 mg iv Lidocaine 100 mg iv Neosynephrine 80 mcg    Left Ventrical: Moderate LV dysfunction - EF 35-40%  Mitral Valve: normal MV  Aortic Valve: normal AV  Tricuspid Valve: normal TV Pulmonic Valve: normal  Left Atrium/ Left atrial appendage: no thrombus  Atrial septum: No PFO by color doppler  Aorta:  clacified    Complications: No apparent complications. There was a scant amount of blood suctioned out of his throat following the procedure  Patient did tolerate procedure well.   Procedure: DC Cardioversion - 06/24/2015 Indications: atrial fib   Procedure Details Consent: Obtained Time Out: Verified patient identification, verified procedure, site/side was marked, verified correct patient position, special equipment/implants available, Radiology Safety Procedures followed, medications/allergies/relevent history reviewed, required imaging and test results available. Performed  The patient has been on adequate anticoagulation. The patient received IV propofol ( [name] above ) for sedation. Synchronous cardioversion was performed at 120 joules.  The cardioversion was successful   Complications: No apparent complications Patient did tolerate procedure well.   FOLLOW UP PLANS AND APPOINTMENTS Allergies  Allergen Reactions  . Ambien [Zolpidem] Other ([name] Comments)    Sleep walking  . Nsaids Diarrhea     Medication List    STOP taking these medications        diltiazem 30 MG tablet  Commonly known as:  CARDIZEM     lisinopril 40 MG tablet  Commonly known as:  PRINIVIL,ZESTRIL      TAKE these medications  acetaminophen 325 MG tablet  Commonly known as:  TYLENOL  Take 2 tablets (650 mg total) by mouth every 4 (four) hours as needed for fever.     amiodarone 400 MG tablet  Commonly known as:  PACERONE  Take 1 tablet (400 mg total) by mouth daily.     aspirin EC 81 MG tablet  Take 1 tablet (81 mg total) by mouth daily.     atorvastatin 20 MG tablet  Commonly known as:  LIPITOR  Take 1 tablet (20 mg total) by mouth daily.     diltiazem 120 MG 24 hr capsule  Commonly known as:  CARDIZEM CD  Take 120 mg by mouth daily.     fenofibrate micronized 134 MG capsule  Commonly known as:  LOFIBRA  Take 1 capsule (134 mg total) by mouth daily before breakfast.      FLORASTOR 250 MG capsule  Generic drug:  saccharomyces boulardii  Take 250 mg by mouth daily.     gabapentin 300 MG capsule  Commonly known as:  NEURONTIN  Take 300 mg by mouth 3 (three) times daily.     HUMULIN R 500 UNIT/ML injection  Generic drug:  insulin regular human CONCENTRATED  Inject 70-150 Units into the skin 2 (two) times daily with a meal. Inject 150 units into the skin at breakfast and 70 units into the skin at supper     metFORMIN 1000 MG tablet  Commonly known as:  GLUCOPHAGE  Take 1,000 mg by mouth 2 (two) times daily.     metoprolol succinate 25 MG 24 hr tablet  Commonly known as:  TOPROL-XL  Take 3 tablets (75 mg total) by mouth daily.     NITROSTAT 0.4 MG SL tablet  Generic drug:  nitroGLYCERIN  TAKE 1 TABLET AS DIRECTED BY PRESCRIBER if needed for CHEST PAIN     nitroGLYCERIN 0.4 MG SL tablet  Commonly known as:  NITROSTAT  Place 1 tablet (0.4 mg total) under the tongue every 5 (five) minutes as needed for chest pain.     oxyCODONE-acetaminophen 5-325 MG per tablet  Commonly known as:  PERCOCET/ROXICET  Take 1 tablet by mouth 2 (two) times daily as needed for severe pain.     rivaroxaban 20 MG Tabs tablet  Commonly known as:  XARELTO  Take 1 tablet (20 mg total) by mouth daily with supper.     temazepam 15 MG capsule  Commonly known as:  RESTORIL  Take 15 mg by mouth at bedtime.         Follow-up Information    Follow up with Thomas Spies, PA-C On 07/01/2015.   Specialties:  Cardiology, Radiology   Why:  Cardiology follow-up with Thomas Spies, PA-C on 07/01/2015 at 3:15PM   Contact information:   [address] [place] [address] [phone]       Follow up with Rudi Coco, NP On 07/01/2015.   Specialties:  Nurse Practitioner, Cardiology   Why:  Atrial Fibrillation Clinic Appointment on 07/01/2015 at 2:00PM. Located at [facility]   Contact information:   [address] [place] [address] [phone]       Follow up with Lewayne Bunting, MD On 07/15/2015.   Specialty:  Cardiology   Why:  Cardiology Follow-Up with Dr. Ladona [name] on 07/15/2015 at 9:00AM.   Contact information:   [address] [place] [address] [phone]       Follow up  with Sebastian Ache, MD.   Specialty:  Urology   Why:  Urologist - Can follow-up on an as needed basis.   Contact information:   [address] [place] [address] [phone]       BRING ALL MEDICATIONS WITH YOU TO FOLLOW UP APPOINTMENTS  Time spent with patient to include physician time: 55 minutes Signed: Ellsworth Lennox, PA 06/24/2015, 3:46 PM Co-Sign MD

## 2015-06-24 NOTE — Progress Notes (Signed)
Pt status post cardioversion this admission for a-flutter with rvr. Pt noted to be in sinus rhythm this shift, rate in the 60s. No episodes of dysrhythmia noted. Foley catheter was discontinued at 1115am. Pt complained of slight discomfort when foley catheter was removed. He has voided x 2 since catheter removed. He has been stable for discharge. Telemetry and piv were discontinued. Discharge instructions explained to pt and spouse. They asked questions and verbalized understanding. Pt was discharged to home at 1545.

## 2015-06-25 ENCOUNTER — Encounter (HOSPITAL_COMMUNITY): Payer: Self-pay | Admitting: Cardiovascular Disease

## 2015-07-01 ENCOUNTER — Encounter: Payer: Medicare Other | Admitting: Physician Assistant

## 2015-07-01 ENCOUNTER — Encounter (HOSPITAL_COMMUNITY): Payer: Medicare Other | Admitting: Nurse Practitioner

## 2015-07-01 ENCOUNTER — Encounter: Payer: Self-pay | Admitting: Physician Assistant

## 2015-07-01 ENCOUNTER — Telehealth (HOSPITAL_COMMUNITY): Payer: Self-pay | Admitting: *Deleted

## 2015-07-01 DIAGNOSIS — I5042 Chronic combined systolic (congestive) and diastolic (congestive) heart failure: Secondary | ICD-10-CM | POA: Insufficient documentation

## 2015-07-01 NOTE — Telephone Encounter (Signed)
Pt called in stating he had been admitted to hospital in [place] and would be receiving follow up care at [facility].

## 2015-07-01 NOTE — Progress Notes (Deleted)
Cardiology Office Note Date:  07/01/2015  Patient ID:  Thomas, [name] 09/18/1941, MRN [id number] PCP:  [name], MD  Cardiologist: Dr. Eldridge Dace Electrophysiologist: Dr. Ladona Ridgel  ***refresh   Chief Complaint: follow-up recent hospitalization for CHF/atrial fib  History of Present Illness: Thomas [name] is a 74 y.o. male with history of CAD (multiple stent placements - most recent 2008), previously chronic diastolic CHF with recent reduction in EF (40-45% by 2D echo 9/7, 35-40% by TEE 06/23/15), DM, PAD, HTN, HLD, OSA (On CPAP) and paroxysmal atrial flutter (s/p ablation 8 years ago with recent recurrence of PAF/flutter) who presents for post-hospital follow-up after a complicated admission.   He was recently seen in the office 06/11/15 by Dr. Ladona Ridgel at which time he was back in atrial flutter and outpatient TEE/DCCV was scheduled along with initiation of oral amiodarone. He presented for this on 06/17/15 however, procedure was postponed due to worsening acute on chronic diastolic heart failure. Per EP notes he had both atrial fib and atrial flutter this admission. He had some chest pain felt 2/2 tachycardia and CHF. Troponins only 0.04, flat trend. On 9/8 he was taken down for TEE/DCCV but Dr. Eden Emms had difficulty passing the probe and aborted the procedure. F/u esophogram on 06/19/2015 showed no esophageal diverticulum or strictures. In the meantime he developed AKI with Cr from 1.2->3.05 felt 2/2 diuresis. Nephrology recommended renal US which showed no significant pathology and ACEI was stopped. Lasix was also held. He then developed issues with abdominal pain/constipiation and was noted to have colonic ileus  managed conservatively. On 06/21/15 the patient pulled out his foley catheter while the balloon was inflated - urology consult was requested due to the patient having gross hematuria and urethral trauma. A large catheter and clot irrigation was performed on 06/21/2015 by Dr. Berneice Heinrich and he  recommended the catheter stay in place until after the patient's TEE/DCCV. Repeat TEE/DCCV was successfully performed on 06/23/15 with return to NSR. Foley was eventually removed and urology recommended only PRN follow-up. The patient was started on amiodarone  daily during his stay. Lisinopril was not restarted due to Cr 1.28. Prior baseline Cr 06/10/15 was 0.88. Recent TSH and LFTs normal. Last 2D Echo 06/18/15: EF 40-45%, mod LAE, mild TR mildly increased PASP, elevated CVP. TEE 06/23/15: EF 35-40%, mod MR. [Last nuc 2014 showed "attenuation artifact in the inferior region of the myocardium. No ischemia or scar/infarct is seen in the remaining myocardium. Despite negative dress test if symptoms persist, would consider angiogram. EF 52%."]  repeat bmet today defer to GT about amiodarone dose not on acei due to recent aki carotids due ? LV dysfunction   Past Medical History  Diagnosis Date  . Diabetes mellitus   . CAD (coronary artery disease)     a. h/o multiple stent placements - most recent 2008.  Marland Kitchen Hypertension   . Claudication in peripheral vascular disease 07/08/2011    Left external iliac occlusion with collaterals; mild-moderate right external iliac disease; moderate calcified left popliteal disease  . Carotid artery disease     a. Carotid duplex 03/2014: 40-59% RICA, 1-39% LICA, >50% RECA..  . Hyperlipidemia   . AAA (abdominal aortic aneurysm)     a. ?Details unclear. Ct report 2011 - "Extensive atherosclerotic vascular disease is seen the descending abdominal aorta which measures up to 2.8 cm."  . Kidney stones   . Arthritis   . 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 Chronic combined systolic and  diastolic CHF (congestive heart failure)     a. Prev chronic diastolic CHF. b. LV dysfunction noted in 06/2015 - 40-45% by 2D echo and 35-40% by TEE in 06/2015.  Marland Kitchen Paroxysmal atrial flutter     a. s/p ablation 2008. b. Recurrence 05/2015 with TEE/DCCV 06/2015.    . OSA on CPAP   . Paroxysmal atrial fibrillation     a. Per notes 06/2015.  . Ileus     a. During 06/2015 admission.    Past Surgical History  Procedure Laterality Date  . Shoulder surgery      right  . Carpal tunnel release  2006    right   . Hip fracture surgery  11/2007    right  . Joint replacement  2009    right total hip replacement  . Coronary angioplasty with stent placement  05/30/2007    Mid RCA 3.0 x 15 mm vision BMS, distal RCA 2.5 x 12 mm vision BMS, PDA 2.0 x 12 mm vision BMS  . Tonsillectomy      as child  . Total hip revision Right 09/03/2013    Procedure: RIGHT TOTAL HIP REVISION;  Surgeon: Shelda Pal, MD;  Location: [facility] ORS;  Service: Orthopedics;  Laterality: Right;  . Atrial flutter ablation  11/03/2006  . Coronary angioplasty with stent placement  10/06/2007    LM 25, LAD 25, D1 50, D2 50, CFX mild dz, OM1 diffuse dz, OM 2 100 w/ L-L collaterals, mid-RCA stent with mild ISR, distal RCA stent with > 90 ISR, subtotal PL, Rx w/ 2.5 x 15-mm Promus DES. EF 55%,   . Tee without cardioversion N/A 06/23/2015    Procedure: TRANSESOPHAGEAL ECHOCARDIOGRAM (TEE);  Surgeon: Vesta Mixer, MD;  Location: [facility] ENDOSCOPY;  Service: Cardiovascular;  Laterality: N/A;  . Cardioversion N/A 06/23/2015    Procedure: CARDIOVERSION;  Surgeon: Vesta Mixer, MD;  Location: [facility] ENDOSCOPY;  Service: Cardiovascular;  Laterality: N/A;    Current Outpatient Prescriptions  Medication Sig Dispense Refill  . acetaminophen (TYLENOL) 325 MG tablet Take 2 tablets (650 mg total) by mouth every 4 (four) hours as needed for fever.    Marland Kitchen amiodarone (PACERONE) 400 MG tablet Take 1 tablet (400 mg total) by mouth daily. 30 tablet 6  . aspirin EC 81 MG tablet Take 1 tablet (81 mg total) by mouth daily. (Patient not taking: Reported on 06/12/2015)    . atorvastatin (LIPITOR) 20 MG tablet Take 1 tablet (20 mg total) by mouth daily. 30 tablet 0  . diltiazem (CARDIZEM CD) 120 MG 24 hr capsule Take 120 mg by  mouth daily.    . fenofibrate micronized (LOFIBRA) 134 MG capsule Take 1 capsule (134 mg total) by mouth daily before breakfast.    . gabapentin (NEURONTIN) 300 MG capsule Take 300 mg by mouth 3 (three) times daily.     . insulin regular human CONCENTRATED (HUMULIN R) 500 UNIT/ML injection Inject 70-150 Units into the skin 2 (two) times daily with a meal. Inject 150 units into the skin at breakfast and 70 units into the skin at supper    . metFORMIN (GLUCOPHAGE) 1000 MG tablet Take 1,000 mg by mouth 2 (two) times daily.     . metoprolol succinate (TOPROL-XL) 25 MG 24 hr tablet Take 3 tablets (75 mg total) by mouth daily. 30 tablet 6  . nitroGLYCERIN (NITROSTAT) 0.4 MG SL tablet Place 1 tablet (0.4 mg total) under the tongue every 5 (five) minutes as needed for chest pain. 25  tablet 0  . NITROSTAT 0.4 MG SL tablet TAKE 1 TABLET AS DIRECTED BY PRESCRIBER if needed for CHEST PAIN 25 tablet 5  . oxyCODONE-acetaminophen (PERCOCET/ROXICET) 5-325 MG per tablet Take 1 tablet by mouth 2 (two) times daily as needed for severe pain.    . rivaroxaban (XARELTO) 20 MG TABS tablet Take 1 tablet (20 mg total) by mouth daily with supper. 30 tablet 3  . saccharomyces boulardii (FLORASTOR) 250 MG capsule Take 250 mg by mouth daily.     . temazepam (RESTORIL) 15 MG capsule Take 15 mg by mouth at bedtime.      No current facility-administered medications for this visit.    Allergies:   Ambien and Nsaids   Social History:  The patient  reports that he quit smoking about 9 years ago. His smoking use included Cigarettes. He has never used smokeless tobacco. He reports that he drinks alcohol. He reports that he does not use illicit drugs.   Family History:  The patient's family history includes Cancer in his father; Diabetes in his son; Emphysema in his mother.***  ROS:  Please see the history of present illness. Otherwise, review of systems is positive for ***.   All other systems are reviewed and otherwise negative.    PHYSICAL EXAM: *** VS:  There were no vitals taken for this visit. BMI: There is no weight on file to calculate BMI. Well nourished, well developed, in no acute distress HEENT: normocephalic, atraumatic Neck: no JVD, carotid bruits or masses Cardiac:  normal S1, S2; RRR; no murmurs, rubs, or gallops Lungs:  clear to auscultation bilaterally, no wheezing, rhonchi or rales Abd: soft, nontender, no hepatomegaly, + BS MS: no deformity or atrophy Ext: no edema Skin: warm and dry, no rash Neuro:  moves all extremities spontaneously, no focal abnormalities noted, follows commands Psych: euthymic mood, full affect   EKG:  Done today shows ***  Recent Labs: 06/17/2015: ALT 31; TSH 1.085 06/22/2015: Hemoglobin 12.0*; Platelets 202 06/24/2015: BUN 25*; Creatinine, Ser 1.28*; Potassium 4.9; Sodium 140  09/12/2014: Cholesterol 171; Direct LDL 90.7; HDL 21.90*; Total CHOL/HDL Ratio 8; Triglycerides 490.0 Triglyceride is over 400; calculations on Lipids are invalid.*; VLDL 98.0*   Estimated Creatinine Clearance: 63.8 mL/min (by C-G formula based on Cr of 1.28).   Wt Readings from Last 3 Encounters:  06/24/15 220 lb 7.4 oz (100 kg)  06/11/15 217 lb 12.8 oz (98.793 kg)  06/10/15 218 lb (98.884 kg)     Other studies reviewed: Additional studies/records reviewed today include: summarized above***  ASSESSMENT AND PLAN:  1. Paroxysmal atrial flutter/atrial fibrillation 2. Chronic combined CHF with recently noted LV dysfunction 3. CAD s/p prior PCIs 4. Acute kidney injury -  5. Essential HTN 6. Carotid disease -   Disposition: F/u with ***  Current medicines are reviewed at length with the patient today.  The patient did not have any concerns regarding medicines.***  Signed, Thomas Spies PA-C 07/01/2015 1:02 PM     [facility] [address] [place] [address] [phone] (office)  [phone] (fax)

## 2015-07-01 NOTE — Progress Notes (Signed)
This encounter was created in error - please disregard.

## 2015-07-10 ENCOUNTER — Ambulatory Visit: Payer: Medicare Other | Admitting: Internal Medicine

## 2015-07-14 ENCOUNTER — Other Ambulatory Visit: Payer: Self-pay

## 2015-07-14 DIAGNOSIS — M1990 Unspecified osteoarthritis, unspecified site: Secondary | ICD-10-CM | POA: Insufficient documentation

## 2015-07-14 DIAGNOSIS — Z229 Carrier of infectious disease, unspecified: Secondary | ICD-10-CM | POA: Insufficient documentation

## 2015-07-15 ENCOUNTER — Ambulatory Visit: Payer: Medicare Other | Admitting: Internal Medicine

## 2015-07-16 ENCOUNTER — Encounter: Payer: Self-pay | Admitting: Internal Medicine

## 2015-07-28 ENCOUNTER — Other Ambulatory Visit: Payer: Self-pay

## 2015-07-28 MED ORDER — ATORVASTATIN CALCIUM 20 MG PO TABS: 20.0000 mg | ORAL_TABLET | Freq: Every day | ORAL | Status: AC

## 2015-09-30 ENCOUNTER — Other Ambulatory Visit: Payer: Self-pay | Admitting: *Deleted

## 2016-07-11 DEATH — deceased
